# Patient Record
Sex: Female | Born: 1940
Health system: Southern US, Community
[De-identification: ages and names within clinical notes are randomized; demographics above are authoritative.]

## PROBLEM LIST (undated history)

## (undated) DIAGNOSIS — I5181 Takotsubo syndrome: Secondary | ICD-10-CM

## (undated) DIAGNOSIS — K5790 Diverticulosis of intestine, part unspecified, without perforation or abscess without bleeding: Secondary | ICD-10-CM

## (undated) DIAGNOSIS — F0390 Unspecified dementia without behavioral disturbance: Secondary | ICD-10-CM

## (undated) DIAGNOSIS — K279 Peptic ulcer, site unspecified, unspecified as acute or chronic, without hemorrhage or perforation: Secondary | ICD-10-CM

## (undated) DIAGNOSIS — I219 Acute myocardial infarction, unspecified: Secondary | ICD-10-CM

## (undated) DIAGNOSIS — B029 Zoster without complications: Secondary | ICD-10-CM

## (undated) DIAGNOSIS — M1990 Unspecified osteoarthritis, unspecified site: Secondary | ICD-10-CM

## (undated) DIAGNOSIS — M549 Dorsalgia, unspecified: Secondary | ICD-10-CM

## (undated) DIAGNOSIS — M722 Plantar fascial fibromatosis: Secondary | ICD-10-CM

## (undated) DIAGNOSIS — F32A Depression, unspecified: Secondary | ICD-10-CM

## (undated) DIAGNOSIS — K635 Polyp of colon: Secondary | ICD-10-CM

## (undated) DIAGNOSIS — K219 Gastro-esophageal reflux disease without esophagitis: Secondary | ICD-10-CM

## (undated) DIAGNOSIS — N3281 Overactive bladder: Secondary | ICD-10-CM

## (undated) DIAGNOSIS — R251 Tremor, unspecified: Secondary | ICD-10-CM

## (undated) DIAGNOSIS — F419 Anxiety disorder, unspecified: Secondary | ICD-10-CM

## (undated) DIAGNOSIS — E785 Hyperlipidemia, unspecified: Secondary | ICD-10-CM

## (undated) DIAGNOSIS — F329 Major depressive disorder, single episode, unspecified: Secondary | ICD-10-CM

## (undated) DIAGNOSIS — I639 Cerebral infarction, unspecified: Secondary | ICD-10-CM

## (undated) DIAGNOSIS — M719 Bursopathy, unspecified: Secondary | ICD-10-CM

## (undated) DIAGNOSIS — M8580 Other specified disorders of bone density and structure, unspecified site: Secondary | ICD-10-CM

## (undated) DIAGNOSIS — K649 Unspecified hemorrhoids: Secondary | ICD-10-CM

## (undated) DIAGNOSIS — I10 Essential (primary) hypertension: Secondary | ICD-10-CM

## (undated) DIAGNOSIS — H8110 Benign paroxysmal vertigo, unspecified ear: Secondary | ICD-10-CM

## (undated) DIAGNOSIS — E559 Vitamin D deficiency, unspecified: Secondary | ICD-10-CM

## (undated) HISTORY — DX: Vitamin D deficiency, unspecified: E55.9

## (undated) HISTORY — DX: Dorsalgia, unspecified: M54.9

## (undated) HISTORY — DX: Bursopathy, unspecified: M71.9

## (undated) HISTORY — PX: FOOT SURGERY: SHX648

## (undated) HISTORY — DX: Anxiety disorder, unspecified: F41.9

## (undated) HISTORY — DX: Polyp of colon: K63.5

## (undated) HISTORY — PX: JOINT REPLACEMENT: SHX530

## (undated) HISTORY — DX: Essential (primary) hypertension: I10

## (undated) HISTORY — DX: Overactive bladder: N32.81

## (undated) HISTORY — PX: APPENDECTOMY: SHX54

## (undated) HISTORY — PX: POLYPECTOMY: SHX149

## (undated) HISTORY — PX: ROTATOR CUFF REPAIR: SHX139

## (undated) HISTORY — PX: HEMORRHOID SURGERY: SHX153

## (undated) HISTORY — PX: TONSILLECTOMY: SUR1361

## (undated) HISTORY — PX: COLONOSCOPY: SHX174

## (undated) HISTORY — DX: Diverticulosis of intestine, part unspecified, without perforation or abscess without bleeding: K57.90

## (undated) HISTORY — DX: Unspecified hemorrhoids: K64.9

## (undated) HISTORY — PX: OTHER SURGICAL HISTORY: SHX169

## (undated) HISTORY — DX: Other specified disorders of bone density and structure, unspecified site: M85.80

## (undated) HISTORY — DX: Peptic ulcer, site unspecified, unspecified as acute or chronic, without hemorrhage or perforation: K27.9

## (undated) HISTORY — PX: ABDOMINAL HYSTERECTOMY: SHX81

## (undated) HISTORY — DX: Tremor, unspecified: R25.1

## (undated) HISTORY — DX: Zoster without complications: B02.9

## (undated) HISTORY — PX: VAGOTOMY: SUR1431

## (undated) HISTORY — PX: BACK SURGERY: SHX140

## (undated) HISTORY — PX: TUBAL LIGATION: SHX77

---
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
## 2000-05-11 ENCOUNTER — Encounter: Payer: Self-pay | Admitting: Emergency Medicine

## 2000-05-11 ENCOUNTER — Emergency Department (HOSPITAL_COMMUNITY): Admission: EM | Admit: 2000-05-11 | Discharge: 2000-05-11 | Payer: Self-pay | Admitting: Emergency Medicine

## 2002-11-08 ENCOUNTER — Encounter: Payer: Self-pay | Admitting: Internal Medicine

## 2002-11-08 ENCOUNTER — Encounter: Admission: RE | Admit: 2002-11-08 | Discharge: 2002-11-08 | Payer: Self-pay | Admitting: Internal Medicine

## 2003-06-29 ENCOUNTER — Encounter: Payer: Self-pay | Admitting: Physician Assistant

## 2003-11-10 ENCOUNTER — Encounter: Admission: RE | Admit: 2003-11-10 | Discharge: 2003-11-10 | Payer: Self-pay | Admitting: Internal Medicine

## 2005-12-13 ENCOUNTER — Encounter: Admission: RE | Admit: 2005-12-13 | Discharge: 2005-12-13 | Payer: Self-pay | Admitting: Internal Medicine

## 2007-06-29 ENCOUNTER — Encounter: Admission: RE | Admit: 2007-06-29 | Discharge: 2007-06-29 | Payer: Self-pay | Admitting: Internal Medicine

## 2009-03-03 ENCOUNTER — Encounter: Admission: RE | Admit: 2009-03-03 | Discharge: 2009-03-03 | Payer: Self-pay | Admitting: Internal Medicine

## 2009-07-18 ENCOUNTER — Ambulatory Visit: Payer: Self-pay | Admitting: Internal Medicine

## 2009-07-25 ENCOUNTER — Telehealth: Payer: Self-pay | Admitting: Internal Medicine

## 2009-07-27 ENCOUNTER — Telehealth: Payer: Self-pay | Admitting: Internal Medicine

## 2009-07-31 ENCOUNTER — Ambulatory Visit: Payer: Self-pay | Admitting: Internal Medicine

## 2009-07-31 ENCOUNTER — Ambulatory Visit (HOSPITAL_COMMUNITY): Admission: RE | Admit: 2009-07-31 | Discharge: 2009-07-31 | Payer: Self-pay | Admitting: Internal Medicine

## 2009-07-31 DIAGNOSIS — Z860100 Personal history of colon polyps, unspecified: Secondary | ICD-10-CM | POA: Insufficient documentation

## 2009-07-31 DIAGNOSIS — R112 Nausea with vomiting, unspecified: Secondary | ICD-10-CM | POA: Insufficient documentation

## 2009-07-31 DIAGNOSIS — R1013 Epigastric pain: Secondary | ICD-10-CM | POA: Insufficient documentation

## 2009-07-31 DIAGNOSIS — K644 Residual hemorrhoidal skin tags: Secondary | ICD-10-CM | POA: Insufficient documentation

## 2009-07-31 DIAGNOSIS — K648 Other hemorrhoids: Secondary | ICD-10-CM | POA: Insufficient documentation

## 2009-07-31 DIAGNOSIS — K625 Hemorrhage of anus and rectum: Secondary | ICD-10-CM | POA: Insufficient documentation

## 2009-07-31 DIAGNOSIS — K5730 Diverticulosis of large intestine without perforation or abscess without bleeding: Secondary | ICD-10-CM | POA: Insufficient documentation

## 2009-08-01 ENCOUNTER — Encounter: Payer: Self-pay | Admitting: Internal Medicine

## 2009-08-01 ENCOUNTER — Telehealth: Payer: Self-pay | Admitting: Internal Medicine

## 2009-08-01 DIAGNOSIS — R110 Nausea: Secondary | ICD-10-CM

## 2009-08-01 LAB — CONVERTED CEMR LAB
Basophils Absolute: 0.1 10*3/uL (ref 0.0–0.1)
Chloride: 99 meq/L (ref 96–112)
GFR calc non Af Amer: 66.33 mL/min (ref >60)
HCT: 42.4 % (ref 36.0–46.0)
Hemoglobin: 14.5 g/dL (ref 12.0–15.0)
Lymphocytes Relative: 34.6 % (ref 12.0–46.0)
Lymphs Abs: 2.5 10*3/uL (ref 0.7–4.0)
Platelets: 257 10*3/uL (ref 150.0–400.0)
Potassium: 4.1 meq/L (ref 3.5–5.1)
RBC: 4.55 M/uL (ref 3.87–5.11)
Sodium: 136 meq/L (ref 135–145)
WBC: 7.2 10*3/uL (ref 4.5–10.5)

## 2009-08-02 ENCOUNTER — Telehealth: Payer: Self-pay | Admitting: Physician Assistant

## 2009-08-08 ENCOUNTER — Telehealth (INDEPENDENT_AMBULATORY_CARE_PROVIDER_SITE_OTHER): Payer: Self-pay | Admitting: *Deleted

## 2009-08-15 ENCOUNTER — Ambulatory Visit (HOSPITAL_COMMUNITY): Admission: RE | Admit: 2009-08-15 | Discharge: 2009-08-15 | Payer: Self-pay | Admitting: Internal Medicine

## 2009-10-23 ENCOUNTER — Encounter: Payer: Self-pay | Admitting: Internal Medicine

## 2009-11-06 ENCOUNTER — Encounter (INDEPENDENT_AMBULATORY_CARE_PROVIDER_SITE_OTHER): Payer: Self-pay

## 2009-11-09 ENCOUNTER — Ambulatory Visit: Payer: Self-pay | Admitting: Internal Medicine

## 2009-11-23 ENCOUNTER — Ambulatory Visit: Payer: Self-pay | Admitting: Internal Medicine

## 2009-11-24 ENCOUNTER — Encounter: Payer: Self-pay | Admitting: Internal Medicine

## 2009-12-06 ENCOUNTER — Encounter: Payer: Self-pay | Admitting: Internal Medicine

## 2009-12-06 ENCOUNTER — Ambulatory Visit (HOSPITAL_COMMUNITY): Admission: RE | Admit: 2009-12-06 | Discharge: 2009-12-07 | Payer: Self-pay | Admitting: General Surgery

## 2009-12-06 ENCOUNTER — Encounter (INDEPENDENT_AMBULATORY_CARE_PROVIDER_SITE_OTHER): Payer: Self-pay | Admitting: General Surgery

## 2010-01-01 ENCOUNTER — Encounter: Payer: Self-pay | Admitting: Internal Medicine

## 2010-11-11 ENCOUNTER — Encounter: Payer: Self-pay | Admitting: Internal Medicine

## 2010-11-20 NOTE — Op Note (Signed)
Summary: [facility] Surgery   Imported By: Lester [place] 12/14/2009 09:12:51  _____________________________________________________________________  External Attachment:    Type:   Image     Comment:   External Document

## 2010-11-20 NOTE — Procedures (Signed)
Summary: Colonoscopy  Patient: [name] Note: All result statuses are Final unless otherwise noted.  Tests: (1) Colonoscopy (COL)   COL Colonoscopy           DONE      [facility]     [address]     [place], [place]  [address]           COLONOSCOPY PROCEDURE REPORT           PATIENT:  Colleen Glenn, Colleen Glenn  MR#:  [id number]     BIRTHDATE:  1941/02/13, 68 yrs. old  GENDER:  female           ENDOSCOPIST:  Wilhemina Bonito. Eda Keys, MD     Referred by:  Surveillance Program Recall           PROCEDURE DATE:  11/23/2009     PROCEDURE:  Colonoscopy with snare polypectomy x 2     ASA CLASS:  Class II     INDICATIONS:  history of polyps (index 06-2003 w/hp and ? type)           MEDICATIONS:   Fentanyl 100 mcg IV, Versed 10 mg IV           DESCRIPTION OF PROCEDURE:   After the risks benefits and     alternatives of the procedure were thoroughly explained, informed     consent was obtained.  Digital rectal exam was performed and     revealed prolasped hemorrhoids.   The LB CF-H180AL [id number]     endoscope was introduced through the anus and advanced to the     cecum, which was identified by both the appendix and ileocecal     valve, without limitations.Time to cecum = 5:41 min.  The quality     of the prep was excellent, using MoviPrep.  The instrument was     then slowly withdrawn (time = 14:37 min) as the colon was fully     examined.     <<PROCEDUREIMAGES>>           FINDINGS:  Two polyps were found in the ascending colon (5mm and     2mm). Polyps were snared without cautery. Retrieval was     successful.   Mild diverticulosis was found in the sigmoid colon.     Retroflexed views in the rectum revealed internal hemorrhoids.     The scope was then withdrawn from the patient and the procedure     completed.           COMPLICATIONS:  None           ENDOSCOPIC IMPRESSION:     1) Two polyps in the ascending colon -removed     2) Mild diverticulosis in the sigmoid colon     3)  Internal hemorrhoids           RECOMMENDATIONS:     1) Follow up colonoscopy in 5 years           ______________________________     Wilhemina Bonito. Eda Keys, MD           CC:  Rodrigo Ran, MD; The Patient           n.     eSIGNED:   Wilhemina Bonito. Eda Keys at 11/23/2009 10:55 AM           Leanna Battles, [id number]  Note: An exclamation mark (!) indicates a result that was not dispersed into the flowsheet. Document Creation Date: 11/23/2009 10:55  AM _______________________________________________________________________  (1) Order result status: Final Collection or observation date-time: 11/23/2009 10:47 Requested date-time:  Receipt date-time:  Reported date-time:  Referring Physician:   Ordering Physician: Fransico Setters [phone]) Specimen Source:  Source: Launa Grill Order Number: [id number] Lab site:   Appended Document: Colonoscopy recall     Procedures Next Due Date:    Colonoscopy: 11/2014

## 2010-11-20 NOTE — Letter (Signed)
Summary: [facility] Surgery   Imported By: Sherian Rein 01/10/2010 13:53:08  _____________________________________________________________________  External Attachment:    Type:   Image     Comment:   External Document

## 2010-11-20 NOTE — Letter (Signed)
Summary: Bleeding Hemorrhoids/CCS  Bleeding Hemorrhoids/CCS   Imported By: Sherian Rein 11/08/2009 08:08:19  _____________________________________________________________________  External Attachment:    Type:   Image     Comment:   External Document

## 2010-11-20 NOTE — Letter (Signed)
Summary: Patient Notice- Polyp Results  [facility]  [address] [place], [place] [address]   Phone: [phone]  Fax: [phone]        November 24, 2009 MRN: [id number]    [facility] [address] [place], [place]  [address]    Dear Ms. Boffa,  I am pleased to inform you that the colon polyp(s) removed during your recent colonoscopy was (were) found to be benign (no cancer detected) upon pathologic examination.  I recommend you have a repeat colonoscopy examination in 5 years to look for recurrent polyps, as having colon polyps increases your risk for having recurrent polyps or even colon cancer in the future.  Should you develop new or worsening symptoms of abdominal pain, bowel habit changes or bleeding from the rectum or bowels, please schedule an evaluation with either your primary care physician or with me.  Additional information/recommendations:  __ No further action with gastroenterology is needed at this time. Please      follow-up with your primary care physician for your other healthcare      needs.   Please call us if you are having persistent problems or have questions about your condition that have not been fully answered at this time.  Sincerely,  Hilarie Fredrickson MD  This letter has been electronically signed by your physician.  Appended Document: Patient Notice- Polyp Results Letter mailed 2.10.11

## 2010-11-20 NOTE — Letter (Signed)
Summary: [facility] Instructions  [facility]  [address] [place], [place] [address]   Phone: [phone]  Fax: [phone]       Colleen Glenn    12/27/1940    MRN: [id number]        Procedure Day /Date:  Thursday 11/23/2009     Arrival Time: 9:30 am      Procedure Time: 10:30 am     Location of Procedure:                    _x _  [facility] (4th Floor)  PREPARATION FOR COLONOSCOPY WITH MOVIPREP   Starting 5 days prior to your procedure Saturday 1/29 do not eat nuts, seeds, popcorn, corn, beans, peas,  salads, or any raw vegetables.  Do not take any fiber supplements (e.g. Metamucil, Citrucel, and Benefiber).  THE DAY BEFORE YOUR PROCEDURE         DATE: Wednesday 2/2  1.  Drink clear liquids the entire day-NO SOLID FOOD  2.  Do not drink anything colored red or purple.  Avoid juices with pulp.  No orange juice.  3.  Drink at least 64 oz. (8 glasses) of fluid/clear liquids during the day to prevent dehydration and help the prep work efficiently.  CLEAR LIQUIDS INCLUDE: Water Jello Ice Popsicles Tea (sugar ok, no milk/cream) Powdered fruit flavored drinks Coffee (sugar ok, no milk/cream) Gatorade Juice: apple, white grape, white cranberry  Lemonade Clear bullion, consomm, broth Carbonated beverages (any kind) Strained chicken noodle soup Hard Candy                             4.  In the morning, mix first dose of MoviPrep solution:    Empty 1 Pouch A and 1 Pouch B into the disposable container    Add lukewarm drinking water to the top line of the container. Mix to dissolve    Refrigerate (mixed solution should be used within 24 hrs)  5.  Begin drinking the prep at 5:00 p.m. The MoviPrep container is divided by 4 marks.   Every 15 minutes drink the solution down to the next mark (approximately 8 oz) until the full liter is complete.   6.  Follow completed prep with 16 oz of clear liquid of your choice (Nothing red or purple).   Continue to drink clear liquids until bedtime.  7.  Before going to bed, mix second dose of MoviPrep solution:    Empty 1 Pouch A and 1 Pouch B into the disposable container    Add lukewarm drinking water to the top line of the container. Mix to dissolve    Refrigerate  THE DAY OF YOUR PROCEDURE      DATE: Thursday 2/3  Beginning at 5:30 am (5 hours before procedure):         1. Every 15 minutes, drink the solution down to the next mark (approx 8 oz) until the full liter is complete.  2. Follow completed prep with 16 oz. of clear liquid of your choice.    3. You may drink clear liquids until 8:30 am (2 HOURS BEFORE PROCEDURE).   MEDICATION INSTRUCTIONS  Unless otherwise instructed, you should take regular prescription medications with a small sip of water   as early as possible the morning of your procedure.      Additional medication instructions: _Hold Maxzide the AM of procedure  OTHER INSTRUCTIONS  You will need a responsible adult at least 69 years of age to accompany you and drive you home.   This person must remain in the waiting room during your procedure.  Wear loose fitting clothing that is easily removed.  Leave jewelry and other valuables at home.  However, you may wish to bring a book to read or  an iPod/MP3 player to listen to music as you wait for your procedure to start.  Remove all body piercing jewelry and leave at home.  Total time from sign-in until discharge is approximately 2-3 hours.  You should go home directly after your procedure and rest.  You can resume normal activities the  day after your procedure.  The day of your procedure you should not:   Drive   Make legal decisions   Operate machinery   Drink alcohol   Return to work  You will receive specific instructions about eating, activities and medications before you leave.    The above instructions have been reviewed and explained to me by   Doristine Church RN II   November 09, 2009 2:40 PM     I fully understand and can verbalize these instructions _____________________________ Date _________

## 2010-11-20 NOTE — Miscellaneous (Signed)
Summary: [facility] previsit-prep  Clinical Lists Changes  Observations: Added new observation of ALLERGY REV: Done (11/09/2009 14:13) Added new observation of NKA: T (11/09/2009 14:13)

## 2011-01-09 LAB — COMPREHENSIVE METABOLIC PANEL
AST: 25 U/L (ref 0–37)
Albumin: 3.7 g/dL (ref 3.5–5.2)
BUN: 16 mg/dL (ref 6–23)
CO2: 27 mEq/L (ref 19–32)
Creatinine, Ser: 0.78 mg/dL (ref 0.4–1.2)
Glucose, Bld: 85 mg/dL (ref 70–99)
Sodium: 142 mEq/L (ref 135–145)
Total Bilirubin: 0.5 mg/dL (ref 0.3–1.2)

## 2011-01-09 LAB — DIFFERENTIAL
Basophils Relative: 0 % (ref 0–1)
Eosinophils Relative: 3 % (ref 0–5)
Lymphocytes Relative: 46 % (ref 12–46)
Neutro Abs: 3.1 10*3/uL (ref 1.7–7.7)

## 2011-01-09 LAB — CBC
Hemoglobin: 13.5 g/dL (ref 12.0–15.0)
MCHC: 34 g/dL (ref 30.0–36.0)
MCV: 94 fL (ref 78.0–100.0)
Platelets: 232 10*3/uL (ref 150–400)
RDW: 14.2 % (ref 11.5–15.5)
WBC: 7 10*3/uL (ref 4.0–10.5)

## 2011-01-09 LAB — URINALYSIS, ROUTINE W REFLEX MICROSCOPIC
Glucose, UA: NEGATIVE mg/dL
Specific Gravity, Urine: 1.017 (ref 1.005–1.030)
pH: 7.5 (ref 5.0–8.0)

## 2011-04-26 ENCOUNTER — Other Ambulatory Visit: Payer: Self-pay | Admitting: Internal Medicine

## 2011-04-26 DIAGNOSIS — M5410 Radiculopathy, site unspecified: Secondary | ICD-10-CM

## 2011-04-26 DIAGNOSIS — M545 Low back pain: Secondary | ICD-10-CM

## 2011-05-02 ENCOUNTER — Ambulatory Visit
Admission: RE | Admit: 2011-05-02 | Discharge: 2011-05-02 | Disposition: A | Discharge status: Home / Self Care | Payer: Medicare Other | Source: Ambulatory Visit | Attending: Internal Medicine | Admitting: Internal Medicine

## 2011-05-02 DIAGNOSIS — M545 Low back pain: Secondary | ICD-10-CM

## 2011-05-02 DIAGNOSIS — M5410 Radiculopathy, site unspecified: Secondary | ICD-10-CM

## 2011-05-02 MED ORDER — GADOBENATE DIMEGLUMINE 529 MG/ML IV SOLN
15.0000 mL | Freq: Once | INTRAVENOUS | Status: AC | PRN
  Administered 2011-05-02: 15 mL via INTRAVENOUS

## 2011-10-24 DIAGNOSIS — M19019 Primary osteoarthritis, unspecified shoulder: Secondary | ICD-10-CM | POA: Diagnosis not present

## 2011-10-29 DIAGNOSIS — M75120 Complete rotator cuff tear or rupture of unspecified shoulder, not specified as traumatic: Secondary | ICD-10-CM | POA: Diagnosis not present

## 2011-11-18 DIAGNOSIS — Y929 Unspecified place or not applicable: Secondary | ICD-10-CM | POA: Diagnosis not present

## 2011-11-18 DIAGNOSIS — M19019 Primary osteoarthritis, unspecified shoulder: Secondary | ICD-10-CM | POA: Diagnosis not present

## 2011-11-18 DIAGNOSIS — M948X9 Other specified disorders of cartilage, unspecified sites: Secondary | ICD-10-CM | POA: Diagnosis not present

## 2011-11-18 DIAGNOSIS — S46819A Strain of other muscles, fascia and tendons at shoulder and upper arm level, unspecified arm, initial encounter: Secondary | ICD-10-CM | POA: Diagnosis not present

## 2011-11-18 DIAGNOSIS — M75120 Complete rotator cuff tear or rupture of unspecified shoulder, not specified as traumatic: Secondary | ICD-10-CM | POA: Diagnosis not present

## 2011-11-18 DIAGNOSIS — S46919A Strain of unspecified muscle, fascia and tendon at shoulder and upper arm level, unspecified arm, initial encounter: Secondary | ICD-10-CM | POA: Diagnosis not present

## 2011-11-18 DIAGNOSIS — X58XXXA Exposure to other specified factors, initial encounter: Secondary | ICD-10-CM | POA: Diagnosis not present

## 2011-11-18 DIAGNOSIS — M75100 Unspecified rotator cuff tear or rupture of unspecified shoulder, not specified as traumatic: Secondary | ICD-10-CM | POA: Diagnosis not present

## 2011-11-26 DIAGNOSIS — Z4789 Encounter for other orthopedic aftercare: Secondary | ICD-10-CM | POA: Diagnosis not present

## 2011-11-26 DIAGNOSIS — M19019 Primary osteoarthritis, unspecified shoulder: Secondary | ICD-10-CM | POA: Diagnosis not present

## 2011-11-26 DIAGNOSIS — M75120 Complete rotator cuff tear or rupture of unspecified shoulder, not specified as traumatic: Secondary | ICD-10-CM | POA: Diagnosis not present

## 2011-12-09 DIAGNOSIS — M75120 Complete rotator cuff tear or rupture of unspecified shoulder, not specified as traumatic: Secondary | ICD-10-CM | POA: Diagnosis not present

## 2011-12-12 DIAGNOSIS — M75120 Complete rotator cuff tear or rupture of unspecified shoulder, not specified as traumatic: Secondary | ICD-10-CM | POA: Diagnosis not present

## 2011-12-20 DIAGNOSIS — M75120 Complete rotator cuff tear or rupture of unspecified shoulder, not specified as traumatic: Secondary | ICD-10-CM | POA: Diagnosis not present

## 2011-12-24 DIAGNOSIS — M75120 Complete rotator cuff tear or rupture of unspecified shoulder, not specified as traumatic: Secondary | ICD-10-CM | POA: Diagnosis not present

## 2011-12-26 DIAGNOSIS — M75120 Complete rotator cuff tear or rupture of unspecified shoulder, not specified as traumatic: Secondary | ICD-10-CM | POA: Diagnosis not present

## 2011-12-30 DIAGNOSIS — M75120 Complete rotator cuff tear or rupture of unspecified shoulder, not specified as traumatic: Secondary | ICD-10-CM | POA: Diagnosis not present

## 2012-01-02 DIAGNOSIS — M75120 Complete rotator cuff tear or rupture of unspecified shoulder, not specified as traumatic: Secondary | ICD-10-CM | POA: Diagnosis not present

## 2012-01-08 DIAGNOSIS — M75120 Complete rotator cuff tear or rupture of unspecified shoulder, not specified as traumatic: Secondary | ICD-10-CM | POA: Diagnosis not present

## 2012-01-10 DIAGNOSIS — M75120 Complete rotator cuff tear or rupture of unspecified shoulder, not specified as traumatic: Secondary | ICD-10-CM | POA: Diagnosis not present

## 2012-01-13 DIAGNOSIS — M75120 Complete rotator cuff tear or rupture of unspecified shoulder, not specified as traumatic: Secondary | ICD-10-CM | POA: Diagnosis not present

## 2012-01-16 DIAGNOSIS — M75120 Complete rotator cuff tear or rupture of unspecified shoulder, not specified as traumatic: Secondary | ICD-10-CM | POA: Diagnosis not present

## 2012-01-20 DIAGNOSIS — M75120 Complete rotator cuff tear or rupture of unspecified shoulder, not specified as traumatic: Secondary | ICD-10-CM | POA: Diagnosis not present

## 2012-01-22 DIAGNOSIS — M75120 Complete rotator cuff tear or rupture of unspecified shoulder, not specified as traumatic: Secondary | ICD-10-CM | POA: Diagnosis not present

## 2012-01-29 DIAGNOSIS — M75120 Complete rotator cuff tear or rupture of unspecified shoulder, not specified as traumatic: Secondary | ICD-10-CM | POA: Diagnosis not present

## 2012-02-03 DIAGNOSIS — M75120 Complete rotator cuff tear or rupture of unspecified shoulder, not specified as traumatic: Secondary | ICD-10-CM | POA: Diagnosis not present

## 2012-02-05 DIAGNOSIS — M75120 Complete rotator cuff tear or rupture of unspecified shoulder, not specified as traumatic: Secondary | ICD-10-CM | POA: Diagnosis not present

## 2012-02-10 DIAGNOSIS — M75120 Complete rotator cuff tear or rupture of unspecified shoulder, not specified as traumatic: Secondary | ICD-10-CM | POA: Diagnosis not present

## 2012-02-12 DIAGNOSIS — M75120 Complete rotator cuff tear or rupture of unspecified shoulder, not specified as traumatic: Secondary | ICD-10-CM | POA: Diagnosis not present

## 2012-02-19 DIAGNOSIS — M75120 Complete rotator cuff tear or rupture of unspecified shoulder, not specified as traumatic: Secondary | ICD-10-CM | POA: Diagnosis not present

## 2012-02-24 DIAGNOSIS — M75120 Complete rotator cuff tear or rupture of unspecified shoulder, not specified as traumatic: Secondary | ICD-10-CM | POA: Diagnosis not present

## 2012-02-26 DIAGNOSIS — M75120 Complete rotator cuff tear or rupture of unspecified shoulder, not specified as traumatic: Secondary | ICD-10-CM | POA: Diagnosis not present

## 2012-03-02 DIAGNOSIS — M75120 Complete rotator cuff tear or rupture of unspecified shoulder, not specified as traumatic: Secondary | ICD-10-CM | POA: Diagnosis not present

## 2012-03-04 DIAGNOSIS — M75120 Complete rotator cuff tear or rupture of unspecified shoulder, not specified as traumatic: Secondary | ICD-10-CM | POA: Diagnosis not present

## 2012-03-24 DIAGNOSIS — M19019 Primary osteoarthritis, unspecified shoulder: Secondary | ICD-10-CM | POA: Diagnosis not present

## 2012-03-24 DIAGNOSIS — M75120 Complete rotator cuff tear or rupture of unspecified shoulder, not specified as traumatic: Secondary | ICD-10-CM | POA: Diagnosis not present

## 2012-03-24 DIAGNOSIS — Z4789 Encounter for other orthopedic aftercare: Secondary | ICD-10-CM | POA: Diagnosis not present

## 2012-04-17 ENCOUNTER — Other Ambulatory Visit: Payer: Self-pay | Admitting: Neurological Surgery

## 2012-04-17 DIAGNOSIS — IMO0002 Reserved for concepts with insufficient information to code with codable children: Secondary | ICD-10-CM | POA: Diagnosis not present

## 2012-04-25 ENCOUNTER — Ambulatory Visit
Admission: RE | Admit: 2012-04-25 | Discharge: 2012-04-25 | Disposition: A | Discharge status: Home / Self Care | Payer: Medicare Other | Source: Ambulatory Visit | Attending: Neurological Surgery | Admitting: Neurological Surgery

## 2012-04-25 DIAGNOSIS — M5137 Other intervertebral disc degeneration, lumbosacral region: Secondary | ICD-10-CM | POA: Diagnosis not present

## 2012-04-25 DIAGNOSIS — M47817 Spondylosis without myelopathy or radiculopathy, lumbosacral region: Secondary | ICD-10-CM | POA: Diagnosis not present

## 2012-04-25 DIAGNOSIS — IMO0002 Reserved for concepts with insufficient information to code with codable children: Secondary | ICD-10-CM

## 2012-04-25 DIAGNOSIS — M5126 Other intervertebral disc displacement, lumbar region: Secondary | ICD-10-CM | POA: Diagnosis not present

## 2012-05-01 ENCOUNTER — Other Ambulatory Visit: Payer: Self-pay | Admitting: Neurological Surgery

## 2012-05-01 DIAGNOSIS — M48061 Spinal stenosis, lumbar region without neurogenic claudication: Secondary | ICD-10-CM | POA: Diagnosis not present

## 2012-05-04 ENCOUNTER — Encounter (HOSPITAL_COMMUNITY): Payer: Self-pay | Admitting: Pharmacy Technician

## 2012-05-04 NOTE — Pre-Procedure Instructions (Addendum)
20 Colleen Glenn   05/04/2012   Your procedure is scheduled on: July 18th, Thursday   Report to [facility] at  8:45 AM.   Call this number if you have problems the morning of surgery: [phone]   Remember:   Do not eat food or drink any liquids:After Midnight Wednesday.    Take these medicines the morning of surgery with a SIP OF WATER: Omeprazole,              Wellbutrin                                                                                                            Do not wear jewelry, make-up or nail polish.   Do not wear lotions, powders, or perfumes. Do not shave 48 hrs prior               To surgery.                                                                                                                                                                                                                                    DO NOT BRING ANY VALUABLES TO THE HOSPITAL  Contacts, dentures or bridgework may not be worn into surgery.   Leave suitcase in the car. After surgery it may be brought to your room.  For patients admitted to the hospital, checkout time is 11:00 AM the day of discharge.   Patients discharged the day of surgery will not be allowed to drive home.  Name and phone number of your driver:  Special Instructions: CHG Shower Use Special Wash: 1/2 bottle night before surgery and 1/2 bottle morning of surgery.   Please read over the following fact sheets that you were given: Pain Booklet, MRSA Information and Surgical Site Infection Prevention

## 2012-05-05 ENCOUNTER — Encounter (HOSPITAL_COMMUNITY)
Admission: RE | Admit: 2012-05-05 | Discharge: 2012-05-05 | Disposition: A | Discharge status: Home / Self Care | Payer: Medicare Other | Source: Ambulatory Visit | Attending: Neurological Surgery | Admitting: Neurological Surgery

## 2012-05-05 ENCOUNTER — Encounter (HOSPITAL_COMMUNITY): Payer: Self-pay

## 2012-05-05 DIAGNOSIS — E785 Hyperlipidemia, unspecified: Secondary | ICD-10-CM | POA: Diagnosis not present

## 2012-05-05 DIAGNOSIS — Z01811 Encounter for preprocedural respiratory examination: Secondary | ICD-10-CM | POA: Diagnosis not present

## 2012-05-05 DIAGNOSIS — M48061 Spinal stenosis, lumbar region without neurogenic claudication: Secondary | ICD-10-CM | POA: Diagnosis not present

## 2012-05-05 DIAGNOSIS — M5126 Other intervertebral disc displacement, lumbar region: Secondary | ICD-10-CM | POA: Diagnosis not present

## 2012-05-05 DIAGNOSIS — K219 Gastro-esophageal reflux disease without esophagitis: Secondary | ICD-10-CM | POA: Diagnosis not present

## 2012-05-05 DIAGNOSIS — I10 Essential (primary) hypertension: Secondary | ICD-10-CM | POA: Diagnosis not present

## 2012-05-05 HISTORY — DX: Plantar fascial fibromatosis: M72.2

## 2012-05-05 HISTORY — DX: Gastro-esophageal reflux disease without esophagitis: K21.9

## 2012-05-05 HISTORY — DX: Depression, unspecified: F32.A

## 2012-05-05 HISTORY — DX: Unspecified osteoarthritis, unspecified site: M19.90

## 2012-05-05 HISTORY — DX: Major depressive disorder, single episode, unspecified: F32.9

## 2012-05-05 HISTORY — DX: Hyperlipidemia, unspecified: E78.5

## 2012-05-05 LAB — BASIC METABOLIC PANEL
CO2: 28 mEq/L (ref 19–32)
Calcium: 9.1 mg/dL (ref 8.4–10.5)
Creatinine, Ser: 0.74 mg/dL (ref 0.50–1.10)
Glucose, Bld: 103 mg/dL — ABNORMAL HIGH (ref 70–99)

## 2012-05-05 LAB — DIFFERENTIAL
Basophils Absolute: 0 10*3/uL (ref 0.0–0.1)
Basophils Relative: 0 % (ref 0–1)
Eosinophils Absolute: 0.2 10*3/uL (ref 0.0–0.7)
Eosinophils Relative: 2 % (ref 0–5)

## 2012-05-05 LAB — SURGICAL PCR SCREEN: Staphylococcus aureus: NEGATIVE

## 2012-05-05 LAB — CBC
MCH: 31.1 pg (ref 26.0–34.0)
MCV: 93.3 fL (ref 78.0–100.0)
Platelets: 266 10*3/uL (ref 150–400)
RDW: 13.8 % (ref 11.5–15.5)
WBC: 8 10*3/uL (ref 4.0–10.5)

## 2012-05-05 LAB — PROTIME-INR: Prothrombin Time: 13.3 seconds (ref 11.6–15.2)

## 2012-05-06 MED ORDER — CEFAZOLIN SODIUM-DEXTROSE 2-3 GM-% IV SOLR
2.0000 g | INTRAVENOUS | Status: AC
  Administered 2012-05-07: 2 g via INTRAVENOUS
  Filled 2012-05-06: qty 50

## 2012-05-07 ENCOUNTER — Inpatient Hospital Stay (HOSPITAL_COMMUNITY): Payer: Medicare Other

## 2012-05-07 ENCOUNTER — Encounter (HOSPITAL_COMMUNITY): Payer: Self-pay | Admitting: Anesthesiology

## 2012-05-07 ENCOUNTER — Inpatient Hospital Stay (HOSPITAL_COMMUNITY)
Admission: RE | Admit: 2012-05-07 | Discharge: 2012-05-07 | DRG: 491 | Disposition: A | Discharge status: Home / Self Care | Payer: Medicare Other | Source: Ambulatory Visit | Attending: Neurological Surgery | Admitting: Neurological Surgery

## 2012-05-07 ENCOUNTER — Encounter (HOSPITAL_COMMUNITY)
Admission: RE | Disposition: A | Discharge status: Home / Self Care | Payer: Self-pay | Source: Ambulatory Visit | Attending: Neurological Surgery

## 2012-05-07 ENCOUNTER — Encounter (HOSPITAL_COMMUNITY): Payer: Self-pay | Admitting: *Deleted

## 2012-05-07 ENCOUNTER — Inpatient Hospital Stay (HOSPITAL_COMMUNITY): Payer: Medicare Other | Admitting: Anesthesiology

## 2012-05-07 DIAGNOSIS — E785 Hyperlipidemia, unspecified: Secondary | ICD-10-CM | POA: Diagnosis not present

## 2012-05-07 DIAGNOSIS — Z87891 Personal history of nicotine dependence: Secondary | ICD-10-CM

## 2012-05-07 DIAGNOSIS — F329 Major depressive disorder, single episode, unspecified: Secondary | ICD-10-CM | POA: Diagnosis present

## 2012-05-07 DIAGNOSIS — I10 Essential (primary) hypertension: Secondary | ICD-10-CM | POA: Diagnosis present

## 2012-05-07 DIAGNOSIS — M48061 Spinal stenosis, lumbar region without neurogenic claudication: Secondary | ICD-10-CM | POA: Diagnosis not present

## 2012-05-07 DIAGNOSIS — Z7982 Long term (current) use of aspirin: Secondary | ICD-10-CM

## 2012-05-07 DIAGNOSIS — Z79899 Other long term (current) drug therapy: Secondary | ICD-10-CM

## 2012-05-07 DIAGNOSIS — F3289 Other specified depressive episodes: Secondary | ICD-10-CM | POA: Diagnosis present

## 2012-05-07 DIAGNOSIS — Z981 Arthrodesis status: Secondary | ICD-10-CM | POA: Diagnosis not present

## 2012-05-07 DIAGNOSIS — M79609 Pain in unspecified limb: Secondary | ICD-10-CM | POA: Diagnosis not present

## 2012-05-07 DIAGNOSIS — K219 Gastro-esophageal reflux disease without esophagitis: Secondary | ICD-10-CM | POA: Diagnosis not present

## 2012-05-07 HISTORY — PX: LUMBAR LAMINECTOMY/DECOMPRESSION MICRODISCECTOMY: SHX5026

## 2012-05-07 SURGERY — LUMBAR LAMINECTOMY/DECOMPRESSION MICRODISCECTOMY 1 LEVEL
Anesthesia: General | Site: Back | Laterality: Right | Wound class: Clean

## 2012-05-07 MED ORDER — BUPROPION HCL ER (XL) 300 MG PO TB24: 300.0000 mg | ORAL_TABLET | Freq: Every day | ORAL | Status: DC

## 2012-05-07 MED ORDER — BUPIVACAINE HCL (PF) 0.25 % IJ SOLN
INTRAMUSCULAR | Status: DC | PRN
  Administered 2012-05-07: 16 mL

## 2012-05-07 MED ORDER — MENTHOL 3 MG MT LOZG: 1.0000 | LOZENGE | OROMUCOSAL | Status: DC | PRN

## 2012-05-07 MED ORDER — HEMOSTATIC AGENTS (NO CHARGE) OPTIME
TOPICAL | Status: DC | PRN
  Administered 2012-05-07: 1 via TOPICAL

## 2012-05-07 MED ORDER — ONDANSETRON HCL 4 MG/2ML IJ SOLN
INTRAMUSCULAR | Status: DC | PRN
  Administered 2012-05-07: 4 mg via INTRAVENOUS

## 2012-05-07 MED ORDER — ACETAMINOPHEN 325 MG PO TABS: 650.0000 mg | ORAL_TABLET | ORAL | Status: DC | PRN

## 2012-05-07 MED ORDER — DEXAMETHASONE SODIUM PHOSPHATE 10 MG/ML IJ SOLN: 10.0000 mg | INTRAMUSCULAR | Status: DC

## 2012-05-07 MED ORDER — PHENOL 1.4 % MT LIQD: 1.0000 | OROMUCOSAL | Status: DC | PRN

## 2012-05-07 MED ORDER — ACETAMINOPHEN 10 MG/ML IV SOLN
INTRAVENOUS | Status: AC
  Administered 2012-05-07: 1000 mg via INTRAVENOUS
  Filled 2012-05-07: qty 100

## 2012-05-07 MED ORDER — FENTANYL CITRATE 0.05 MG/ML IJ SOLN
INTRAMUSCULAR | Status: DC | PRN
  Administered 2012-05-07: 175 ug via INTRAVENOUS

## 2012-05-07 MED ORDER — MIDAZOLAM HCL 5 MG/5ML IJ SOLN
INTRAMUSCULAR | Status: DC | PRN
  Administered 2012-05-07: 2 mg via INTRAVENOUS

## 2012-05-07 MED ORDER — ONDANSETRON HCL 4 MG/2ML IJ SOLN: 4.0000 mg | Freq: Once | INTRAMUSCULAR | Status: DC | PRN

## 2012-05-07 MED ORDER — MORPHINE SULFATE 2 MG/ML IJ SOLN: 1.0000 mg | INTRAMUSCULAR | Status: DC | PRN

## 2012-05-07 MED ORDER — GLYCOPYRROLATE 0.2 MG/ML IJ SOLN
INTRAMUSCULAR | Status: DC | PRN
  Administered 2012-05-07: 0.4 mg via INTRAVENOUS

## 2012-05-07 MED ORDER — LIDOCAINE HCL (CARDIAC) 20 MG/ML IV SOLN
INTRAVENOUS | Status: DC | PRN
  Administered 2012-05-07: 100 mg via INTRAVENOUS

## 2012-05-07 MED ORDER — NEOSTIGMINE METHYLSULFATE 1 MG/ML IJ SOLN
INTRAMUSCULAR | Status: DC | PRN
  Administered 2012-05-07: 3 mg via INTRAVENOUS

## 2012-05-07 MED ORDER — HYDROCODONE-ACETAMINOPHEN 5-325 MG PO TABS
1.0000 | ORAL_TABLET | ORAL | Status: DC | PRN
  Administered 2012-05-07: 2 via ORAL
  Filled 2012-05-07: qty 2

## 2012-05-07 MED ORDER — BACITRACIN 50000 UNITS IM SOLR
INTRAMUSCULAR | Status: AC
  Filled 2012-05-07: qty 1

## 2012-05-07 MED ORDER — CEFAZOLIN SODIUM 1-5 GM-% IV SOLN
1.0000 g | Freq: Three times a day (TID) | INTRAVENOUS | Status: DC
  Filled 2012-05-07 (×2): qty 50

## 2012-05-07 MED ORDER — SODIUM CHLORIDE 0.9 % IV SOLN
INTRAVENOUS | Status: AC
  Filled 2012-05-07: qty 500

## 2012-05-07 MED ORDER — ASPIRIN EC 81 MG PO TBEC
81.0000 mg | DELAYED_RELEASE_TABLET | Freq: Every day | ORAL | Status: DC
  Filled 2012-05-07: qty 1

## 2012-05-07 MED ORDER — ACETAMINOPHEN 650 MG RE SUPP: 650.0000 mg | RECTAL | Status: DC | PRN

## 2012-05-07 MED ORDER — PROPOFOL 10 MG/ML IV EMUL
INTRAVENOUS | Status: DC | PRN
  Administered 2012-05-07: 130 mg via INTRAVENOUS

## 2012-05-07 MED ORDER — SODIUM CHLORIDE 0.9 % IJ SOLN: 3.0000 mL | INTRAMUSCULAR | Status: DC | PRN

## 2012-05-07 MED ORDER — SODIUM CHLORIDE 0.9 % IR SOLN
Status: DC | PRN
  Administered 2012-05-07: 10:00:00

## 2012-05-07 MED ORDER — ONDANSETRON HCL 4 MG/2ML IJ SOLN: 4.0000 mg | INTRAMUSCULAR | Status: DC | PRN

## 2012-05-07 MED ORDER — THROMBIN 5000 UNITS EX SOLR
CUTANEOUS | Status: DC | PRN
  Administered 2012-05-07: 5000 [IU] via TOPICAL

## 2012-05-07 MED ORDER — ZOLPIDEM TARTRATE 5 MG PO TABS: 5.0000 mg | ORAL_TABLET | Freq: Every evening | ORAL | Status: DC | PRN

## 2012-05-07 MED ORDER — LACTATED RINGERS IV SOLN
INTRAVENOUS | Status: DC | PRN
  Administered 2012-05-07 (×2): via INTRAVENOUS

## 2012-05-07 MED ORDER — PHENYLEPHRINE HCL 10 MG/ML IJ SOLN
INTRAMUSCULAR | Status: DC | PRN
  Administered 2012-05-07 (×2): 80 ug via INTRAVENOUS

## 2012-05-07 MED ORDER — RALOXIFENE HCL 60 MG PO TABS
60.0000 mg | ORAL_TABLET | Freq: Every day | ORAL | Status: DC
  Filled 2012-05-07: qty 1

## 2012-05-07 MED ORDER — CYCLOBENZAPRINE HCL 10 MG PO TABS: 10.0000 mg | ORAL_TABLET | Freq: Three times a day (TID) | ORAL | Status: DC | PRN

## 2012-05-07 MED ORDER — DEXAMETHASONE SODIUM PHOSPHATE 10 MG/ML IJ SOLN
INTRAMUSCULAR | Status: AC
  Administered 2012-05-07: 10 mg via INTRAVENOUS
  Filled 2012-05-07: qty 1

## 2012-05-07 MED ORDER — EPHEDRINE SULFATE 50 MG/ML IJ SOLN
INTRAMUSCULAR | Status: DC | PRN
  Administered 2012-05-07: 10 mg via INTRAVENOUS
  Administered 2012-05-07: 5 mg via INTRAVENOUS

## 2012-05-07 MED ORDER — HYDROMORPHONE HCL PF 1 MG/ML IJ SOLN
0.2500 mg | INTRAMUSCULAR | Status: DC | PRN
  Administered 2012-05-07: 0.5 mg via INTRAVENOUS

## 2012-05-07 MED ORDER — ROCURONIUM BROMIDE 100 MG/10ML IV SOLN
INTRAVENOUS | Status: DC | PRN
  Administered 2012-05-07: 50 mg via INTRAVENOUS

## 2012-05-07 MED ORDER — TRIAMTERENE-HCTZ 37.5-25 MG PO TABS
1.0000 | ORAL_TABLET | Freq: Every day | ORAL | Status: DC
  Filled 2012-05-07: qty 1

## 2012-05-07 MED ORDER — HYDROMORPHONE HCL PF 1 MG/ML IJ SOLN
INTRAMUSCULAR | Status: AC
  Filled 2012-05-07: qty 1

## 2012-05-07 MED ORDER — SODIUM CHLORIDE 0.9 % IJ SOLN: 3.0000 mL | Freq: Two times a day (BID) | INTRAMUSCULAR | Status: DC

## 2012-05-07 MED ORDER — DEXTROSE 5 % IV SOLN
INTRAVENOUS | Status: DC | PRN
  Administered 2012-05-07: 10:00:00 via INTRAVENOUS

## 2012-05-07 MED ORDER — POTASSIUM CHLORIDE IN NACL 20-0.9 MEQ/L-% IV SOLN
INTRAVENOUS | Status: DC
  Filled 2012-05-07 (×2): qty 1000

## 2012-05-07 MED ORDER — 0.9 % SODIUM CHLORIDE (POUR BTL) OPTIME
TOPICAL | Status: DC | PRN
  Administered 2012-05-07: 1000 mL

## 2012-05-07 MED ORDER — SENNA 8.6 MG PO TABS: 1.0000 | ORAL_TABLET | Freq: Two times a day (BID) | ORAL | Status: DC

## 2012-05-07 SURGICAL SUPPLY — 48 items
APL SKNCLS STERI-STRIP NONHPOA (GAUZE/BANDAGES/DRESSINGS) ×1
BAG DECANTER FOR FLEXI CONT (MISCELLANEOUS) ×2 IMPLANT
BENZOIN TINCTURE PRP APPL 2/3 (GAUZE/BANDAGES/DRESSINGS) ×2 IMPLANT
BUR MATCHSTICK NEURO 3.0 LAGG (BURR) ×2 IMPLANT
CANISTER SUCTION 2500CC (MISCELLANEOUS) ×2 IMPLANT
CLOTH BEACON ORANGE TIMEOUT ST (SAFETY) ×2 IMPLANT
CONT SPEC 4OZ CLIKSEAL STRL BL (MISCELLANEOUS) ×2 IMPLANT
DRAPE LAPAROTOMY 100X72X124 (DRAPES) ×2 IMPLANT
DRAPE MICROSCOPE ZEISS OPMI (DRAPES) ×2 IMPLANT
DRAPE POUCH INSTRU U-SHP 10X18 (DRAPES) ×2 IMPLANT
DRAPE PROXIMA HALF (DRAPES) ×1 IMPLANT
DRAPE SURG 17X23 STRL (DRAPES) ×2 IMPLANT
DRESSING TELFA 8X3 (GAUZE/BANDAGES/DRESSINGS) ×2 IMPLANT
DRSG OPSITE 4X5.5 SM (GAUZE/BANDAGES/DRESSINGS) ×2 IMPLANT
DURAPREP 26ML APPLICATOR (WOUND CARE) ×2 IMPLANT
ELECT REM PT RETURN 9FT ADLT (ELECTROSURGICAL) ×2
ELECTRODE REM PT RTRN 9FT ADLT (ELECTROSURGICAL) ×1 IMPLANT
GAUZE SPONGE 4X4 16PLY XRAY LF (GAUZE/BANDAGES/DRESSINGS) IMPLANT
GLOVE BIO SURGEON STRL SZ8 (GLOVE) ×2 IMPLANT
GLOVE BIOGEL PI IND STRL 7.0 (GLOVE) IMPLANT
GLOVE BIOGEL PI INDICATOR 7.0 (GLOVE) ×2
GLOVE ECLIPSE 7.5 STRL STRAW (GLOVE) ×1 IMPLANT
GLOVE SS BIOGEL STRL SZ 6.5 (GLOVE) IMPLANT
GLOVE SUPERSENSE BIOGEL SZ 6.5 (GLOVE) ×2
GOWN BRE IMP SLV AUR LG STRL (GOWN DISPOSABLE) ×2 IMPLANT
GOWN BRE IMP SLV AUR XL STRL (GOWN DISPOSABLE) ×3 IMPLANT
GOWN STRL REIN 2XL LVL4 (GOWN DISPOSABLE) IMPLANT
HEMOSTAT POWDER KIT SURGIFOAM (HEMOSTASIS) IMPLANT
KIT BASIN OR (CUSTOM PROCEDURE TRAY) ×2 IMPLANT
KIT ROOM TURNOVER OR (KITS) ×2 IMPLANT
NDL HYPO 25X1 1.5 SAFETY (NEEDLE) ×1 IMPLANT
NDL SPNL 20GX3.5 QUINCKE YW (NEEDLE) IMPLANT
NEEDLE HYPO 25X1 1.5 SAFETY (NEEDLE) ×2 IMPLANT
NEEDLE SPNL 20GX3.5 QUINCKE YW (NEEDLE) ×2 IMPLANT
NS IRRIG 1000ML POUR BTL (IV SOLUTION) ×2 IMPLANT
PACK LAMINECTOMY NEURO (CUSTOM PROCEDURE TRAY) ×2 IMPLANT
PAD ARMBOARD 7.5X6 YLW CONV (MISCELLANEOUS) ×6 IMPLANT
RUBBERBAND STERILE (MISCELLANEOUS) ×4 IMPLANT
SPONGE SURGIFOAM ABS GEL SZ50 (HEMOSTASIS) ×2 IMPLANT
STRIP CLOSURE SKIN 1/2X4 (GAUZE/BANDAGES/DRESSINGS) ×2 IMPLANT
SUT VIC AB 0 CT1 18XCR BRD8 (SUTURE) ×1 IMPLANT
SUT VIC AB 0 CT1 8-18 (SUTURE) ×2
SUT VIC AB 2-0 CP2 18 (SUTURE) ×2 IMPLANT
SUT VIC AB 3-0 SH 8-18 (SUTURE) ×3 IMPLANT
SYR 20ML ECCENTRIC (SYRINGE) ×2 IMPLANT
TOWEL OR 17X24 6PK STRL BLUE (TOWEL DISPOSABLE) ×2 IMPLANT
TOWEL OR 17X26 10 PK STRL BLUE (TOWEL DISPOSABLE) ×2 IMPLANT
WATER STERILE IRR 1000ML POUR (IV SOLUTION) ×2 IMPLANT

## 2012-05-07 NOTE — Op Note (Signed)
05/07/2012  11:20 AM  PATIENT:  Colleen Glenn  71 y.o. female  PRE-OPERATIVE DIAGNOSIS:  Lumbar stenosis L3-4 with right leg pain  POST-OPERATIVE DIAGNOSIS:  Same  PROCEDURE:  Right L3-4 hemilaminectomy, medial facetectomy, and foraminotomies for decompression of the right L4 nerve root  SURGEON:  Marikay Alar, MD  ASSISTANTS: Dr. Phoebe Perch  ANESTHESIA:   General  EBL: 20 ml  Total I/O In: 1150 [I.V.:1150] Out: 20 [Blood:20]  BLOOD ADMINISTERED:none  DRAINS: None   SPECIMEN:  No Specimen  INDICATION FOR PROCEDURE: This patient present with right leg pain in the quadricep. She had an MRI which showed spinal stenosis at L3-4. She tried medical management without relief. Recommended decompressive hemilaminectomy at L4 on the right. Patient understood the risks, benefits, and alternatives and potential outcomes and wished to proceed.  PROCEDURE DETAILS: The patient was taken to the operating room and after induction of adequate generalized endotracheal anesthesia, the patient was rolled into the prone position on the Wilson frame and all pressure points were padded. The lumbar region was cleaned and then prepped with DuraPrep and draped in the usual sterile fashion. 5 cc of local anesthesia was injected and then a dorsal midline incision was made and carried down to the lumbo sacral fascia. The fascia was opened and the paraspinous musculature was taken down in a subperiosteal fashion to expose L3-4 on the right. Intraoperative x-ray confirmed my level, and then I used a combination of the high-speed drill and the Kerrison punches to perform a hemilaminectomy, medial facetectomy, and foraminotomy at L3-4 on the right. The underlying yellow ligament was opened and removed in a piecemeal fashion to expose the underlying dura and exiting nerve root. I undercut the lateral recess and dissected down until I was medial to and distal to the pedicle. The nerve root was well decompressed. We then  gently retracted the nerve root medially and inspected the disc space to make sure there was no disc herniation. A palpated with a coronary dilator and I felt no more compression of the nerve root. I irrigated with saline solution containing bacitracin. Achieved hemostasis with bipolar cautery, lined the dura with Gelfoam, and then closed the fascia with 0 Vicryl. I closed the subcutaneous tissues with 2-0 Vicryl and the subcuticular tissues with 3-0 Vicryl. The skin was then closed with benzoin and Steri-Strips. The drapes were removed, a sterile dressing was applied. The patient was awakened from general anesthesia and transferred to the recovery room in stable condition. At the end of the procedure all sponge, needle and instrument counts were correct.   PLAN OF CARE: Admit to inpatient   PATIENT DISPOSITION:  PACU - hemodynamically stable.   Delay start of Pharmacological VTE agent (>24hrs) due to surgical blood loss or risk of bleeding:  yes              -

## 2012-05-07 NOTE — Progress Notes (Signed)
UR COMPLETED  

## 2012-05-07 NOTE — Preoperative (Signed)
Beta Blockers   Reason not to administer Beta Blockers:Not Applicable 

## 2012-05-07 NOTE — Anesthesia Procedure Notes (Signed)
Procedure Name: Intubation Date/Time: 05/07/2012 9:49 AM Performed by: Tyrone Nine Pre-anesthesia Checklist: Patient identified, Timeout performed, Emergency Drugs available, Suction available and Patient being monitored Patient Re-evaluated:Patient Re-evaluated prior to inductionOxygen Delivery Method: Circle system utilized Preoxygenation: Pre-oxygenation with 100% oxygen Intubation Type: IV induction Ventilation: Mask ventilation without difficulty Laryngoscope Size: Mac and 3 Grade View: Grade I Tube type: Oral Tube size: 8.0 mm Number of attempts: 1 Airway Equipment and Method: Patient positioned with wedge pillow and Stylet Placement Confirmation: ETT inserted through vocal cords under direct vision,  breath sounds checked- equal and bilateral,  positive ETCO2 and CO2 detector Secured at: 22 cm Tube secured with: Tape Dental Injury: Teeth and Oropharynx as per pre-operative assessment

## 2012-05-07 NOTE — Progress Notes (Signed)
Pt. Alert and oriented,follows simple instructions, denies pain. Incision area without swelling, redness or S/S of infection. Voiding adequate clear yellow urine. Moving all extremities well and vitals stable and documented. Lumbar surgery notes instructions given to patient and family member for home safety and precautions. Pt. and family stated understanding of instructions given.  

## 2012-05-07 NOTE — Plan of Care (Signed)
Problem: Consults Goal: Diagnosis - Spinal Surgery Outcome: Completed/Met Date Met:  05/07/12 Lumbar Laminectomy (Complex)

## 2012-05-07 NOTE — Discharge Summary (Signed)
Physician Discharge Summary  Patient ID: Colleen Glenn MRN: [id number] DOB/AGE: Oct 03, 1941 71 y.o.  Admit date: 05/07/2012 Discharge date: 05/07/2012  Admission Diagnoses: lumbar stenosis    Discharge Diagnoses: same   Discharged Condition: good  Hospital Course: The patient was admitted on 05/07/2012 and taken to the operating room where the patient underwent DLL L3-4. The patient tolerated the procedure well and was taken to the recovery room and then to the floor in stable condition. The hospital course was routine. There were no complications. The wound remained clean dry and intact. Pt had appropriate back soreness. No complaints of leg pain or new N/T/W. The patient remained afebrile with stable vital signs, and tolerated a regular diet. The patient continued to increase activities, and pain was well controlled with oral pain medications.   Consults: none  Significant Diagnostic Studies:  Results for orders placed during the hospital encounter of 05/05/12  SURGICAL PCR SCREEN      Component Value Range   MRSA, PCR NEGATIVE  NEGATIVE   Staphylococcus aureus NEGATIVE  NEGATIVE  BASIC METABOLIC PANEL      Component Value Range   Sodium 139  135 - 145 mEq/L   Potassium 3.8  3.5 - 5.1 mEq/L   Chloride 102  96 - 112 mEq/L   CO2 28  19 - 32 mEq/L   Glucose, Bld 103 (*) 70 - 99 mg/dL   BUN 17  6 - 23 mg/dL   Creatinine, Ser 4.09  0.50 - 1.10 mg/dL   Calcium 9.1  8.4 - 81.1 mg/dL   GFR calc non Af Amer 84 (*) >90 mL/min   GFR calc Af Amer >90  >90 mL/min  CBC      Component Value Range   WBC 8.0  4.0 - 10.5 K/uL   RBC 4.66  3.87 - 5.11 MIL/uL   Hemoglobin 14.5  12.0 - 15.0 g/dL   HCT 91.4  78.2 - 95.6 %   MCV 93.3  78.0 - 100.0 fL   MCH 31.1  26.0 - 34.0 pg   MCHC 33.3  30.0 - 36.0 g/dL   RDW 21.3  08.6 - 57.8 %   Platelets 266  150 - 400 K/uL  DIFFERENTIAL      Component Value Range   Neutrophils Relative 47  43 - 77 %   Neutro Abs 3.7  1.7 - 7.7 K/uL   Lymphocytes  Relative 42  12 - 46 %   Lymphs Abs 3.4  0.7 - 4.0 K/uL   Monocytes Relative 9  3 - 12 %   Monocytes Absolute 0.7  0.1 - 1.0 K/uL   Eosinophils Relative 2  0 - 5 %   Eosinophils Absolute 0.2  0.0 - 0.7 K/uL   Basophils Relative 0  0 - 1 %   Basophils Absolute 0.0  0.0 - 0.1 K/uL  PROTIME-INR      Component Value Range   Prothrombin Time 13.3  11.6 - 15.2 seconds   INR 0.99  0.00 - 1.49    Chest 2 View  05/05/2012  *RADIOLOGY REPORT*  Clinical Data: Preoperative respiratory exam.  Lumbar disc protrusion.  CHEST - 2 VIEW  Comparison: Chest x-ray dated 12/05/2009  Findings: Heart size and pulmonary vascularity are normal and the lungs are clear.  No significant osseous abnormality.  Surgical clips at the gastroesophageal junction.  IMPRESSION: No acute abnormality.  Original Report Authenticated By: Gwynn Burly, M.D.   Dg Lumbar Spine 2-3 Views  05/07/2012  *  RADIOLOGY REPORT*  Clinical Data:  L3-L4 microdiskectomy  LUMBAR SPINE - 2-3 VIEW  Comparison: MRI lumbar spine 04/25/2012  Findings: Prior MRI labeled with five independent lumbar-type vertebrae. Cross-table lateral intraoperative views of the lumbar spine are labeled accordingly. Examination interpreted at 1143 hours.  Image #1 at 1025 hours. Metallic probe via posterior approach is at the L2-L3 disc space level.  Image #2 at 1030 hours. Due to posterior overpenetration on image, unable to visualize a metal probe.  Image #3 at 1040 hours Metallic probe via posterior approach is at the L3-L4 disc space level.  IMPRESSION: Intraoperative lumbar spine localization films as above.  Original Report Authenticated By: Lollie Marrow, M.D.   Mr Lumbar Spine Wo Contrast  04/25/2012  *RADIOLOGY REPORT*  Clinical Data: Low back pain.  Right thigh pain.  Right leg weakness.  Distant history of surgery.  Symptoms worsening.  MRI LUMBAR SPINE WITHOUT CONTRAST  Technique:  Multiplanar and multiecho pulse sequences of the lumbar spine were obtained  without intravenous contrast.  Comparison: 05/02/2011  Findings: T10-11 and T11-12:  Insignificant disc bulges.  T12-L1:  Central to slightly right-sided disc herniation with upward migration of disc material behind T12.  This indents the ventral aspect of the thecal sac but does not appear to affect the neural structures.  L1-2:  Shallow disc protrusion with slight caudal down turning in the midline.  Slight indentation of the thecal sac but no apparent neural compression.  L2-3:  Disc degeneration with worsened discogenic edema within the L2 and L3 vertebral bodies.  Shallow protrusion of the disc more towards the left.  Mild facet arthropathy and hypertrophy. Narrowing of the left lateral recess that could possibly cause neural compression.  L3-4:  Disc degeneration with shallow broad-based herniation. Facet and ligamentous hypertrophy.  Stenosis of both lateral recesses.  Neural compression could occur on either side.  L4-5:  Minimal bulging of the disc.  No stenosis.  L5-S1:  Previous right hemilaminectomy.  The disc shows chronic loss of height.  There is retrolisthesis of 3 mm.  There are endplate osteophytes there is mild bulging of the disc.  The central canal is widely patent.  There is foraminal narrowing more on the right than the left.  Since the previous study, the only discernible changes worsening of the discogenic edema within the L2 and the L3 vertebral bodies and perhaps very slight enlargement of the T12-L1 disc herniation.  IMPRESSION: T12-L1:  Central right-sided disc herniation with upward migration of disc material.  No apparent neural compression.  This may be very minimally larger than seen previously.  L1-2:  Shallow herniation with slight caudal down turning.  No apparent neural compression.  L2-3:  Worsening of discogenic edema in the vertebral bodies.  This could be associated with worsening back pain.  Shallow protrusion of disc material more towards the left with narrowing of the left  lateral recess.  L3-4:  Broad-based herniation of disc material.  Facet and ligamentous hypertrophy.  Stenosis of both lateral recesses that could possibly cause neural compression.  L4-5:  Minimal non compressive degenerative disease.  L5-S1:  Previous right sided decompression.  Chronic disc degeneration.  No central canal stenosis.  Mild chronic right foraminal narrowing.  Original Report Authenticated By: Thomasenia Sales, M.D.    Antibiotics:  Anti-infectives     Start     Dose/Rate Route Frequency Ordered Stop   05/07/12 1730   ceFAZolin (ANCEF) IVPB 1 g/50 mL premix  1 g 100 mL/hr over 30 Minutes Intravenous Every 8 hours 05/07/12 1243 05/08/12 0929   05/07/12 1008   bacitracin 50,000 Units in sodium chloride irrigation 0.9 % 500 mL irrigation  Status:  Discontinued          As needed 05/07/12 1008 05/07/12 1129   05/07/12 0927   bacitracin [address] UNITS injection     Comments: DAY, DORY: cabinet override         05/07/12 0927 05/07/12 2129   05/07/12 0000   ceFAZolin (ANCEF) IVPB 2 g/50 mL premix        2 g 100 mL/hr over 30 Minutes Intravenous 60 min pre-op 05/06/12 1454 05/07/12 0930          Discharge Exam: Blood pressure 168/73, pulse 92, temperature 97.8 F (36.6 C), temperature source Oral, resp. rate 20, SpO2 94.00%. Normal exam Incision CDI  Discharge Medications:   Medication List  As of 05/07/2012  5:40 PM   ASK your doctor about these medications         aspirin EC 81 MG tablet   Take 81 mg by mouth daily.      buPROPion 300 MG 24 hr tablet   Commonly known as: WELLBUTRIN XL   Take 300 mg by mouth daily.      omeprazole 20 MG capsule   Commonly known as: PRILOSEC   Take 20 mg by mouth 2 (two) times daily.      polycarbophil 625 MG tablet   Commonly known as: FIBERCON   Take 625 mg by mouth daily.      raloxifene 60 MG tablet   Commonly known as: EVISTA   Take 60 mg by mouth daily.      simvastatin 40 MG tablet   Commonly known as: ZOCOR    Take 20 mg by mouth at bedtime.      triamterene-hydrochlorothiazide 37.5-25 MG per tablet   Commonly known as: MAXZIDE-25   Take 1 tablet by mouth daily.      Vitamin D (Ergocalciferol) 50000 UNITS Caps   Commonly known as: DRISDOL   Take 50,000 Units by mouth every 7 (seven) days. Mondays            Disposition: home   Final Dx: DLL L3-4       Signed: JONES,DAVID S 05/07/2012, 5:40 PM

## 2012-05-07 NOTE — Transfer of Care (Signed)
Immediate Anesthesia Transfer of Care Note  Patient: Colleen Glenn  Procedure(s) Performed: Procedure(s) (LRB): LUMBAR LAMINECTOMY/DECOMPRESSION MICRODISCECTOMY 1 LEVEL (Right)  Patient Location: PACU  Anesthesia Type: General  Level of Consciousness: awake, alert , oriented and patient cooperative  Airway & Oxygen Therapy: Patient Spontanous Breathing and Patient connected to nasal cannula oxygen  Post-op Assessment: Report given to PACU RN and Post -op Vital signs reviewed and stable  Post vital signs: Reviewed and stable  Complications: No apparent anesthesia complications

## 2012-05-07 NOTE — Anesthesia Postprocedure Evaluation (Signed)
Anesthesia Post Note  Patient: Colleen Glenn  Procedure(s) Performed: Procedure(s) (LRB): LUMBAR LAMINECTOMY/DECOMPRESSION MICRODISCECTOMY 1 LEVEL (Right)  Anesthesia type: general  Patient location: PACU  Post pain: Pain level controlled  Post assessment: Patient's Cardiovascular Status Stable  Last Vitals:  Filed Vitals:   05/07/12 1209  BP:   Pulse:   Temp: 36.2 C  Resp:     Post vital signs: Reviewed and stable  Level of consciousness: sedated  Complications: No apparent anesthesia complications

## 2012-05-07 NOTE — H&P (Signed)
Subjective: Patient is a 71 y.o. female admitted for decompressive laminectomy. Onset of symptoms was several months ago, gradually worsening since that time.  The pain is rated severe, and is located at the across the lower back and radiates to legs. The pain is described as aching and occurs intermittently. The symptoms have been progressive. Symptoms are exacerbated by exercise. MRI or CT showed stenosis L3-4   Past Medical History  Diagnosis Date  . Depression   . Hypertension   . Hyperlipidemia   . GERD (gastroesophageal reflux disease)   . Arthritis   . Plantar fasciitis     hx of    Past Surgical History  Procedure Date  . Rotator cuff repair     right side  . Abdominal hysterectomy   . Tonsillectomy   . Appendectomy   . Back surgery   . Foot surgery     bilateral   . Vagotomy     approx. 35 years ago, states has a clamp mid epigastric area  . Tubal ligation   . Hemorrhoid surgery     Prior to Admission medications   Medication Sig Start Date End Date Taking? Authorizing Provider  aspirin EC 81 MG tablet Take 81 mg by mouth daily.   Yes Historical Provider, MD  buPROPion (WELLBUTRIN XL) 300 MG 24 hr tablet Take 300 mg by mouth daily.   Yes Historical Provider, MD  omeprazole (PRILOSEC) 20 MG capsule Take 20 mg by mouth 2 (two) times daily.   Yes Historical Provider, MD  polycarbophil (FIBERCON) 625 MG tablet Take 625 mg by mouth daily.   Yes Historical Provider, MD  raloxifene (EVISTA) 60 MG tablet Take 60 mg by mouth daily.   Yes Historical Provider, MD  simvastatin (ZOCOR) 40 MG tablet Take 20 mg by mouth at bedtime.   Yes Historical Provider, MD  triamterene-hydrochlorothiazide (MAXZIDE-25) 37.5-25 MG per tablet Take 1 tablet by mouth daily.   Yes Historical Provider, MD  Vitamin D, Ergocalciferol, (DRISDOL) 50000 UNITS CAPS Take 50,000 Units by mouth every 7 (seven) days. Mondays   Yes Historical Provider, MD   No Known Allergies  History  Substance Use Topics    . Smoking status: Former Games developer  . Smokeless tobacco: Not on file   Comment: 45 years ago  . Alcohol Use: Yes     occassional    History reviewed. No pertinent family history.   Review of Systems  Positive ROS: Negative  All other systems have been reviewed and were otherwise negative with the exception of those mentioned in the HPI and as above.  Objective: Vital signs in last 24 hours: Temp:  [98.3 F (36.8 C)] 98.3 F (36.8 C) (07/18 0844) Pulse Rate:  [96] 96  (07/18 0844) Resp:  [18] 18  (07/18 0844) BP: (147)/(83) 147/83 mmHg (07/18 0844) SpO2:  [98 %] 98 % (07/18 0844)  General Appearance: Alert, cooperative, no distress, appears stated age Head: Normocephalic, without obvious abnormality, atraumatic Eyes: PERRL, conjunctiva/corneas clear, EOM's intact, fundi benign, both eyes      Ears: Normal TM's and external ear canals, both ears Throat: Lips, mucosa, and tongue normal; teeth and gums normal Neck: Supple, symmetrical, trachea midline, no adenopathy; thyroid: No enlargement/tenderness/nodules; no carotid bruit or JVD Back: Symmetric, no curvature, ROM normal, no CVA tenderness Lungs: Clear to auscultation bilaterally, respirations unlabored Heart: Regular rate and rhythm, S1 and S2 normal, no murmur, rub or gallop Abdomen: Soft, non-tender, bowel sounds active all four quadrants, no masses, no organomegaly  Extremities: Extremities normal, atraumatic, no cyanosis or edema Pulses: 2+ and symmetric all extremities Skin: Skin color, texture, turgor normal, no rashes or lesions  NEUROLOGIC:   Mental status: Alert and oriented x4,  no aphasia, good attention span, fund of knowledge, and memory Motor Exam - grossly normal Sensory Exam - grossly normal Reflexes: 1+ Coordination - grossly normal Gait - grossly normal Balance - grossly normal Cranial Nerves: I: smell Not tested  II: visual acuity  OS: nl    OD: nl  II: visual fields Full to confrontation  II:  pupils Equal, round, reactive to light  III,VII: ptosis None  III,IV,VI: extraocular muscles  Full ROM  V: mastication Normal  V: facial light touch sensation  Normal  V,VII: corneal reflex  Present  VII: facial muscle function - upper  Normal  VII: facial muscle function - lower Normal  VIII: hearing Not tested  IX: soft palate elevation  Normal  IX,X: gag reflex Present  XI: trapezius strength  5/5  XI: sternocleidomastoid strength 5/5  XI: neck flexion strength  5/5  XII: tongue strength  Normal    Data Review Lab Results  Component Value Date   WBC 8.0 05/05/2012   HGB 14.5 05/05/2012   HCT 43.5 05/05/2012   MCV 93.3 05/05/2012   PLT 266 05/05/2012   Lab Results  Component Value Date   NA 139 05/05/2012   K 3.8 05/05/2012   CL 102 05/05/2012   CO2 28 05/05/2012   BUN 17 05/05/2012   CREATININE 0.74 05/05/2012   GLUCOSE 103* 05/05/2012   Lab Results  Component Value Date   INR 0.99 05/05/2012    Assessment/Plan: Patient admitted for decompressive laminectomy L3-4. Patient has failed conservative therapy.  I explained the condition and procedure to the patient and answered any questions.  Patient wishes to proceed with procedure as planned. Understands risks/ benefits and typical outcomes of procedure.   Colleen Glenn S 05/07/2012 9:02 AM

## 2012-05-07 NOTE — Anesthesia Preprocedure Evaluation (Addendum)
Anesthesia Evaluation  Patient identified by MRN, date of birth, ID band Patient awake    Reviewed: Allergy & Precautions, H&P , NPO status , Patient's Chart, lab work & pertinent test results  Airway Mallampati: I TM Distance: >3 FB Neck ROM: Full    Dental  (+) Teeth Intact and Dental Advisory Given,    Pulmonary neg pulmonary ROS,    Pulmonary exam normal       Cardiovascular hypertension, Pt. on medications Rhythm:Regular     Neuro/Psych PSYCHIATRIC DISORDERS Anxiety Depression negative neurological ROS     GI/Hepatic Neg liver ROS, GERD-  Medicated and Controlled,  Endo/Other  negative endocrine ROS  Renal/GU negative Renal ROS     Musculoskeletal  (+) Arthritis -, Osteoarthritis,    Abdominal Normal abdominal exam  (+)   Peds  Hematology negative hematology ROS (+)   Anesthesia Other Findings   Reproductive/Obstetrics negative OB ROS                         Anesthesia Physical Anesthesia Plan  ASA: II  Anesthesia Plan: General   Post-op Pain Management:    Induction: Intravenous  Airway Management Planned: Oral ETT  Additional Equipment:   Intra-op Plan:   Post-operative Plan: Extubation in OR  Informed Consent: I have reviewed the patients History and Physical, chart, labs and discussed the procedure including the risks, benefits and alternatives for the proposed anesthesia with the patient or authorized representative who has indicated his/her understanding and acceptance.   Dental advisory given  Plan Discussed with: CRNA and Surgeon  Anesthesia Plan Comments:        Anesthesia Quick Evaluation

## 2012-05-08 ENCOUNTER — Encounter (HOSPITAL_COMMUNITY): Payer: Self-pay | Admitting: Neurological Surgery

## 2012-06-09 DIAGNOSIS — M25559 Pain in unspecified hip: Secondary | ICD-10-CM | POA: Diagnosis not present

## 2012-07-07 DIAGNOSIS — Z23 Encounter for immunization: Secondary | ICD-10-CM | POA: Diagnosis not present

## 2012-08-04 DIAGNOSIS — M169 Osteoarthritis of hip, unspecified: Secondary | ICD-10-CM | POA: Diagnosis not present

## 2012-08-19 DIAGNOSIS — I10 Essential (primary) hypertension: Secondary | ICD-10-CM | POA: Diagnosis not present

## 2012-08-19 DIAGNOSIS — E663 Overweight: Secondary | ICD-10-CM | POA: Diagnosis not present

## 2012-08-19 DIAGNOSIS — E785 Hyperlipidemia, unspecified: Secondary | ICD-10-CM | POA: Diagnosis not present

## 2012-09-08 ENCOUNTER — Other Ambulatory Visit: Payer: Self-pay | Admitting: Orthopedic Surgery

## 2012-09-08 ENCOUNTER — Encounter (HOSPITAL_COMMUNITY): Payer: Self-pay | Admitting: Pharmacy Technician

## 2012-09-08 MED ORDER — DEXAMETHASONE SODIUM PHOSPHATE 10 MG/ML IJ SOLN: 10.0000 mg | Freq: Once | INTRAMUSCULAR | Status: DC

## 2012-09-08 MED ORDER — BUPIVACAINE LIPOSOME 1.3 % IJ SUSP: 20.0000 mL | Freq: Once | INTRAMUSCULAR | Status: DC

## 2012-09-08 NOTE — Progress Notes (Signed)
Preoperative surgical orders have been place into the Epic hospital system for Colleen Glenn on 09/08/2012, 11:11 AM  by Patrica Duel for surgery on 09/21/2012.  Preop Total Hip orders including Experel Injecion, IV Tylenol, and IV Decadron as long as there are no contraindications to the above medications. Avel Peace, PA-C

## 2012-09-10 ENCOUNTER — Inpatient Hospital Stay (HOSPITAL_COMMUNITY): Admission: RE | Admit: 2012-09-10 | Payer: Medicare Other | Source: Ambulatory Visit

## 2012-09-29 DIAGNOSIS — M169 Osteoarthritis of hip, unspecified: Secondary | ICD-10-CM | POA: Diagnosis not present

## 2012-10-21 HISTORY — PX: JOINT REPLACEMENT: SHX530

## 2012-11-03 ENCOUNTER — Encounter (HOSPITAL_COMMUNITY): Payer: Self-pay | Admitting: Pharmacy Technician

## 2012-11-05 NOTE — Patient Instructions (Signed)
Colleen Glenn  11/05/2012   Your procedure is scheduled on:  11/16/12   Report to [facility] at 1245pm  Call this number if you have problems the morning of surgery: [phone]   Remember:   Do not eat food after midnite.               May have clear liquids until 0830am then npo.     Take these medicines the morning of surgery with A SIP OF WATER:    Do not wear jewelry, make-up or nail polish.  Do not wear lotions, powders, or perfumes.   Do not shave 48 hours prior to surgery.   Do not bring valuables to the hospital.  Contacts, dentures or bridgework may not be worn into surgery.  Leave suitcase in the car. After surgery it may be brought to your room.  For patients admitted to the hospital, checkout time is 11:00 AM the day of  discharge.   SEE CHG INSTRUCTION SHEET    Please read over the following fact sheets that you were given: MRSA Information, Blood Transfusion Fact sheet, Incentive Spirometry Fact sheet , coughing and deep breathing exercises, leg exercises.               Failure to comply with these instructions may result in cancellation of your surgery.                Patient Signature ______________________________              Nurse Signature _______________________________

## 2012-11-06 ENCOUNTER — Encounter (HOSPITAL_COMMUNITY): Payer: Self-pay

## 2012-11-06 ENCOUNTER — Encounter (HOSPITAL_COMMUNITY)
Admission: RE | Admit: 2012-11-06 | Discharge: 2012-11-06 | Disposition: A | Discharge status: Home / Self Care | Payer: Medicare Other | Source: Ambulatory Visit | Attending: Orthopedic Surgery | Admitting: Orthopedic Surgery

## 2012-11-06 ENCOUNTER — Ambulatory Visit (HOSPITAL_COMMUNITY)
Admission: RE | Admit: 2012-11-06 | Discharge: 2012-11-06 | Disposition: A | Discharge status: Home / Self Care | Payer: Medicare Other | Source: Ambulatory Visit | Attending: Orthopedic Surgery | Admitting: Orthopedic Surgery

## 2012-11-06 DIAGNOSIS — Z01812 Encounter for preprocedural laboratory examination: Secondary | ICD-10-CM | POA: Diagnosis not present

## 2012-11-06 DIAGNOSIS — M169 Osteoarthritis of hip, unspecified: Secondary | ICD-10-CM | POA: Diagnosis not present

## 2012-11-06 DIAGNOSIS — Z01818 Encounter for other preprocedural examination: Secondary | ICD-10-CM | POA: Diagnosis not present

## 2012-11-06 DIAGNOSIS — M1610 Unilateral primary osteoarthritis, unspecified hip: Secondary | ICD-10-CM | POA: Insufficient documentation

## 2012-11-06 LAB — URINE MICROSCOPIC-ADD ON

## 2012-11-06 LAB — COMPREHENSIVE METABOLIC PANEL
ALT: 12 U/L (ref 0–35)
Alkaline Phosphatase: 88 U/L (ref 39–117)
BUN: 14 mg/dL (ref 6–23)
CO2: 27 mEq/L (ref 19–32)
Chloride: 104 mEq/L (ref 96–112)
GFR calc Af Amer: >90 mL/min (ref >90)
GFR calc non Af Amer: 82 mL/min — ABNORMAL LOW (ref >90)
Glucose, Bld: 96 mg/dL (ref 70–99)
Potassium: 3.9 mEq/L (ref 3.5–5.1)
Sodium: 141 mEq/L (ref 135–145)
Total Bilirubin: 0.2 mg/dL — ABNORMAL LOW (ref 0.3–1.2)

## 2012-11-06 LAB — SURGICAL PCR SCREEN: MRSA, PCR: NEGATIVE

## 2012-11-06 LAB — URINALYSIS, ROUTINE W REFLEX MICROSCOPIC
Bilirubin Urine: NEGATIVE
Glucose, UA: NEGATIVE mg/dL
Hgb urine dipstick: NEGATIVE
Protein, ur: NEGATIVE mg/dL
Urobilinogen, UA: 0.2 mg/dL (ref 0.0–1.0)

## 2012-11-06 LAB — CBC
HCT: 43.2 % (ref 36.0–46.0)
Hemoglobin: 14.2 g/dL (ref 12.0–15.0)
MCHC: 32.9 g/dL (ref 30.0–36.0)
RBC: 4.56 MIL/uL (ref 3.87–5.11)
WBC: 6.7 10*3/uL (ref 4.0–10.5)

## 2012-11-06 LAB — APTT: aPTT: 28 seconds (ref 24–37)

## 2012-11-06 LAB — PROTIME-INR: Prothrombin Time: 12.6 seconds (ref 11.6–15.2)

## 2012-11-06 NOTE — Progress Notes (Deleted)
Telford Nab, RN stated patient should be on a clear liquid diet day before surgeryper order of Dr Duard Brady.  I told Telford Nab, RN that I would call patient at home and made sure she understood clear liquids.    Patient was called at home and instructed on clear liquid diet when she wakes up on 11/09/12 ( Monday ) Instructed patient to use the clear liquid diet sheet she had been given on preop appointment.  Patient voiced understanding. Patient also made aware that nasal swab was negative.  Patient voiced understanding.  Again , apologized for delay in lab draw this am at preop appointment.

## 2012-11-06 NOTE — Progress Notes (Signed)
Urinalysis with micro results faxed via EPIC to Dr Aluisio.   

## 2012-11-15 ENCOUNTER — Other Ambulatory Visit: Payer: Self-pay | Admitting: Orthopedic Surgery

## 2012-11-15 NOTE — H&P (Signed)
Colleen Glenn  DOB: 02/28/1941 Married / Language: English / Race: White Female  Date of Admission:  11/16/2012  Chief Complaint:  Right Hip Pain  History of Present Illness The patient is a 71 year old female who comes in for a preoperative History and Physical. The patient is scheduled for a right total hip arthroplasty to be performed by Dr. Frank V. Aluisio, MD at [facility] on 11/16/2012. The patient is a 71 year old female who presents for follow up of their hip. The patient is being followed for their right hip pain and osteoarthritis. They are 2 month(s) out from last visit. Symptoms reported today include: pain. The patient indicates that they have questions or concerns today regarding surgery (She is scheduled for surgery January 29th.). She still has considerable pain in that hip. It is bothering her at most times. She does not have any pain in the left hip. The right hip does hurt at night but not every single night. It is limiting what she can and can not do. She decided not to have the intra-articular injection and opted instead for hip replacement. She is ready to proceed with surgery. They have been treated conservatively in the past for the above stated problem and despite conservative measures, they continue to have progressive pain and severe functional limitations and dysfunction. They have failed non-operative management including home exercise, medications. It is felt that they would benefit from undergoing total joint replacement. Risks and benefits of the procedure have been discussed with the patient and they elect to proceed with surgery. There are no active contraindications to surgery such as ongoing infection or rapidly progressive neurological disease.   Problem List Osteoarthritis, Hip (715.35)   Allergies No Known Drug Allergies. 08/04/2012   Family History Hypertension. mother Osteoporosis. mother Heart Disease. mother Cancer.  father Congestive Heart Failure. mother   Social History Current work status. retired Drug/Alcohol Rehab (Currently). no Drug/Alcohol Rehab (Previously). no Children. 3 Alcohol use. current drinker; drinks wine; only occasionally per week Exercise. Exercises weekly; does running / walking Pain Contract. no Number of flights of stairs before winded. greater than 5 Illicit drug use. no Living situation. live with spouse Marital status. married Post-Surgical Plans. Plan is to go home.   Medication History BuPROPion HCl ER (XL) ( Oral) Specific dose unknown - Active. Simvastatin (40MG Tablet, Oral) Active. Aspirin EC (81MG Tablet DR, Oral) Active. Vitamin D ( Oral) Specific dose unknown - Active. Fiber Complete ( Oral) Specific dose unknown - Active. Stool Softener ( Oral) Specific dose unknown - Active. Omeprazole (10MG Capsule DR, Oral) Active.   Past Surgical History Resection of Stomach Spinal Surgery Tonsillectomy Other Orthopaedic Surgery Colon Polyp Removal - Colonoscopy Hemorrhoidectomy Hysterectomy. partial (non-cancerous) Tubal Ligation   Medical History High blood pressure Hypercholesterolemia Depression Shingles Gastroesophageal Reflux Disease Hemorrhoids Diverticulosis   Vitals Weight: 175 lb Height: 66 in Body Surface Area: 1.92 m Body Mass Index: 28.25 kg/m Pulse: 108 (Regular) Resp.: 20 (Unlabored) BP: 144/82 (Sitting, Left Arm, Standard)    Physical Exam The physical exam findings are as follows:   General Mental Status - Alert, cooperative and good historian. General Appearance- pleasant. Not in acute distress. Orientation- Oriented X3. Build & Nutrition- Well nourished and Well developed.   Head and Neck Head- normocephalic, atraumatic . Neck Global Assessment- supple. no bruit auscultated on the right and no bruit auscultated on the left.   Eye Vision- Wears corrective lenses. Pupil-  Bilateral- Regular and Round. Motion- Bilateral-   EOMI.   Chest and Lung Exam Auscultation: Breath sounds:- clear at anterior chest wall and - clear at posterior chest wall. Adventitious sounds:- No Adventitious sounds.   Cardiovascular Auscultation:Rhythm- Regular rate and rhythm. Heart Sounds- S1 WNL and S2 WNL. Murmurs & Other Heart Sounds:Auscultation of the heart reveals - No Murmurs.   Abdomen Palpation/Percussion:Tenderness- Abdomen is non-tender to palpation. Rigidity (guarding)- Abdomen is soft. Auscultation:Auscultation of the abdomen reveals - Bowel sounds normal.   Female Genitourinary Not done, not pertinent to present illness  Musculoskeletal On exam well developed female alert and oriented in no apparent distress. Her left hip has normal range of motion with no discomfort. The right hip has flexion to 95. Minimal internal rotation about 10, 20 external rotation, 20 abduction with pain on range of motion.  RADIOGRAPHS: X-rays -She has bone on bone arthritis in the right hip.  Assessment & Plan Osteoarthritis, Hip (715.35) Impression: Right Hip  Note: Plan is for a Right Total Hip Replacement by Dr. Aluisio.  Plan is to go home following the hospital stay.  PCP - Dr. Mark Perini  Signed electronically by DREW L PERKINS, PA-C  

## 2012-11-16 ENCOUNTER — Encounter (HOSPITAL_COMMUNITY): Payer: Self-pay | Admitting: *Deleted

## 2012-11-16 ENCOUNTER — Inpatient Hospital Stay (HOSPITAL_COMMUNITY): Payer: Medicare Other

## 2012-11-16 ENCOUNTER — Encounter (HOSPITAL_COMMUNITY)
Admission: RE | Disposition: A | Discharge status: Home Health | Payer: Self-pay | Source: Ambulatory Visit | Attending: Orthopedic Surgery

## 2012-11-16 ENCOUNTER — Inpatient Hospital Stay (HOSPITAL_COMMUNITY)
Admission: RE | Admit: 2012-11-16 | Discharge: 2012-11-19 | DRG: 470 | Disposition: A | Discharge status: Home Health | Payer: Medicare Other | Source: Ambulatory Visit | Attending: Orthopedic Surgery | Admitting: Orthopedic Surgery

## 2012-11-16 ENCOUNTER — Encounter (HOSPITAL_COMMUNITY): Payer: Self-pay | Admitting: Certified Registered Nurse Anesthetist

## 2012-11-16 ENCOUNTER — Inpatient Hospital Stay (HOSPITAL_COMMUNITY): Payer: Medicare Other | Admitting: Certified Registered Nurse Anesthetist

## 2012-11-16 DIAGNOSIS — K219 Gastro-esophageal reflux disease without esophagitis: Secondary | ICD-10-CM | POA: Diagnosis present

## 2012-11-16 DIAGNOSIS — F3289 Other specified depressive episodes: Secondary | ICD-10-CM | POA: Diagnosis present

## 2012-11-16 DIAGNOSIS — I10 Essential (primary) hypertension: Secondary | ICD-10-CM | POA: Diagnosis present

## 2012-11-16 DIAGNOSIS — M169 Osteoarthritis of hip, unspecified: Principal | ICD-10-CM | POA: Diagnosis present

## 2012-11-16 DIAGNOSIS — Z96649 Presence of unspecified artificial hip joint: Secondary | ICD-10-CM

## 2012-11-16 DIAGNOSIS — F411 Generalized anxiety disorder: Secondary | ICD-10-CM | POA: Diagnosis present

## 2012-11-16 DIAGNOSIS — D62 Acute posthemorrhagic anemia: Secondary | ICD-10-CM | POA: Diagnosis not present

## 2012-11-16 DIAGNOSIS — M1610 Unilateral primary osteoarthritis, unspecified hip: Principal | ICD-10-CM | POA: Diagnosis present

## 2012-11-16 DIAGNOSIS — E871 Hypo-osmolality and hyponatremia: Secondary | ICD-10-CM | POA: Diagnosis not present

## 2012-11-16 DIAGNOSIS — M25559 Pain in unspecified hip: Secondary | ICD-10-CM | POA: Diagnosis not present

## 2012-11-16 DIAGNOSIS — F329 Major depressive disorder, single episode, unspecified: Secondary | ICD-10-CM | POA: Diagnosis present

## 2012-11-16 DIAGNOSIS — Z471 Aftercare following joint replacement surgery: Secondary | ICD-10-CM | POA: Diagnosis not present

## 2012-11-16 HISTORY — PX: TOTAL HIP ARTHROPLASTY: SHX124

## 2012-11-16 LAB — TYPE AND SCREEN: ABO/RH(D): O NEG

## 2012-11-16 LAB — ABO/RH: ABO/RH(D): O NEG

## 2012-11-16 SURGERY — ARTHROPLASTY, HIP, TOTAL,POSTERIOR APPROACH
Anesthesia: Spinal | Site: Hip | Laterality: Right | Wound class: Clean

## 2012-11-16 MED ORDER — FLEET ENEMA 7-19 GM/118ML RE ENEM: 1.0000 | ENEMA | Freq: Once | RECTAL | Status: AC | PRN

## 2012-11-16 MED ORDER — CEFAZOLIN SODIUM-DEXTROSE 2-3 GM-% IV SOLR
2.0000 g | Freq: Four times a day (QID) | INTRAVENOUS | Status: AC
  Administered 2012-11-16 – 2012-11-17 (×2): 2 g via INTRAVENOUS
  Filled 2012-11-16 (×2): qty 50

## 2012-11-16 MED ORDER — MENTHOL 3 MG MT LOZG: 1.0000 | LOZENGE | OROMUCOSAL | Status: DC | PRN

## 2012-11-16 MED ORDER — BUPROPION HCL ER (XL) 300 MG PO TB24
300.0000 mg | ORAL_TABLET | Freq: Every day | ORAL | Status: DC
  Administered 2012-11-17 – 2012-11-19 (×3): 300 mg via ORAL
  Filled 2012-11-16 (×4): qty 1

## 2012-11-16 MED ORDER — DIPHENHYDRAMINE HCL 12.5 MG/5ML PO ELIX: 12.5000 mg | ORAL_SOLUTION | ORAL | Status: DC | PRN

## 2012-11-16 MED ORDER — ACETAMINOPHEN 10 MG/ML IV SOLN
1000.0000 mg | Freq: Four times a day (QID) | INTRAVENOUS | Status: AC
  Administered 2012-11-16 – 2012-11-17 (×3): 1000 mg via INTRAVENOUS
  Filled 2012-11-16 (×7): qty 100

## 2012-11-16 MED ORDER — ALUM & MAG HYDROXIDE-SIMETH 200-200-20 MG/5ML PO SUSP
30.0000 mL | ORAL | Status: DC | PRN
  Administered 2012-11-16: 30 mL via ORAL
  Filled 2012-11-16: qty 30

## 2012-11-16 MED ORDER — SIMVASTATIN 20 MG PO TABS
20.0000 mg | ORAL_TABLET | Freq: Every day | ORAL | Status: DC
Start: 2012-11-16 — End: 2012-11-19
  Administered 2012-11-16 – 2012-11-18 (×3): 20 mg via ORAL
  Filled 2012-11-16 (×4): qty 1

## 2012-11-16 MED ORDER — DEXAMETHASONE SODIUM PHOSPHATE 10 MG/ML IJ SOLN: 10.0000 mg | Freq: Once | INTRAMUSCULAR | Status: AC

## 2012-11-16 MED ORDER — PROMETHAZINE HCL 25 MG/ML IJ SOLN: 6.2500 mg | INTRAMUSCULAR | Status: DC | PRN

## 2012-11-16 MED ORDER — TRIAMTERENE-HCTZ 37.5-25 MG PO TABS
1.0000 | ORAL_TABLET | Freq: Every day | ORAL | Status: DC
Start: 2012-11-17 — End: 2012-11-19
  Administered 2012-11-17 – 2012-11-19 (×3): 1 via ORAL
  Filled 2012-11-16 (×4): qty 1

## 2012-11-16 MED ORDER — METHOCARBAMOL 500 MG PO TABS
500.0000 mg | ORAL_TABLET | Freq: Four times a day (QID) | ORAL | Status: DC | PRN
  Administered 2012-11-16: 500 mg via ORAL
  Filled 2012-11-16: qty 1

## 2012-11-16 MED ORDER — MEPERIDINE HCL 50 MG/ML IJ SOLN: 6.2500 mg | INTRAMUSCULAR | Status: DC | PRN

## 2012-11-16 MED ORDER — TRAMADOL HCL 50 MG PO TABS
50.0000 mg | ORAL_TABLET | Freq: Four times a day (QID) | ORAL | Status: DC | PRN
  Filled 2012-11-16: qty 1

## 2012-11-16 MED ORDER — ONDANSETRON HCL 4 MG PO TABS
4.0000 mg | ORAL_TABLET | Freq: Four times a day (QID) | ORAL | Status: DC | PRN
  Administered 2012-11-18: 4 mg via ORAL
  Filled 2012-11-16: qty 1

## 2012-11-16 MED ORDER — PHENYLEPHRINE HCL 10 MG/ML IJ SOLN
INTRAMUSCULAR | Status: DC | PRN
  Administered 2012-11-16 (×2): 80 ug via INTRAVENOUS

## 2012-11-16 MED ORDER — PHENOL 1.4 % MT LIQD: 1.0000 | OROMUCOSAL | Status: DC | PRN

## 2012-11-16 MED ORDER — BISACODYL 10 MG RE SUPP: 10.0000 mg | Freq: Every day | RECTAL | Status: DC | PRN

## 2012-11-16 MED ORDER — HYDROMORPHONE HCL PF 1 MG/ML IJ SOLN: 0.2500 mg | INTRAMUSCULAR | Status: DC | PRN

## 2012-11-16 MED ORDER — LACTATED RINGERS IV SOLN
INTRAVENOUS | Status: DC | PRN
  Administered 2012-11-16 (×2): via INTRAVENOUS

## 2012-11-16 MED ORDER — DEXAMETHASONE 6 MG PO TABS
10.0000 mg | ORAL_TABLET | Freq: Once | ORAL | Status: AC
  Administered 2012-11-17: 10 mg via ORAL
  Filled 2012-11-16: qty 1

## 2012-11-16 MED ORDER — PANTOPRAZOLE SODIUM 40 MG PO TBEC
40.0000 mg | DELAYED_RELEASE_TABLET | Freq: Two times a day (BID) | ORAL | Status: DC
  Administered 2012-11-16: 40 mg via ORAL
  Filled 2012-11-16 (×3): qty 1

## 2012-11-16 MED ORDER — CHLORHEXIDINE GLUCONATE 4 % EX LIQD
60.0000 mL | Freq: Once | CUTANEOUS | Status: DC
  Filled 2012-11-16: qty 60

## 2012-11-16 MED ORDER — MORPHINE SULFATE 2 MG/ML IJ SOLN
1.0000 mg | INTRAMUSCULAR | Status: DC | PRN
  Administered 2012-11-16 – 2012-11-17 (×5): 2 mg via INTRAVENOUS
  Filled 2012-11-16 (×5): qty 1

## 2012-11-16 MED ORDER — ONDANSETRON HCL 4 MG/2ML IJ SOLN
4.0000 mg | Freq: Four times a day (QID) | INTRAMUSCULAR | Status: DC | PRN
  Administered 2012-11-17 (×2): 4 mg via INTRAVENOUS
  Filled 2012-11-16 (×2): qty 2

## 2012-11-16 MED ORDER — PANTOPRAZOLE SODIUM 40 MG PO TBEC
40.0000 mg | DELAYED_RELEASE_TABLET | Freq: Every day | ORAL | Status: DC
  Filled 2012-11-16: qty 1

## 2012-11-16 MED ORDER — OXYCODONE HCL 5 MG PO TABS
5.0000 mg | ORAL_TABLET | ORAL | Status: DC | PRN
  Administered 2012-11-16 – 2012-11-17 (×2): 10 mg via ORAL
  Filled 2012-11-16 (×2): qty 2

## 2012-11-16 MED ORDER — CALCIUM POLYCARBOPHIL 625 MG PO TABS
2500.0000 mg | ORAL_TABLET | Freq: Every day | ORAL | Status: DC
  Administered 2012-11-16 – 2012-11-18 (×3): 2500 mg via ORAL
  Filled 2012-11-16 (×4): qty 4

## 2012-11-16 MED ORDER — METHOCARBAMOL 100 MG/ML IJ SOLN
500.0000 mg | Freq: Four times a day (QID) | INTRAMUSCULAR | Status: DC | PRN
  Administered 2012-11-16 – 2012-11-17 (×2): 500 mg via INTRAVENOUS
  Filled 2012-11-16 (×3): qty 5

## 2012-11-16 MED ORDER — RIVAROXABAN 10 MG PO TABS
10.0000 mg | ORAL_TABLET | Freq: Every day | ORAL | Status: DC
Start: 2012-11-17 — End: 2012-11-19
  Administered 2012-11-17 – 2012-11-19 (×3): 10 mg via ORAL
  Filled 2012-11-16 (×4): qty 1

## 2012-11-16 MED ORDER — PHENYLEPHRINE HCL 10 MG/ML IJ SOLN
10.0000 mg | INTRAVENOUS | Status: DC | PRN
  Administered 2012-11-16: 50 ug/min via INTRAVENOUS

## 2012-11-16 MED ORDER — METOCLOPRAMIDE HCL 5 MG/ML IJ SOLN
5.0000 mg | Freq: Three times a day (TID) | INTRAMUSCULAR | Status: DC | PRN
  Administered 2012-11-17: 10 mg via INTRAVENOUS
  Filled 2012-11-16: qty 2

## 2012-11-16 MED ORDER — ACETAMINOPHEN 325 MG PO TABS: 650.0000 mg | ORAL_TABLET | Freq: Four times a day (QID) | ORAL | Status: DC | PRN

## 2012-11-16 MED ORDER — ACETAMINOPHEN 650 MG RE SUPP: 650.0000 mg | Freq: Four times a day (QID) | RECTAL | Status: DC | PRN

## 2012-11-16 MED ORDER — DOCUSATE SODIUM 100 MG PO CAPS
100.0000 mg | ORAL_CAPSULE | Freq: Two times a day (BID) | ORAL | Status: DC
  Administered 2012-11-16 – 2012-11-19 (×6): 100 mg via ORAL

## 2012-11-16 MED ORDER — ACETAMINOPHEN 10 MG/ML IV SOLN
1000.0000 mg | Freq: Once | INTRAVENOUS | Status: AC
  Administered 2012-11-16: 1000 mg via INTRAVENOUS

## 2012-11-16 MED ORDER — MIDAZOLAM HCL 5 MG/5ML IJ SOLN
INTRAMUSCULAR | Status: DC | PRN
  Administered 2012-11-16 (×2): 2 mg via INTRAVENOUS

## 2012-11-16 MED ORDER — SODIUM CHLORIDE 0.9 % IV SOLN: INTRAVENOUS | Status: DC

## 2012-11-16 MED ORDER — FENTANYL CITRATE 0.05 MG/ML IJ SOLN
INTRAMUSCULAR | Status: DC | PRN
  Administered 2012-11-16: 100 ug via INTRAVENOUS

## 2012-11-16 MED ORDER — OXYCODONE HCL 5 MG/5ML PO SOLN
5.0000 mg | Freq: Once | ORAL | Status: DC | PRN
  Filled 2012-11-16: qty 5

## 2012-11-16 MED ORDER — CEFAZOLIN SODIUM-DEXTROSE 2-3 GM-% IV SOLR
2.0000 g | INTRAVENOUS | Status: AC
  Administered 2012-11-16: 2 g via INTRAVENOUS

## 2012-11-16 MED ORDER — SODIUM CHLORIDE 0.9 % IJ SOLN
INTRAMUSCULAR | Status: DC | PRN
  Administered 2012-11-16: 13:00:00

## 2012-11-16 MED ORDER — PROPOFOL INFUSION 10 MG/ML OPTIME
INTRAVENOUS | Status: DC | PRN
  Administered 2012-11-16: 140 ug/kg/min via INTRAVENOUS

## 2012-11-16 MED ORDER — BUPIVACAINE LIPOSOME 1.3 % IJ SUSP
20.0000 mL | Freq: Once | INTRAMUSCULAR | Status: DC
  Filled 2012-11-16: qty 20

## 2012-11-16 MED ORDER — KCL IN DEXTROSE-NACL 20-5-0.45 MEQ/L-%-% IV SOLN
INTRAVENOUS | Status: DC
  Administered 2012-11-16 – 2012-11-17 (×3): via INTRAVENOUS
  Filled 2012-11-16 (×3): qty 1000

## 2012-11-16 MED ORDER — BUPIVACAINE HCL (PF) 0.5 % IJ SOLN
INTRAMUSCULAR | Status: DC | PRN
  Administered 2012-11-16: 3 mL

## 2012-11-16 MED ORDER — POLYETHYLENE GLYCOL 3350 17 G PO PACK
17.0000 g | PACK | Freq: Every day | ORAL | Status: DC | PRN
  Administered 2012-11-18: 17 g via ORAL

## 2012-11-16 MED ORDER — OXYCODONE HCL 5 MG PO TABS: 5.0000 mg | ORAL_TABLET | Freq: Once | ORAL | Status: DC | PRN

## 2012-11-16 MED ORDER — ACETAMINOPHEN 10 MG/ML IV SOLN: 1000.0000 mg | Freq: Once | INTRAVENOUS | Status: DC | PRN

## 2012-11-16 MED ORDER — METOCLOPRAMIDE HCL 10 MG PO TABS: 5.0000 mg | ORAL_TABLET | Freq: Three times a day (TID) | ORAL | Status: DC | PRN

## 2012-11-16 SURGICAL SUPPLY — 51 items
BAG SPEC THK2 15X12 ZIP CLS (MISCELLANEOUS) ×1
BAG ZIPLOCK 12X15 (MISCELLANEOUS) ×2 IMPLANT
BIT DRILL 2.8X128 (BIT) ×2 IMPLANT
BLADE EXTENDED COATED 6.5IN (ELECTRODE) ×2 IMPLANT
BLADE SAW SAG 73X25 THK (BLADE) ×1
BLADE SAW SGTL 73X25 THK (BLADE) ×1 IMPLANT
CLOTH BEACON ORANGE TIMEOUT ST (SAFETY) ×2 IMPLANT
DECANTER SPIKE VIAL GLASS SM (MISCELLANEOUS) ×2 IMPLANT
DRAPE INCISE IOBAN 66X45 STRL (DRAPES) ×2 IMPLANT
DRAPE ORTHO SPLIT 77X108 STRL (DRAPES) ×4
DRAPE POUCH INSTRU U-SHP 10X18 (DRAPES) ×2 IMPLANT
DRAPE SURG ORHT 6 SPLT 77X108 (DRAPES) ×2 IMPLANT
DRAPE U-SHAPE 47X51 STRL (DRAPES) ×2 IMPLANT
DRSG ADAPTIC 3X8 NADH LF (GAUZE/BANDAGES/DRESSINGS) ×2 IMPLANT
DRSG MEPILEX BORDER 4X4 (GAUZE/BANDAGES/DRESSINGS) ×2 IMPLANT
DRSG MEPILEX BORDER 4X8 (GAUZE/BANDAGES/DRESSINGS) ×2 IMPLANT
DURAPREP 26ML APPLICATOR (WOUND CARE) ×2 IMPLANT
ELECT REM PT RETURN 9FT ADLT (ELECTROSURGICAL) ×2
ELECTRODE REM PT RTRN 9FT ADLT (ELECTROSURGICAL) ×1 IMPLANT
EVACUATOR 1/8 PVC DRAIN (DRAIN) ×2 IMPLANT
FACESHIELD LNG OPTICON STERILE (SAFETY) ×8 IMPLANT
GLOVE BIO SURGEON STRL SZ8 (GLOVE) ×2 IMPLANT
GLOVE BIOGEL PI IND STRL 8 (GLOVE) ×2 IMPLANT
GLOVE BIOGEL PI INDICATOR 8 (GLOVE) ×2
GLOVE ECLIPSE 8.0 STRL XLNG CF (GLOVE) ×2 IMPLANT
GLOVE SURG SS PI 6.5 STRL IVOR (GLOVE) ×4 IMPLANT
GOWN STRL NON-REIN LRG LVL3 (GOWN DISPOSABLE) ×4 IMPLANT
GOWN STRL REIN XL XLG (GOWN DISPOSABLE) ×2 IMPLANT
IMMOBILIZER KNEE 20 (SOFTGOODS)
IMMOBILIZER KNEE 20 THIGH 36 (SOFTGOODS) IMPLANT
KIT BASIN OR (CUSTOM PROCEDURE TRAY) ×2 IMPLANT
LINER MARATHON NEUT +4X52X32 (Hips) IMPLANT
MANIFOLD NEPTUNE II (INSTRUMENTS) ×2 IMPLANT
NDL SAFETY ECLIPSE 18X1.5 (NEEDLE) ×1 IMPLANT
NEEDLE HYPO 18GX1.5 SHARP (NEEDLE) ×2
NS IRRIG 1000ML POUR BTL (IV SOLUTION) ×2 IMPLANT
PACK TOTAL JOINT (CUSTOM PROCEDURE TRAY) ×2 IMPLANT
PASSER SUT SWANSON 36MM LOOP (INSTRUMENTS) ×2 IMPLANT
POSITIONER SURGICAL ARM (MISCELLANEOUS) ×2 IMPLANT
SPONGE GAUZE 4X4 12PLY (GAUZE/BANDAGES/DRESSINGS) ×2 IMPLANT
STRIP CLOSURE SKIN 1/2X4 (GAUZE/BANDAGES/DRESSINGS) ×4 IMPLANT
SUT ETHIBOND NAB CT1 #1 30IN (SUTURE) ×4 IMPLANT
SUT MNCRL AB 4-0 PS2 18 (SUTURE) ×2 IMPLANT
SUT VIC AB 2-0 CT1 27 (SUTURE) ×6
SUT VIC AB 2-0 CT1 TAPERPNT 27 (SUTURE) ×3 IMPLANT
SUT VLOC 180 0 24IN GS25 (SUTURE) ×4 IMPLANT
SYR 50ML LL SCALE MARK (SYRINGE) ×2 IMPLANT
TOWEL OR 17X26 10 PK STRL BLUE (TOWEL DISPOSABLE) ×4 IMPLANT
TOWEL OR NON WOVEN STRL DISP B (DISPOSABLE) ×2 IMPLANT
TRAY FOLEY CATH 14FRSI W/METER (CATHETERS) ×2 IMPLANT
WATER STERILE IRR 1500ML POUR (IV SOLUTION) ×2 IMPLANT

## 2012-11-16 NOTE — Anesthesia Postprocedure Evaluation (Signed)
Anesthesia Post Note  Patient: Colleen Glenn  Procedure(s) Performed: Procedure(s) (LRB): TOTAL HIP ARTHROPLASTY (Right)  Anesthesia type: Spinal  Patient location: PACU  Post pain: Pain level controlled  Post assessment: Post-op Vital signs reviewed  Last Vitals: BP 126/80  Pulse 66  Temp 36.5 C  Resp 16  SpO2 100%  Post vital signs: Reviewed  Level of consciousness: sedated  Complications: No apparent anesthesia complications

## 2012-11-16 NOTE — Progress Notes (Signed)
UR COMPLETED  

## 2012-11-16 NOTE — Anesthesia Preprocedure Evaluation (Addendum)
Anesthesia Evaluation  Patient identified by MRN, date of birth, ID band Patient awake    Reviewed: Allergy & Precautions, H&P , NPO status , Patient's Chart, lab work & pertinent test results  Airway Mallampati: I TM Distance: >3 FB Neck ROM: Full    Dental  (+) Teeth Intact and Dental Advisory Given,    Pulmonary former smoker,  breath sounds clear to auscultation  Pulmonary exam normal       Cardiovascular hypertension, Pt. on medications - Peripheral Vascular Disease Rhythm:Regular Rate:Normal     Neuro/Psych PSYCHIATRIC DISORDERS Anxiety Depression negative neurological ROS     GI/Hepatic Neg liver ROS, GERD-  Medicated and Controlled,  Endo/Other  negative endocrine ROS  Renal/GU negative Renal ROS     Musculoskeletal  (+) Arthritis -, Osteoarthritis,    Abdominal Normal abdominal exam  (+)   Peds  Hematology negative hematology ROS (+)   Anesthesia Other Findings   Reproductive/Obstetrics                           Anesthesia Physical  Anesthesia Plan  ASA: II  Anesthesia Plan: Spinal   Post-op Pain Management:    Induction:   Airway Management Planned:   Additional Equipment:   Intra-op Plan:   Post-operative Plan:   Informed Consent: I have reviewed the patients History and Physical, chart, labs and discussed the procedure including the risks, benefits and alternatives for the proposed anesthesia with the patient or authorized representative who has indicated his/her understanding and acceptance.   Dental advisory given  Plan Discussed with: CRNA  Anesthesia Plan Comments:        Anesthesia Quick Evaluation

## 2012-11-16 NOTE — Transfer of Care (Signed)
Immediate Anesthesia Transfer of Care Note  Patient: Colleen Glenn  Procedure(s) Performed: Procedure(s) (LRB) with comments: TOTAL HIP ARTHROPLASTY (Right) - Right Total Hip Arthroplasty  Patient Location: PACU  Anesthesia Type:MAC and Spinal  Level of Consciousness: awake and alert   Airway & Oxygen Therapy: Patient Spontanous Breathing and Patient connected to face mask oxygen  Post-op Assessment: Report given to PACU RN and Post -op Vital signs reviewed and stable  Post vital signs: Reviewed and stable  Complications: No apparent anesthesia complications

## 2012-11-16 NOTE — Progress Notes (Signed)
Patient vomited without warning after dangles. Patient relay she has GERDS & takes Prilosec at home twice a day. Can tolerate protonix T.O.V. Via Alphonsa Overall PA for protonix 40mg  po twice a day.

## 2012-11-16 NOTE — Op Note (Signed)
Pre-operative diagnosis- Osteoarthritis Right hip  Post-operative diagnosis- Osteoarthritis  Right hip  Procedure-  RightTotal Hip Arthroplasty  Surgeon- Gus Rankin. Takuya Lariccia, MD  Assistant- Avel Peace, PA-C   Anesthesia  Spinal  EBL- 150   Drain Hemovac   Complication- None  Condition-PACU - hemodynamically stable.   Brief Clinical Note-  Colleen Glenn is a 72 y.o. female with end stage arthritis of her right hip with progressively worsening pain and dysfunction. Pain occurs with activity and rest including pain at night. She has tried analgesics, protected weight bearing and rest without benefit. Pain is too severe to attempt physical therapy. Radiographs demonstrate bone on bone arthritis with subchondral cyst formation. She presents now for right THA.  Procedure in detail-   The patient is brought into the operating room and placed on the operating table. After successful administration of Spinal  anesthesia, the patient is placed in the  Left lateral decubitus position with the  Right side up and held in place with the hip positioner. The lower extremity is isolated from the perineum with plastic drapes and time-out is performed by the surgical team. The lower extremity is then prepped and draped in the usual sterile fashion. A short posterolateral incision is made with a ten blade through the subcutaneous tissue to the level of the fascia lata which is incised in line with the skin incision. The sciatic nerve is palpated and protected and the short external rotators and capsule are isolated from the femur. The hip is then dislocated and the center of the femoral head is marked. A trial prosthesis is placed such that the trial head corresponds to the center of the patients' native femoral head. The resection level is marked on the femoral neck and the resection is made with an oscillating saw. The femoral head is removed and femoral retractors placed to gain access to the femoral  canal.      The canal finder is passed into the femoral canal and the canal is thoroughly irrigated with sterile saline to remove the fatty contents. Axial reaming is performed to 13.5  mm, proximal reaming to 18D  and the sleeve machined to a small. A 18 D small trial sleeve is placed into the proximal femur.      The femur is then retracted anteriorly to gain acetabular exposure. Acetabular retractors are placed and the labrum and osteophytes are removed, Acetabular reaming is performed to 51  mm and a 52  mm Pinnacle acetabular shell is placed in anatomic position with excellent purchase. Additional dome screws were not needed. The permanent 32 mm neutral + 4 Marathon liner is placed into the acetabular shell.      The trial femur is then placed into the femoral canal. The size is 18 x 13  stem with a 36 +12  neck and a 32 + 3 head with the neck version 10 degrees beyond  the patients' native anteversion. The hip is reduced with excellent stability with full extension and full external rotation, 70 degrees flexion with 40 degrees adduction and 90 degrees internal rotation and 90 degrees of flexion with 70 degrees of internal rotation. The operative leg is placed on top of the non-operative leg and the leg lengths are found to be equal. The trials are then removed and the permanent implant of the same size is impacted into the femoral canal. The ceramic femoral head of the same size as the trial is placed and the hip is reduced with the same  stability parameters. The operative leg is again placed on top of the non-operative leg and the leg lengths are found to be equal.      The wound is then copiously irrigated with saline solution and the capsule and short external rotators are re-attached to the femur through drill holes with Ethibond suture. The fascia lata is closed over a hemovac drain with #1 vicryl suture and the fascia lata, gluteal muscles and subcutaneous tissues are injected with Exparel 20ml  diluted with saline 50ml. The subcutaneous tissues are closed with #1 and2-0 vicryl and the subcuticular layer closed with running 4-0 Monocryl. The drain is hooked to suction, incision cleaned and dried, and steri-srips and a bulky sterile dressing applied. The limb is placed into a knee immobilizer and the patient is awakened and transported to recovery in stable condition.      Please note that a surgical assistant was a medical necessity for this procedure in order to perform it in a safe and expeditious manner. The assistant was necessary to provide retraction to the vital neurovascular structures and to retract and position the limb to allow for anatomic placement of the prosthetic components.  Gus Rankin Jamonta Goerner, MD    11/16/2012, 1:25 PM

## 2012-11-16 NOTE — Interval H&P Note (Signed)
History and Physical Interval Note:  11/16/2012 11:26 AM  Colleen Glenn  has presented today for surgery, with the diagnosis of Osteoarthritis of the Right Hip  The various methods of treatment have been discussed with the patient and family. After consideration of risks, benefits and other options for treatment, the patient has consented to  Procedure(s) (LRB) with comments: TOTAL HIP ARTHROPLASTY (Right) - Right Total Hip Arthroplasty as a surgical intervention .  The patient's history has been reviewed, patient examined, no change in status, stable for surgery.  I have reviewed the patient's chart and labs.  Questions were answered to the patient's satisfaction.     Loanne Drilling

## 2012-11-16 NOTE — Anesthesia Procedure Notes (Signed)
Spinal  Patient location during procedure: OR Start time: 11/16/2012 12:22 PM End time: 11/16/2012 12:27 PM Staffing Anesthesiologist: Lewie Loron R Performed by: anesthesiologist  Preanesthetic Checklist Completed: patient identified, site marked, surgical consent, pre-op evaluation, timeout performed, IV checked, risks and benefits discussed and monitors and equipment checked Spinal Block Patient position: sitting Prep: ChloraPrep Patient monitoring: continuous pulse ox, blood pressure and heart rate Approach: midline Location: L2-3 Injection technique: single-shot Needle Needle type: Quincke  Needle gauge: 22 G Needle length: 9 cm Assessment Sensory level: T8

## 2012-11-16 NOTE — H&P (View-Only) (Signed)
Colleen Glenn  DOB: [birth year] Married / Language: English / Race: White Female  Date of Admission:  11/16/2012  Chief Complaint:  Right Hip Pain  History of Present Illness The patient is a 72 year old female who comes in for a preoperative History and Physical. The patient is scheduled for a right total hip arthroplasty to be performed by Dr. Gus Rankin. Aluisio, MD at [facility] on 11/16/2012. The patient is a 72 year old female who presents for follow up of their hip. The patient is being followed for their right hip pain and osteoarthritis. They are 2 month(s) out from last visit. Symptoms reported today include: pain. The patient indicates that they have questions or concerns today regarding surgery (She is scheduled for surgery January 29th.). She still has considerable pain in that hip. It is bothering her at most times. She does not have any pain in the left hip. The right hip does hurt at night but not every single night. It is limiting what she can and can not do. She decided not to have the intra-articular injection and opted instead for hip replacement. She is ready to proceed with surgery. They have been treated conservatively in the past for the above stated problem and despite conservative measures, they continue to have progressive pain and severe functional limitations and dysfunction. They have failed non-operative management including home exercise, medications. It is felt that they would benefit from undergoing total joint replacement. Risks and benefits of the procedure have been discussed with the patient and they elect to proceed with surgery. There are no active contraindications to surgery such as ongoing infection or rapidly progressive neurological disease.   Problem List Osteoarthritis, Hip (715.35)   Allergies No Known Drug Allergies. 08/04/2012   Family History Hypertension. mother Osteoporosis. mother Heart Disease. mother Cancer.  father Congestive Heart Failure. mother   Social History Current work status. retired Financial planner (Currently). no Drug/Alcohol Rehab (Previously). no Children. 3 Alcohol use. current drinker; drinks wine; only occasionally per week Exercise. Exercises weekly; does running / walking Pain Contract. no Number of flights of stairs before winded. greater than 5 Illicit drug use. no Living situation. live with spouse Marital status. married Post-Surgical Plans. Plan is to go home.   Medication History BuPROPion HCl ER (XL) ( Oral) Specific dose unknown - Active. Simvastatin (40MG  Tablet, Oral) Active. Aspirin EC (81MG  Tablet DR, Oral) Active. Vitamin D ( Oral) Specific dose unknown - Active. Fiber Complete ( Oral) Specific dose unknown - Active. Stool Softener ( Oral) Specific dose unknown - Active. Omeprazole (10MG  Capsule DR, Oral) Active.   Past Surgical History Resection of Stomach Spinal Surgery Tonsillectomy Other Orthopaedic Surgery Colon Polyp Removal - Colonoscopy Hemorrhoidectomy Hysterectomy. partial (non-cancerous) Tubal Ligation   Medical History High blood pressure Hypercholesterolemia Depression Shingles Gastroesophageal Reflux Disease Hemorrhoids Diverticulosis   Vitals Weight: 175 lb Height: 66 in Body Surface Area: 1.92 m Body Mass Index: 28.25 kg/m Pulse: 108 (Regular) Resp.: 20 (Unlabored) BP: 144/82 (Sitting, Left Arm, Standard)    Physical Exam The physical exam findings are as follows:   General Mental Status - Alert, cooperative and good historian. General Appearance- pleasant. Not in acute distress. Orientation- Oriented X3. Build & Nutrition- Well nourished and Well developed.   Head and Neck Head- normocephalic, atraumatic . Neck Global Assessment- supple. no bruit auscultated on the right and no bruit auscultated on the left.   Eye Vision- Wears corrective lenses. Pupil-  Bilateral- Regular and Round. Motion- Bilateral-  EOMI.   Chest and Lung Exam Auscultation: Breath sounds:- clear at anterior chest wall and - clear at posterior chest wall. Adventitious sounds:- No Adventitious sounds.   Cardiovascular Auscultation:Rhythm- Regular rate and rhythm. Heart Sounds- S1 WNL and S2 WNL. Murmurs & Other Heart Sounds:Auscultation of the heart reveals - No Murmurs.   Abdomen Palpation/Percussion:Tenderness- Abdomen is non-tender to palpation. Rigidity (guarding)- Abdomen is soft. Auscultation:Auscultation of the abdomen reveals - Bowel sounds normal.   Female Genitourinary Not done, not pertinent to present illness  Musculoskeletal On exam well developed female alert and oriented in no apparent distress. Her left hip has normal range of motion with no discomfort. The right hip has flexion to 95. Minimal internal rotation about 10, 20 external rotation, 20 abduction with pain on range of motion.  RADIOGRAPHS: X-rays -She has bone on bone arthritis in the right hip.  Assessment & Plan Osteoarthritis, Hip (715.35) Impression: Right Hip  Note: Plan is for a Right Total Hip Replacement by Dr. Lequita Halt.  Plan is to go home following the hospital stay.  PCP - Dr. Rodrigo Ran  Signed electronically by Roberts Gaudy, PA-C

## 2012-11-17 ENCOUNTER — Encounter (HOSPITAL_COMMUNITY): Payer: Self-pay | Admitting: Orthopedic Surgery

## 2012-11-17 LAB — BASIC METABOLIC PANEL
BUN: 10 mg/dL (ref 6–23)
Chloride: 100 mEq/L (ref 96–112)
GFR calc Af Amer: >90 mL/min (ref >90)
GFR calc non Af Amer: >90 mL/min (ref >90)
Potassium: 3.9 mEq/L (ref 3.5–5.1)

## 2012-11-17 LAB — CBC
MCHC: 32.8 g/dL (ref 30.0–36.0)
Platelets: 197 10*3/uL (ref 150–400)
RDW: 13.7 % (ref 11.5–15.5)
WBC: 7.9 10*3/uL (ref 4.0–10.5)

## 2012-11-17 MED ORDER — PROMETHAZINE HCL 25 MG/ML IJ SOLN
12.5000 mg | Freq: Four times a day (QID) | INTRAMUSCULAR | Status: DC | PRN
  Administered 2012-11-17: 12.5 mg via INTRAVENOUS
  Filled 2012-11-17: qty 1

## 2012-11-17 MED ORDER — HYDROMORPHONE HCL PF 1 MG/ML IJ SOLN
0.5000 mg | INTRAMUSCULAR | Status: DC | PRN
  Administered 2012-11-17 (×2): 1 mg via INTRAVENOUS
  Filled 2012-11-17 (×2): qty 1

## 2012-11-17 MED ORDER — NON FORMULARY: 20.0000 mg | Freq: Two times a day (BID) | Status: DC

## 2012-11-17 MED ORDER — CYCLOBENZAPRINE HCL 10 MG PO TABS
10.0000 mg | ORAL_TABLET | Freq: Three times a day (TID) | ORAL | Status: DC | PRN
  Administered 2012-11-19: 10 mg via ORAL
  Filled 2012-11-17: qty 1

## 2012-11-17 MED ORDER — OMEPRAZOLE 20 MG PO CPDR
20.0000 mg | DELAYED_RELEASE_CAPSULE | Freq: Two times a day (BID) | ORAL | Status: DC
  Administered 2012-11-17 – 2012-11-19 (×5): 20 mg via ORAL
  Filled 2012-11-17 (×6): qty 1

## 2012-11-17 MED ORDER — HYDROMORPHONE HCL 2 MG PO TABS
2.0000 mg | ORAL_TABLET | ORAL | Status: DC | PRN
  Administered 2012-11-17: 2 mg via ORAL
  Administered 2012-11-18 (×2): 4 mg via ORAL
  Filled 2012-11-17: qty 2
  Filled 2012-11-17: qty 1
  Filled 2012-11-17: qty 2

## 2012-11-17 NOTE — Progress Notes (Signed)
OT Cancellation Note  Patient Details Name: Colleen Glenn MRN: [id number] DOB: 1940/11/12   Cancelled Treatment:    Reason Eval/Treat Not Completed: Other (comment) (pt with nausea.  Will return tomorrow)  Colleen Glenn 11/17/2012, 1:35 PM Marica Otter, OTR/L [phone] 11/17/2012

## 2012-11-17 NOTE — Progress Notes (Signed)
   Subjective: 1 Day Post-Op Procedure(s) (LRB): TOTAL HIP ARTHROPLASTY (Right) Patient reports pain as moderate.   Patient seen in rounds with Dr. Lequita Halt.  Daughter in room at bedside. Patient is having problems with pain in the hip and thigh, requiring pain medications We will start therapy today.  Plan is to go Home after hospital stay.  Objective: Vital signs in last 24 hours: Temp:  [97.5 F (36.4 C)-98.6 F (37 C)] 98.2 F (36.8 C) (01/28 0600) Pulse Rate:  [63-101] 88  (01/28 0600) Resp:  [9-22] 16  (01/28 0600) BP: (87-157)/(51-88) 137/76 mmHg (01/28 0600) SpO2:  [94 %-100 %] 99 % (01/28 0600) FiO2 (%):  [100 %] 100 % (01/27 1515) Weight:  [81.647 kg (180 lb)] 81.647 kg (180 lb) (01/27 1515)  Intake/Output from previous day:  Intake/Output Summary (Last 24 hours) at 11/17/12 0714 Last data filed at 11/17/12 9562  Gross per 24 hour  Intake 3451.25 ml  Output   1431 ml  Net 2020.25 ml    Intake/Output this shift: UOP 425 since MN +1920  Labs:  [facility] 11/17/12 0415  HGB 10.9*    Basename 11/17/12 0415  WBC 7.9  RBC 3.55*  HCT 33.2*  PLT 197    Basename 11/17/12 0415  NA 132*  K 3.9  CL 100  CO2 25  BUN 10  CREATININE 0.59  GLUCOSE 139*  CALCIUM 8.1*   No results found for this basename: LABPT:2,INR:2 in the last 72 hours  EXAM General - Patient is Alert, Appropriate and Oriented Extremity - Neurovascular intact Sensation intact distally Dorsiflexion/Plantar flexion intact Dressing - dressing C/D/I Motor Function - intact, moving foot and toes well on exam.  Hemovac pulled without difficulty.  Past Medical History  Diagnosis Date  . Depression   . Hyperlipidemia   . GERD (gastroesophageal reflux disease)   . Arthritis   . Plantar fasciitis     hx of    Assessment/Plan: 1 Day Post-Op Procedure(s) (LRB): TOTAL HIP ARTHROPLASTY (Right) Principal Problem:  *OA (osteoarthritis) of hip  Estimated Body mass index is 29.05 kg/(m^2)  as calculated from the following:   Height as of this encounter: 5\' 6" (1.676 m).   Weight as of this encounter: 180 lb(81.647 kg). Advance diet Up with therapy Discharge home with home health when improved  DVT Prophylaxis - Xarelto, ASA 81 mg on hold Weight Bearing As Tolerated right Leg D/C Knee Immobilizer Hemovac Pulled Begin Therapy Hip Preacutions No vaccines.  PERKINS, ALEXZANDREW 11/17/2012, 7:14 AM

## 2012-11-17 NOTE — Progress Notes (Signed)
Physical Therapy Treatment Patient Details Name: RAUL WINTERHALTER MRN: [id number] DOB: June 15, 1941 Today's Date: 11/17/2012 Time: 4098-1191 PT Time Calculation (min): 39 min  PT Assessment / Plan / Recommendation Comments on Treatment Session  Pt. is bothered by nausea and limiting activity. continue PT as tolerated.    Follow Up Recommendations  Home health PT     Does the patient have the potential to tolerate intense rehabilitation     Barriers to Discharge        Equipment Recommendations  Rolling walker with 5" wheels    Recommendations for Other Services    Frequency 7X/week   Plan Discharge plan remains appropriate;Frequency remains appropriate    Precautions / Restrictions Precautions Precautions: Posterior Hip   Pertinent Vitals/Pain Pain 4/10 got meds for pain and nausea.    Mobility  Transfers Sit to Stand: 4: Min assist;With upper extremity assist;With armrests;From chair/3-in-1 Stand to Sit: To chair/3-in-1;With armrests;4: Min assist Details for Transfer Assistance: cues to push from recliner and for Rleg placement. Ambulation/Gait Ambulation/Gait Assistance: 3: Mod assist Ambulation Distance (Feet): 5 Feet Assistive device: Rolling walker Ambulation/Gait Assistance Details: Pt. beacame nauseated and needed to sit down . + emesis x 3 Gait Pattern: Step-to pattern;Antalgic;Decreased step length - right;Decreased stance time - right    Exercises     PT Diagnosis:    PT Problem List:   PT Treatment Interventions:     PT Goals Acute Rehab PT Goals Pt will go Sit to Supine/Side: with supervision PT Goal: Sit to Supine/Side - Progress: Progressing toward goal Pt will go Sit to Stand: with supervision PT Goal: Sit to Stand - Progress: Progressing toward goal Pt will go Stand to Sit: with supervision PT Goal: Stand to Sit - Progress: Progressing toward goal Pt will Ambulate: 51 - 150 feet PT Goal: Ambulate - Progress: Progressing toward goal Additional  Goals Additional Goal #1: state and demo 3/3 posterior hip precautions. PT Goal: Additional Goal #1 - Progress: Goal set today  Visit Information  Last PT Received On: 11/17/12 Assistance Needed: +2    Subjective Data  Subjective: I am still  nauseated   Cognition  Overall Cognitive Status: Appears within functional limits for tasks assessed/performed Arousal/Alertness: Awake/alert    Balance     End of Session PT - End of Session Activity Tolerance: Patient limited by fatigue (emesis) Patient left: in chair;with call bell/phone within reach Nurse Communication: Mobility status   GP     Rada Hay 11/17/2012, 3:00 PM

## 2012-11-17 NOTE — Evaluation (Signed)
Physical Therapy Evaluation Patient Details Name: Colleen Glenn MRN: [id number] DOB: Jun 12, 1941 Today's Date: 11/17/2012 Time: 0821-0855 PT Time Calculation (min): 34 min  PT Assessment / Plan / Recommendation Clinical Impression  Pt s/p R THR presents with decreased R LE strength/ROM, post THP and post op pain limiting functional mobility    PT Assessment  Patient needs continued PT services    Follow Up Recommendations  Home health PT    Does the patient have the potential to tolerate intense rehabilitation      Barriers to Discharge None      Equipment Recommendations  Rolling walker with 5" wheels    Recommendations for Other Services OT consult   Frequency 7X/week    Precautions / Restrictions Precautions Precautions: Posterior Hip;Fall Precaution Booklet Issued: Yes (comment) Restrictions Weight Bearing Restrictions: No Other Position/Activity Restrictions: WBAT   Pertinent Vitals/Pain 5/10; premed; ice pack provided      Mobility  Bed Mobility Bed Mobility: Supine to Sit Supine to Sit: 3: Mod assist Details for Bed Mobility Assistance: increased time with cues for sequence and THP Transfers Transfers: Sit to Stand;Stand to Sit Sit to Stand: 1: +2 Total assist;From bed;With upper extremity assist;From elevated surface Sit to Stand: Patient Percentage: 60% Stand to Sit: 1: +2 Total assist Stand to Sit: Patient Percentage: 60% Details for Transfer Assistance: cues and assist for use of UEs and LE management Ambulation/Gait Ambulation/Gait Assistance: 1: +2 Total assist Ambulation/Gait: Patient Percentage: 70% Ambulation Distance (Feet): 5 Feet Assistive device: Rolling walker Ambulation/Gait Assistance Details: cues for posture, position from RW and sequence Gait Pattern: Step-to pattern    Shoulder Instructions     Exercises Total Joint Exercises Ankle Circles/Pumps: AROM;10 reps;Supine;Both Quad Sets: AROM;Both;10 reps;Supine Heel Slides:  AAROM;10 reps;Supine;Right Hip ABduction/ADduction: AAROM;10 reps;Right;Supine   PT Diagnosis: Difficulty walking  PT Problem List: Decreased strength;Decreased range of motion;Decreased activity tolerance;Decreased mobility;Decreased knowledge of use of DME;Pain;Decreased knowledge of precautions PT Treatment Interventions: DME instruction;Gait training;Stair training;Functional mobility training;Therapeutic activities;Therapeutic exercise;Patient/family education   PT Goals Acute Rehab PT Goals PT Goal Formulation: With patient Time For Goal Achievement: 11/23/12 Potential to Achieve Goals: Good Pt will go Supine/Side to Sit: with supervision PT Goal: Supine/Side to Sit - Progress: Goal set today Pt will go Sit to Supine/Side: with supervision PT Goal: Sit to Supine/Side - Progress: Goal set today Pt will go Sit to Stand: with supervision PT Goal: Sit to Stand - Progress: Goal set today Pt will go Stand to Sit: with supervision PT Goal: Stand to Sit - Progress: Goal set today Pt will Ambulate: 51 - 150 feet;with supervision;with rolling walker PT Goal: Ambulate - Progress: Goal set today Pt will Go Up / Down Stairs: 3-5 stairs;with min assist;with least restrictive assistive device PT Goal: Up/Down Stairs - Progress: Goal set today  Visit Information  Last PT Received On: 11/17/12 Assistance Needed: +2    Subjective Data  Subjective: If I could just sleep................ Patient Stated Goal: Resume previous lifestyle with decreased pain   Prior Functioning  Home Living Lives With: Spouse Available Help at Discharge: Family Type of Home: House Home Access: Stairs to enter Secretary/administrator of Steps: 3 Entrance Stairs-Rails: Right;Left Home Layout: One level Home Adaptive Equipment: None Prior Function Level of Independence: Independent Able to Take Stairs?: Yes Driving: Yes Communication Communication: No difficulties    Cognition  Overall Cognitive Status:  Appears within functional limits for tasks assessed/performed Arousal/Alertness: Awake/alert Orientation Level: Appears intact for tasks assessed Behavior During Session: [facility]  for tasks performed    Extremity/Trunk Assessment Right Upper Extremity Assessment RUE ROM/Strength/Tone: [facility] for tasks assessed Left Upper Extremity Assessment LUE ROM/Strength/Tone: WFL for tasks assessed Right Lower Extremity Assessment RLE ROM/Strength/Tone: Deficits RLE ROM/Strength/Tone Deficits: hip strnegth 2-/5 with AAROM to 75 flex and 10 abd Left Lower Extremity Assessment LLE ROM/Strength/Tone: WFL for tasks assessed   Balance    End of Session PT - End of Session Equipment Utilized During Treatment: Gait belt Activity Tolerance: Patient limited by fatigue Patient left: in chair;with call bell/phone within reach;with family/visitor present Nurse Communication: Mobility status  GP     Colleen Glenn 11/17/2012, 8:56 AM

## 2012-11-17 NOTE — Care Management Note (Addendum)
    Page 1 of 2   11/19/2012     11:47:45 AM   CARE MANAGEMENT NOTE 11/19/2012  Patient:  Colleen Glenn, Colleen Glenn   Account Number:  [id number]  Date Initiated:  11/17/2012  Documentation initiated by:  Colleen Can  Subjective/Objective Assessment:   DX Osteoarthritis rt hip; total hip replacemnt on day of admission     Action/Plan:   CM spoke with patient and spouse. Plans are for patient to return to her home where spouse will be caregiver.  She will need rw and hhpt.   Anticipated DC Date:  11/18/2012   Anticipated DC Plan:  HOME W HOME HEALTH SERVICES  In-house referral  NA      DC Planning Services  CM consult      PAC Choice  DURABLE MEDICAL EQUIPMENT  HOME HEALTH   Choice offered to / List presented to:  C-1 Patient   DME arranged  3-N-1  Levan Hurst      DME agency  [facility]     HH arranged  HH-2 PT  HH-3 OT      [facility] agency  [facility]   Status of service:  Completed, signed off Medicare Important Message given?  NA - LOS <3 / Initial given by admissions (If response is "NO", the following Medicare IM given date fields will be blank) Date Medicare IM given:   Date Additional Medicare IM given:    Discharge Disposition:  HOME W HOME HEALTH SERVICES  Per UR Regulation:    If discussed at Long Length of Stay Meetings, dates discussed:    Comments:  11/19/2012 Colleen Can BSN RN CCM [phone] Pt for discharge today/ HH ot added to requested services. [facility] rep notified.  11/17/2012 Colleen Can BSN RN CCM [phone] [facility] called to request services for HHpt and RW. [facility] can provide services with start of hh services on day after pt is discharged.

## 2012-11-18 DIAGNOSIS — D62 Acute posthemorrhagic anemia: Secondary | ICD-10-CM | POA: Diagnosis not present

## 2012-11-18 DIAGNOSIS — E871 Hypo-osmolality and hyponatremia: Secondary | ICD-10-CM | POA: Diagnosis not present

## 2012-11-18 LAB — BASIC METABOLIC PANEL
CO2: 28 mEq/L (ref 19–32)
Calcium: 8.9 mg/dL (ref 8.4–10.5)
Chloride: 94 mEq/L — ABNORMAL LOW (ref 96–112)
Creatinine, Ser: 0.63 mg/dL (ref 0.50–1.10)
Glucose, Bld: 123 mg/dL — ABNORMAL HIGH (ref 70–99)
Sodium: 131 mEq/L — ABNORMAL LOW (ref 135–145)

## 2012-11-18 LAB — CBC
Hemoglobin: 11 g/dL — ABNORMAL LOW (ref 12.0–15.0)
MCH: 30.2 pg (ref 26.0–34.0)
MCV: 92.3 fL (ref 78.0–100.0)
Platelets: 214 10*3/uL (ref 150–400)
RBC: 3.64 MIL/uL — ABNORMAL LOW (ref 3.87–5.11)
WBC: 12.4 10*3/uL — ABNORMAL HIGH (ref 4.0–10.5)

## 2012-11-18 MED ORDER — HYDROCODONE-ACETAMINOPHEN 10-325 MG PO TABS
2.0000 | ORAL_TABLET | ORAL | Status: DC | PRN
  Administered 2012-11-18 – 2012-11-19 (×6): 2 via ORAL
  Filled 2012-11-18 (×6): qty 2

## 2012-11-18 NOTE — Progress Notes (Signed)
11/18/12 1400  PT Visit Information  Last PT Received On 11/18/12  Assistance Needed +1  PT Time Calculation  PT Start Time 1419  PT Stop Time 1439  PT Time Calculation (min) 20 min  Precautions  Precautions Posterior Hip  Precaution Comments Able to recall 2/3 hip precautions; reviewed THP again with regard to transfers and bed mobility  Restrictions  Other Position/Activity Restrictions WBAT  Cognition  Overall Cognitive Status Appears within functional limits for tasks assessed/performed  Arousal/Alertness Awake/alert  Orientation Level Appears intact for tasks assessed  Behavior During Session [facility] for tasks performed  Bed Mobility  Bed Mobility Supine to Sit;Sit to Supine  Supine to Sit 4: Min assist;HOB flat  Sit to Supine 4: Min assist;HOB flat  Details for Bed Mobility Assistance increased time with cues for sequence and THP  Transfers  Transfers Sit to Stand;Stand to Sit  Sit to Stand 4: Min assist;With upper extremity assist;From elevated surface;From bed  Stand to Sit 4: Min assist;To bed;With upper extremity assist  Details for Transfer Assistance cues for hand placement and THP  Ambulation/Gait  Ambulation/Gait Assistance 4: Min guard;4: Min assist  Ambulation Distance (Feet) 80 Feet  Assistive device Rolling walker  Ambulation/Gait Assistance Details cues for sequence and RLE position in walker  Gait Pattern Step-to pattern;Antalgic;Decreased step length - right;Decreased stance time - right  Total Joint Exercises  Ankle Circles/Pumps AROM;10 reps;Both  Quad Sets AROM;Both;10 reps  Heel Slides AAROM;10 reps;Supine;Right  Hip ABduction/ADduction AAROM;10 reps;Right;Supine  PT - End of Session  Activity Tolerance Patient tolerated treatment well  Patient left in bed;with call bell/phone within reach  Nurse Communication Mobility status  PT - Assessment/Plan  Comments on Treatment Session improving; will be ready to do one step in am   PT Plan Discharge plan  remains appropriate;Frequency remains appropriate  PT Frequency 7X/week  Follow Up Recommendations Home health PT  PT equipment Rolling walker with 5" wheels  Acute Rehab PT Goals  Time For Goal Achievement 11/23/12  Potential to Achieve Goals Good  Pt will go Supine/Side to Sit with supervision  PT Goal: Supine/Side to Sit - Progress Progressing toward goal  Pt will go Sit to Supine/Side with supervision  PT Goal: Sit to Supine/Side - Progress Progressing toward goal  Pt will go Sit to Stand with supervision  PT Goal: Sit to Stand - Progress Progressing toward goal  Pt will go Stand to Sit with supervision  PT Goal: Stand to Sit - Progress Progressing toward goal  Pt will Ambulate 51 - 150 feet;with supervision;with rolling walker  PT Goal: Ambulate - Progress Progressing toward goal  Additional Goals  Additional Goal #1 state and demo 3/3 posterior hip precautions.  PT Goal: Additional Goal #1 - Progress Progressing toward goal  PT General Charges  $$ ACUTE PT VISIT 1 Procedure  PT Treatments  $Gait Training 8-22 mins

## 2012-11-18 NOTE — Progress Notes (Signed)
   Subjective: 2 Days Post-Op Procedure(s) (LRB): TOTAL HIP ARTHROPLASTY (Right) Patient reports pain as moderate.   Patient seen in rounds by Dr. Lequita Halt.  Daughter at bedside. Patient is having problems with pain in the hip and thigh, requiring pain medications Plan is to go Home after hospital stay.  She needs another day.  Objective: Vital signs in last 24 hours: Temp:  [98.2 F (36.8 C)-99.2 F (37.3 C)] 98.8 F (37.1 C) (01/29 0527) Pulse Rate:  [69-105] 105  (01/29 0527) Resp:  [14-20] 16  (01/29 0527) BP: (146-156)/(65-85) 156/65 mmHg (01/29 0527) SpO2:  [93 %-100 %] 96 % (01/29 0527)  Intake/Output from previous day:  Intake/Output Summary (Last 24 hours) at 11/18/12 0724 Last data filed at 11/18/12 5188  Gross per 24 hour  Intake 1826.59 ml  Output   1850 ml  Net -23.41 ml    Intake/Output this shift:    Labs:  Basename 11/18/12 0439 11/17/12 0415  HGB 11.0* 10.9*    Basename 11/18/12 0439 11/17/12 0415  WBC 12.4* 7.9  RBC 3.64* 3.55*  HCT 33.6* 33.2*  PLT 214 197    Basename 11/18/12 0439 11/17/12 0415  NA 131* 132*  K 4.1 3.9  CL 94* 100  CO2 28 25  BUN 10 10  CREATININE 0.63 0.59  GLUCOSE 123* 139*  CALCIUM 8.9 8.1*   No results found for this basename: LABPT:2,INR:2 in the last 72 hours  EXAM General - Patient is Alert, Appropriate and Oriented Extremity - Neurovascular intact Sensation intact distally Dorsiflexion/Plantar flexion intact No cellulitis present Dressing/Incision - clean, dry, no drainage, healing Motor Function - intact, moving foot and toes well on exam.   Past Medical History  Diagnosis Date  . Depression   . Hyperlipidemia   . GERD (gastroesophageal reflux disease)   . Arthritis   . Plantar fasciitis     hx of    Assessment/Plan: 2 Days Post-Op Procedure(s) (LRB): TOTAL HIP ARTHROPLASTY (Right) Principal Problem:  *OA (osteoarthritis) of hip Active Problems:  Postop Hyponatremia  Postop Acute blood loss  anemia  Estimated Body mass index is 29.05 kg/(m^2) as calculated from the following:   Height as of this encounter: 5\' 6" (1.676 m).   Weight as of this encounter: 180 lb(81.647 kg). Up with therapy Plan for discharge tomorrow Discharge home with home health  DVT Prophylaxis - Xarelto, ASA 81 mg on hold Weight Bearing As Tolerated right Leg Change pain medication to Norco 10 mg.  PERKINS, ALEXZANDREW 11/18/2012, 7:24 AM

## 2012-11-18 NOTE — Progress Notes (Signed)
Physical Therapy Treatment Patient Details Name: Colleen Glenn MRN: [id number] DOB: 28-May-1941 Today's Date: 11/18/2012 Time: 4098-1191 PT Time Calculation (min): 21 min  PT Assessment / Plan / Recommendation Comments on Treatment Session  pt feeling better today; slowly improving with mobility, not ready to D/C today;    Follow Up Recommendations  Home health PT     Does the patient have the potential to tolerate intense rehabilitation     Barriers to Discharge        Equipment Recommendations  Rolling walker with 5" wheels    Recommendations for Other Services    Frequency 7X/week   Plan Discharge plan remains appropriate;Frequency remains appropriate    Precautions / Restrictions Precautions Precautions: Posterior Hip Precaution Booklet Issued: Yes (comment) Precaution Comments: Able to recall 2/3 hip precautions Restrictions Weight Bearing Restrictions: No Other Position/Activity Restrictions: WBAT   Pertinent Vitals/Pain     Mobility  Transfers Transfers: Sit to Stand;Stand to Sit Sit to Stand: 4: Min assist;With upper extremity assist;With armrests;From chair/3-in-1 Stand to Sit: To chair/3-in-1;With armrests;4: Min assist Details for Transfer Assistance: cues to push from recliner and for Rleg placement. Ambulation/Gait Ambulation/Gait Assistance: 4: Min assist Ambulation Distance (Feet): 50 Feet Assistive device: Rolling walker Ambulation/Gait Assistance Details: cues for RLE position, THP, sequence and to increase WBing RLE during stance Gait Pattern: Step-to pattern;Antalgic;Decreased step length - right;Decreased stance time - right    Exercises Total Joint Exercises Ankle Circles/Pumps: AROM;10 reps;Both Quad Sets: AROM;Both;10 reps   PT Diagnosis:    PT Problem List:   PT Treatment Interventions:     PT Goals Acute Rehab PT Goals Time For Goal Achievement: 11/23/12 Potential to Achieve Goals: Good Pt will go Sit to Stand: with  supervision PT Goal: Sit to Stand - Progress: Progressing toward goal Pt will go Stand to Sit: with supervision PT Goal: Stand to Sit - Progress: Progressing toward goal Pt will Ambulate: 51 - 150 feet;with supervision;with rolling walker PT Goal: Ambulate - Progress: Progressing toward goal Pt will Go Up / Down Stairs: 3-5 stairs;with min assist;with least restrictive assistive device PT Goal: Up/Down Stairs - Progress: Progressing toward goal Additional Goals Additional Goal #1: state and demo 3/3 posterior hip precautions. PT Goal: Additional Goal #1 - Progress: Progressing toward goal  Visit Information  Last PT Received On: 11/18/12 Assistance Needed: +2 (safety, chair) PT/OT Co-Evaluation/Treatment: Yes (partial)    Subjective Data  Subjective: better   Cognition  Overall Cognitive Status: Appears within functional limits for tasks assessed/performed Arousal/Alertness: Awake/alert Orientation Level: Appears intact for tasks assessed Behavior During Session: [facility] for tasks performed    Balance     End of Session PT - End of Session Activity Tolerance: Patient tolerated treatment well Patient left: in chair;with call bell/phone within reach;with family/visitor present Nurse Communication: Mobility status   GP     [facility] 11/18/2012, 10:54 AM

## 2012-11-19 LAB — CBC
HCT: 32.1 % — ABNORMAL LOW (ref 36.0–46.0)
MCH: 31 pg (ref 26.0–34.0)
MCV: 94.7 fL (ref 78.0–100.0)
Platelets: 197 10*3/uL (ref 150–400)
RBC: 3.39 MIL/uL — ABNORMAL LOW (ref 3.87–5.11)
WBC: 9.8 10*3/uL (ref 4.0–10.5)

## 2012-11-19 MED ORDER — HYDROCODONE-ACETAMINOPHEN 10-325 MG PO TABS: 2.0000 | ORAL_TABLET | ORAL | Status: DC | PRN

## 2012-11-19 MED ORDER — CYCLOBENZAPRINE HCL 10 MG PO TABS: 10.0000 mg | ORAL_TABLET | Freq: Three times a day (TID) | ORAL | Status: DC | PRN

## 2012-11-19 MED ORDER — RIVAROXABAN 10 MG PO TABS: 10.0000 mg | ORAL_TABLET | Freq: Every day | ORAL | Status: DC

## 2012-11-19 NOTE — Progress Notes (Signed)
Physical Therapy Treatment Patient Details Name: Colleen Glenn MRN: [id number] DOB: 05-10-1941 Today's Date: 11/19/2012 Time: 4098-1191 PT Time Calculation (min): 66 min  PT Assessment / Plan / Recommendation Comments on Treatment Session  Pt. ambulated much safer and with less difficulty maintaining R leg in neutral, Knee was not adducted and IR. pt to uuse shoe on L foot at home.    Follow Up Recommendations  Home health PT     Does the patient have the potential to tolerate intense rehabilitation     Barriers to Discharge        Equipment Recommendations  Rolling walker with 5" wheels    Recommendations for Other Services    Frequency     Plan Discharge plan remains appropriate;Frequency remains appropriate    Precautions / Restrictions Precautions Precautions: Posterior Hip Restrictions Weight Bearing Restrictions: No   Pertinent Vitals/Pain     Mobility  Bed Mobility Supine to Sit: 4: Min guard Sit to Supine: 4: Min guard Details for Bed Mobility Assistance: Pt. practiced x 2 with leglifter after instructng pt and family on its use. Pt continues to have tendency for R thigh to IR Transfers Sit to Stand: 4: Min guard;From bed Stand to Sit: To bed;4: Min guard Details for Transfer Assistance: cueas for R leg placed and position of thigh as it IR. cues for hand placement as pt almost buckled in standing up when did not have hand place correctly. Ambulation/Gait Ambulation/Gait Assistance: 5: Supervision Ambulation Distance (Feet): 80 Feet Assistive device: Rolling walker Ambulation/Gait Assistance Details: cues to attempt to ER Rle during swing.Pt. ambulated w shoe on non op foot and gait was much smoother and less nearly no add. and not IR. instructed pt to do the same at home. daughter present also Gait Pattern: Step-to pattern Stairs: Yes Stairs Assistance: 4: Min assist Stairs Assistance Details (indicate cue type and reason): instruction to spouse and  daughter , practiced x 3 Stair Management Technique: Backwards;With walker;No rails Number of Stairs: 3     Exercises Total Joint Exercises Quad Sets: AROM;Right;10 reps Short Arc Quad: AROM;Right;10 reps Heel Slides: AAROM;Right;10 reps (with sheet) Hip ABduction/ADduction: AROM;Right;10 reps (with sheet.) Other Exercises Other Exercises: active ER of Rthigh and hold x 10 secs.   PT Diagnosis:    PT Problem List:   PT Treatment Interventions:     PT Goals Acute Rehab PT Goals Pt will go Supine/Side to Sit: with supervision PT Goal: Supine/Side to Sit - Progress: Met Pt will go Sit to Supine/Side: with supervision PT Goal: Sit to Supine/Side - Progress: Met Pt will go Sit to Stand: with supervision PT Goal: Sit to Stand - Progress: Met  Visit Information  Last PT Received On: 11/19/12 Assistance Needed: +1    Subjective Data  Subjective: I don't know why my leg rolls in.   Cognition  Overall Cognitive Status: Appears within functional limits for tasks assessed/performed Arousal/Alertness: Awake/alert Orientation Level: Appears intact for tasks assessed Behavior During Session: [facility] for tasks performed    Balance     End of Session PT - End of Session Activity Tolerance: Patient tolerated treatment well Patient left: in bed;with call bell/phone within reach;with family/visitor present Nurse Communication: Mobility status   GP     Rada Hay 11/19/2012, 1:40 PM

## 2012-11-19 NOTE — Progress Notes (Signed)
Occupational Therapy Treatment Patient Details Name: Colleen Glenn MRN: [id number] DOB: 09-21-1941 Today's Date: 11/19/2012 Time: 5621-3086 OT Time Calculation (min): 35 min  OT Assessment / Plan / Recommendation Comments on Treatment Session Pt making slow progress towards goals. Recommend HHOT at d/c.    Follow Up Recommendations  Home health OT    Barriers to Discharge       Equipment Recommendations  3 in 1 bedside comode    Recommendations for Other Services    Frequency Min 2X/week   Plan Discharge plan remains appropriate    Precautions / Restrictions Precautions Precautions: Posterior Hip;Fall Restrictions Weight Bearing Restrictions: No   Pertinent Vitals/Pain Pt reported 5/10 pain in R hip. Repositioned for comfort.    ADL  Lower Body Dressing: Minimal assistance Where Assessed - Lower Body Dressing: Supported sit to stand Toilet Transfer: Hydrographic surveyor Method: Sit to Barista: Materials engineer and Hygiene: Min guard Where Assessed - Engineer, mining and Hygiene: Sit to stand from 3-in-1 or toilet Tub/Shower Transfer: Minimal assistance Tub/Shower Transfer Method: Science writer: Walk in shower Equipment Used: Rolling walker;Leg lifter;Reacher;Sock aid ADL Comments: Educated pt how to safely step into and out of shower with good return demo. Also educated pt how to complete LB ADLs with AE. More practice needed for proficiency.    OT Diagnosis:    OT Problem List:   OT Treatment Interventions:     OT Goals ADL Goals ADL Goal: Lower Body Dressing - Progress: Progressing toward goals ADL Goal: Toilet Transfer - Progress: Progressing toward goals ADL Goal: Toileting - Clothing Manipulation - Progress: Progressing toward goals ADL Goal: Toileting - Hygiene - Progress: Progressing toward goals ADL Goal: Tub/Shower Transfer - Progress: Progressing  toward goals  Visit Information  Last OT Received On: 11/19/12 Assistance Needed: +1    Subjective Data  Subjective: There's so much to remember.   Prior Functioning       Cognition  Overall Cognitive Status: Appears within functional limits for tasks assessed/performed Arousal/Alertness: Awake/alert Orientation Level: Appears intact for tasks assessed Behavior During Session: [facility] for tasks performed    Mobility  Shoulder Instructions Transfers Sit to Stand: 4: Min guard;4: Min assist;With upper extremity assist;From bed;From chair/3-in-1 Stand to Sit: 4: Min assist;4: Min guard;With upper extremity assist;To chair/3-in-1;To bed Details for Transfer Assistance: cues for hand placement and THP       Exercises      Balance     End of Session OT - End of Session Activity Tolerance: Patient tolerated treatment well Patient left: in chair;with call bell/phone within reach;with family/visitor present  GO     Colleen Glenn A 11/19/2012, 12:20 PM

## 2012-11-19 NOTE — Progress Notes (Signed)
   Subjective: 3 Days Post-Op Procedure(s) (LRB): TOTAL HIP ARTHROPLASTY (Right) Patient reports pain as mild.   Patient seen in rounds with Dr. Lequita Halt. Patient is well, and has had no acute complaints or problems Patient is ready to go home today.  Objective: Vital signs in last 24 hours: Temp:  [98.5 F (36.9 C)-99.4 F (37.4 C)] 98.5 F (36.9 C) (01/29 2133) Pulse Rate:  [101-111] 101  (01/29 2133) Resp:  [16] 16  (01/29 2133) BP: (132-150)/(66-72) 132/72 mmHg (01/29 2133) SpO2:  [90 %-98 %] 90 % (01/29 2133)  Intake/Output from previous day:  Intake/Output Summary (Last 24 hours) at 11/19/12 0749 Last data filed at 11/19/12 0200  Gross per 24 hour  Intake    340 ml  Output   1050 ml  Net   -710 ml     Labs:  Basename 11/19/12 0435 11/18/12 0439 11/17/12 0415  HGB 10.5* 11.0* 10.9*    Basename 11/19/12 0435 11/18/12 0439  WBC 9.8 12.4*  RBC 3.39* 3.64*  HCT 32.1* 33.6*  PLT 197 214    Basename 11/18/12 0439 11/17/12 0415  NA 131* 132*  K 4.1 3.9  CL 94* 100  CO2 28 25  BUN 10 10  CREATININE 0.63 0.59  GLUCOSE 123* 139*  CALCIUM 8.9 8.1*   No results found for this basename: LABPT:2,INR:2 in the last 72 hours  EXAM: General - Patient is Alert, Appropriate and Oriented Extremity - Neurovascular intact Sensation intact distally Dorsiflexion/Plantar flexion intact No cellulitis present Incision - clean, dry, no drainage, healing Motor Function - intact, moving foot and toes well on exam.   Assessment/Plan: 3 Days Post-Op Procedure(s) (LRB): TOTAL HIP ARTHROPLASTY (Right) Procedure(s) (LRB): TOTAL HIP ARTHROPLASTY (Right) Past Medical History  Diagnosis Date  . Depression   . Hyperlipidemia   . GERD (gastroesophageal reflux disease)   . Arthritis   . Plantar fasciitis     hx of   Principal Problem:  *OA (osteoarthritis) of hip Active Problems:  Postop Hyponatremia  Postop Acute blood loss anemia  Estimated Body mass index is 29.05  kg/(m^2) as calculated from the following:   Height as of this encounter: 5\' 6" (1.676 m).   Weight as of this encounter: 180 lb(81.647 kg). Up with therapy Discharge home with home health Diet - Cardiac diet Follow up - in 2 weeks Activity - WBAT Disposition - Home Condition Upon Discharge - Good D/C Meds - See DC Summary DVT Prophylaxis - Xarelto, ASA 81 mg on hold   PERKINS, ALEXZANDREW 11/19/2012, 7:49 AM

## 2012-11-19 NOTE — Discharge Summary (Signed)
Physician Discharge Summary   Patient ID: Colleen Glenn MRN: [id number] DOB/AGE: 02-17-1941 72 y.o.  Admit date: 11/16/2012 Discharge date: 11/19/2012  Primary Diagnosis: Osteoarthritis Right hip  Admission Diagnoses:  Past Medical History  Diagnosis Date  . Depression   . Hyperlipidemia   . GERD (gastroesophageal reflux disease)   . Arthritis   . Plantar fasciitis     hx of   Discharge Diagnoses:   Principal Problem:  *OA (osteoarthritis) of hip Active Problems:  Postop Hyponatremia  Postop Acute blood loss anemia  Estimated Body mass index is 29.05 kg/(m^2) as calculated from the following:   Height as of this encounter: 5\' 6" (1.676 m).   Weight as of this encounter: 180 lb(81.647 kg).  Classification of overweight in adults according to BMI (WHO, 1998)   Procedure: Procedure(s) (LRB): TOTAL HIP ARTHROPLASTY (Right)   Consults: None  HPI: Colleen Glenn is a 72 y.o. female with end stage arthritis of her right hip with progressively worsening pain and dysfunction. Pain occurs with activity and rest including pain at night. She has tried analgesics, protected weight bearing and rest without benefit. Pain is too severe to attempt physical therapy. Radiographs demonstrate bone on bone arthritis with subchondral cyst formation. She presents now for right THA.  Laboratory Data: Admission on 11/16/2012  Component Date Value Range Status  . ABO/RH(D) 11/16/2012 O NEG   Final  . Antibody Screen 11/16/2012 NEG   Final  . Sample Expiration 11/16/2012 11/19/2012   Final  . ABO/RH(D) 11/16/2012 O NEG   Final  . WBC 11/17/2012 7.9  4.0 - 10.5 K/uL Final  . RBC 11/17/2012 3.55* 3.87 - 5.11 MIL/uL Final  . Hemoglobin 11/17/2012 10.9* 12.0 - 15.0 g/dL Final  . HCT 78/29/5621 33.2* 36.0 - 46.0 % Final  . MCV 11/17/2012 93.5  78.0 - 100.0 fL Final  . MCH 11/17/2012 30.7  26.0 - 34.0 pg Final  . MCHC 11/17/2012 32.8  30.0 - 36.0 g/dL Final  . RDW 30/86/5784 13.7  11.5 - 15.5  % Final  . Platelets 11/17/2012 197  150 - 400 K/uL Final  . Sodium 11/17/2012 132* 135 - 145 mEq/L Final  . Potassium 11/17/2012 3.9  3.5 - 5.1 mEq/L Final  . Chloride 11/17/2012 100  96 - 112 mEq/L Final  . CO2 11/17/2012 25  19 - 32 mEq/L Final  . Glucose, Bld 11/17/2012 139* 70 - 99 mg/dL Final  . BUN 69/62/9528 10  6 - 23 mg/dL Final  . Creatinine, Ser 11/17/2012 0.59  0.50 - 1.10 mg/dL Final  . Calcium 41/32/4401 8.1* 8.4 - 10.5 mg/dL Final  . GFR calc non Af Amer 11/17/2012 >90  >90 mL/min Final  . GFR calc Af Amer 11/17/2012 >90  >90 mL/min Final   Comment:                                 The eGFR has been calculated                          using the CKD EPI equation.                          This calculation has not been  validated in all clinical                          situations.                          eGFR's persistently                          <90 mL/min signify                          possible Chronic Kidney Disease.  . WBC 11/18/2012 12.4* 4.0 - 10.5 K/uL Final  . RBC 11/18/2012 3.64* 3.87 - 5.11 MIL/uL Final  . Hemoglobin 11/18/2012 11.0* 12.0 - 15.0 g/dL Final  . HCT 16/07/9603 33.6* 36.0 - 46.0 % Final  . MCV 11/18/2012 92.3  78.0 - 100.0 fL Final  . MCH 11/18/2012 30.2  26.0 - 34.0 pg Final  . MCHC 11/18/2012 32.7  30.0 - 36.0 g/dL Final  . RDW 54/06/8118 13.7  11.5 - 15.5 % Final  . Platelets 11/18/2012 214  150 - 400 K/uL Final  . Sodium 11/18/2012 131* 135 - 145 mEq/L Final  . Potassium 11/18/2012 4.1  3.5 - 5.1 mEq/L Final  . Chloride 11/18/2012 94* 96 - 112 mEq/L Final  . CO2 11/18/2012 28  19 - 32 mEq/L Final  . Glucose, Bld 11/18/2012 123* 70 - 99 mg/dL Final  . BUN 14/78/2956 10  6 - 23 mg/dL Final  . Creatinine, Ser 11/18/2012 0.63  0.50 - 1.10 mg/dL Final  . Calcium 21/30/8657 8.9  8.4 - 10.5 mg/dL Final  . GFR calc non Af Amer 11/18/2012 89* >90 mL/min Final  . GFR calc Af Amer 11/18/2012 >90  >90 mL/min Final    Comment:                                 The eGFR has been calculated                          using the CKD EPI equation.                          This calculation has not been                          validated in all clinical                          situations.                          eGFR's persistently                          <90 mL/min signify                          possible Chronic Kidney Disease.  . WBC 11/19/2012 9.8  4.0 - 10.5 K/uL Final  . RBC 11/19/2012 3.39* 3.87 - 5.11 MIL/uL Final  . Hemoglobin 11/19/2012 10.5* 12.0 - 15.0 g/dL Final  . HCT 84/69/6295 32.1* 36.0 - 46.0 %  Final  . MCV 11/19/2012 94.7  78.0 - 100.0 fL Final  . MCH 11/19/2012 31.0  26.0 - 34.0 pg Final  . MCHC 11/19/2012 32.7  30.0 - 36.0 g/dL Final  . RDW 21/30/8657 14.2  11.5 - 15.5 % Final  . Platelets 11/19/2012 197  150 - 400 K/uL Final  Hospital Outpatient Visit on 11/06/2012  Component Date Value Range Status  . aPTT 11/06/2012 28  24 - 37 seconds Final  . WBC 11/06/2012 6.7  4.0 - 10.5 K/uL Final  . RBC 11/06/2012 4.56  3.87 - 5.11 MIL/uL Final  . Hemoglobin 11/06/2012 14.2  12.0 - 15.0 g/dL Final  . HCT 84/69/6295 43.2  36.0 - 46.0 % Final  . MCV 11/06/2012 94.7  78.0 - 100.0 fL Final  . MCH 11/06/2012 31.1  26.0 - 34.0 pg Final  . MCHC 11/06/2012 32.9  30.0 - 36.0 g/dL Final  . RDW 28/41/3244 13.9  11.5 - 15.5 % Final  . Platelets 11/06/2012 259  150 - 400 K/uL Final  . Sodium 11/06/2012 141  135 - 145 mEq/L Final  . Potassium 11/06/2012 3.9  3.5 - 5.1 mEq/L Final  . Chloride 11/06/2012 104  96 - 112 mEq/L Final  . CO2 11/06/2012 27  19 - 32 mEq/L Final  . Glucose, Bld 11/06/2012 96  70 - 99 mg/dL Final  . BUN 10/23/7251 14  6 - 23 mg/dL Final  . Creatinine, Ser 11/06/2012 0.79  0.50 - 1.10 mg/dL Final  . Calcium 66/44/0347 9.3  8.4 - 10.5 mg/dL Final  . Total Protein 11/06/2012 6.6  6.0 - 8.3 g/dL Final  . Albumin 42/59/5638 3.7  3.5 - 5.2 g/dL Final  . AST 75/64/3329 20  0 - 37  U/L Final  . ALT 11/06/2012 12  0 - 35 U/L Final  . Alkaline Phosphatase 11/06/2012 88  39 - 117 U/L Final  . Total Bilirubin 11/06/2012 0.2* 0.3 - 1.2 mg/dL Final  . GFR calc non Af Amer 11/06/2012 82* >90 mL/min Final  . GFR calc Af Amer 11/06/2012 >90  >90 mL/min Final   Comment:                                 The eGFR has been calculated                          using the CKD EPI equation.                          This calculation has not been                          validated in all clinical                          situations.                          eGFR's persistently                          <90 mL/min signify                          possible Chronic Kidney Disease.  Marland Kitchen  Prothrombin Time 11/06/2012 12.6  11.6 - 15.2 seconds Final  . INR 11/06/2012 0.95  0.00 - 1.49 Final  . Color, Urine 11/06/2012 YELLOW  YELLOW Final  . APPearance 11/06/2012 CLEAR  CLEAR Final  . Specific Gravity, Urine 11/06/2012 1.011  1.005 - 1.030 Final  . pH 11/06/2012 7.5  5.0 - 8.0 Final  . Glucose, UA 11/06/2012 NEGATIVE  NEGATIVE mg/dL Final  . Hgb urine dipstick 11/06/2012 NEGATIVE  NEGATIVE Final  . Bilirubin Urine 11/06/2012 NEGATIVE  NEGATIVE Final  . Ketones, ur 11/06/2012 NEGATIVE  NEGATIVE mg/dL Final  . Protein, ur 40/98/1191 NEGATIVE  NEGATIVE mg/dL Final  . Urobilinogen, UA 11/06/2012 0.2  0.0 - 1.0 mg/dL Final  . Nitrite 47/82/9562 NEGATIVE  NEGATIVE Final  . Leukocytes, UA 11/06/2012 TRACE* NEGATIVE Final  . MRSA, PCR 11/06/2012 NEGATIVE  NEGATIVE Final  . Staphylococcus aureus 11/06/2012 NEGATIVE  NEGATIVE Final   Comment:                                 The Xpert SA Assay (FDA                          approved for NASAL specimens                          in patients over 67 years of age),                          is one component of                          a comprehensive surveillance                          program.  Test performance has                          been  validated by [employer] for patients greater                          than or equal to 69 year old.                          It is not intended                          to diagnose infection nor to                          guide or monitor treatment.  . Squamous Epithelial / LPF 11/06/2012 RARE  RARE Final  . WBC, UA 11/06/2012 0-2  <3 WBC/hpf Final  . Bacteria, UA 11/06/2012 RARE  RARE Final     X-Rays:Dg Hip Complete Right  11/06/2012  *RADIOLOGY REPORT*  Clinical Data: Preoperative assessment for right total hip replacement  RIGHT HIP - COMPLETE 2+ VIEW  Comparison: None  Findings: Osseous demineralization. Osteoarthritic changes bilateral hip joints with joint space narrowing greater on right.  Minimal subchondral sclerosis and lucency at right femoral head. No acute fracture, dislocation, or bone destruction. Multiple pelvic phleboliths.  IMPRESSION: Osteoarthritic changes of bilateral hip joints greater on the right.   Original Report Authenticated By: Ulyses Southward, M.D.    Dg Pelvis Portable  11/16/2012  *RADIOLOGY REPORT*  Clinical Data: Post right.  PORTABLE PELVIS,PORTABLE RIGHT HIP - 1 VIEW  Comparison: 11/06/2012.  Findings: Total right hip replacement appears in satisfactory position on this single projection.  Surgical drain is in place.  Mild sclerosis left femoral head with mild joint space narrowing.  IMPRESSION: Total right knee replacement appears in satisfactory position.   Original Report Authenticated By: Lacy Duverney, M.D.    Dg Hip Portable 1 View Right  11/16/2012  *RADIOLOGY REPORT*  Clinical Data: Post right.  PORTABLE PELVIS,PORTABLE RIGHT HIP - 1 VIEW  Comparison: 11/06/2012.  Findings: Total right hip replacement appears in satisfactory position on this single projection.  Surgical drain is in place.  Mild sclerosis left femoral head with mild joint space narrowing.  IMPRESSION: Total right knee replacement appears in satisfactory position.    Original Report Authenticated By: Lacy Duverney, M.D.     EKG: Orders placed during the hospital encounter of 05/07/12  . EKG     Hospital Course: Patient was admitted to [facility] and taken to the OR and underwent the above state procedure without complications.  Patient tolerated the procedure well and was later transferred to the recovery room and then to the orthopaedic floor for postoperative care.  They were given PO and IV analgesics for pain control following their surgery.  They were given 24 hours of postoperative antibiotics of  Anti-infectives     Start     Dose/Rate Route Frequency Ordered Stop   11/16/12 1830   ceFAZolin (ANCEF) IVPB 2 g/50 mL premix        2 g 100 mL/hr over 30 Minutes Intravenous Every 6 hours 11/16/12 1527 11/17/12 0113   11/16/12 1023   ceFAZolin (ANCEF) IVPB 2 g/50 mL premix        2 g 100 mL/hr over 30 Minutes Intravenous 60 min pre-op 11/16/12 1023 11/16/12 1224         and started on DVT prophylaxis in the form of Xarelto.   PT and OT were ordered for total hip protocol.  The patient was allowed to be WBAT with therapy. Discharge planning was consulted to help with postop disposition and equipment needs.  Patient had a tough night on the evening of surgery due to pain but started to get up OOB with therapy on day one but was only able to walk about 5 feet.  Hemovac drain was pulled without difficulty.  The knee immobilizer was removed and discontinued.  Continued to work with therapy into day two walking about 50 feet and then again 80 feet.  Dressing was changed on day two and the incision was healing well.  By day three, the patient had progressed with therapy and meeting their goals.  Incision was healing well.  Patient was seen in rounds and was ready to go home.   Discharge Medications: Prior to Admission medications   Medication Sig Start Date End Date Taking? Authorizing Provider  buPROPion (WELLBUTRIN XL) 300 MG 24 hr tablet Take  300 mg by mouth daily before breakfast.    Yes Historical Provider, MD  omeprazole (PRILOSEC) 20 MG capsule Take 20 mg by mouth 2 (two) times daily.   Yes Historical Provider, MD  polycarbophil (FIBERCON) 625 MG tablet Take 2,500 mg by mouth at bedtime.    Yes Historical Provider, MD  simvastatin (ZOCOR) 40 MG tablet Take 20 mg by mouth at bedtime.   Yes Historical Provider, MD  triamterene-hydrochlorothiazide (MAXZIDE-25) 37.5-25 MG per tablet Take 1 tablet by mouth daily before breakfast.    Yes Historical Provider, MD  cyclobenzaprine (FLEXERIL) 10 MG tablet Take 1 tablet (10 mg total) by mouth 3 (three) times daily as needed for muscle spasms. 11/19/12   Alexzandrew Perkins, PA  docusate sodium (STOOL SOFTENER) 100 MG capsule Take 200 mg by mouth every other day.    Historical Provider, MD  HYDROcodone-acetaminophen (NORCO) 10-325 MG per tablet Take 2 tablets by mouth every 4 (four) hours as needed. 11/19/12   Alexzandrew Julien Girt, PA  rivaroxaban (XARELTO) 10 MG TABS tablet Take 1 tablet (10 mg total) by mouth daily with breakfast. Take Xarelto for two and a half more weeks, then discontinue Xarelto. Once the patient has completed the Xarelto, they may resume the 81 mg Aspirin. 11/19/12   Alexzandrew Julien Girt, PA    Diet: Cardiac diet Activity:WBAT No bending hip over 90 degrees- A "L" Angle Do not cross legs Do not let foot roll inward When turning these patients a pillow should be placed between the patient's legs to prevent crossing. Patients should have the affected knee fully extended when trying to sit or stand from all surfaces to prevent excessive hip flexion. When ambulating and turning toward the affected side the affected leg should have the toes turned out prior to moving the walker and the rest of patient's body as to prevent internal rotation/ turning in of the leg. Abduction pillows are the most effective way to prevent a patient from not crossing legs or turning toes in at rest.  If an abduction pillow is not ordered placing a regular pillow length wise between the patient's legs is also an effective reminder. It is imperative that these precautions be maintained so that the surgical hip does not dislocate. Follow-up:in 2 weeks Disposition - Home Discharged Condition: good   Discharge Orders    Future Orders Please Complete By Expires   Diet - low sodium heart healthy      Call MD / Call 911      Comments:   If you experience chest pain or shortness of breath, CALL 911 and be transported to the hospital emergency room.  If you develope a fever above 101 F, pus (white drainage) or increased drainage or redness at the wound, or calf pain, call your surgeon's office.   Discharge instructions      Comments:   Pick up stool softner and laxative for home. Do not submerge incision under water. May shower. Continue to use ice for pain and swelling from surgery. Hip precautions.  Total Hip Protocol.  Take Xarelto for two and a half more weeks, then discontinue Xarelto. Once the patient has completed the Xarelto, they may resume the 81 mg Aspirin.   Constipation Prevention      Comments:   Drink plenty of fluids.  Prune juice may be helpful.  You may use a stool softener, such as Colace (over the counter) 100 mg twice a day.  Use MiraLax (over the counter) for constipation as needed.   Increase activity slowly as tolerated      Patient may shower      Comments:   You may shower without a dressing once there is no drainage.  Do not wash  over the wound.  If drainage remains, do not shower until drainage stops.   Weight bearing as tolerated      Driving restrictions      Comments:   No driving until released by the physician.   Lifting restrictions      Comments:   No lifting until released by the physician.   Follow the hip precautions as taught in Physical Therapy      Change dressing      Comments:   You may change your dressing dressing daily with sterile 4 x  4 inch gauze dressing and paper tape.  Do not submerge the incision under water.   TED hose      Comments:   Use stockings (TED hose) for 3 weeks on both leg(s).  You may remove them at night for sleeping.   Do not sit on low chairs, stoools or toilet seats, as it may be difficult to get up from low surfaces          Medication List     As of 11/19/2012  7:54 AM    STOP taking these medications         aspirin EC 81 MG tablet      Biotin 5000 MCG Tabs      raloxifene 60 MG tablet   Commonly known as: EVISTA      Vitamin D (Ergocalciferol) 50000 UNITS Caps   Commonly known as: DRISDOL      TAKE these medications         buPROPion 300 MG 24 hr tablet   Commonly known as: WELLBUTRIN XL   Take 300 mg by mouth daily before breakfast.      cyclobenzaprine 10 MG tablet   Commonly known as: FLEXERIL   Take 1 tablet (10 mg total) by mouth 3 (three) times daily as needed for muscle spasms.      HYDROcodone-acetaminophen 10-325 MG per tablet   Commonly known as: NORCO   Take 2 tablets by mouth every 4 (four) hours as needed.      omeprazole 20 MG capsule   Commonly known as: PRILOSEC   Take 20 mg by mouth 2 (two) times daily.      polycarbophil 625 MG tablet   Commonly known as: FIBERCON   Take 2,500 mg by mouth at bedtime.      rivaroxaban 10 MG Tabs tablet   Commonly known as: XARELTO   Take 1 tablet (10 mg total) by mouth daily with breakfast. Take Xarelto for two and a half more weeks, then discontinue Xarelto.  Once the patient has completed the Xarelto, they may resume the 81 mg Aspirin.      simvastatin 40 MG tablet   Commonly known as: ZOCOR   Take 20 mg by mouth at bedtime.      STOOL SOFTENER 100 MG capsule   Generic drug: docusate sodium   Take 200 mg by mouth every other day.      triamterene-hydrochlorothiazide 37.5-25 MG per tablet   Commonly known as: MAXZIDE-25   Take 1 tablet by mouth daily before breakfast.           Follow-up Information     Follow up with Loanne Drilling, MD. Schedule an appointment as soon as possible for a visit in 2 weeks.   Contact information:   [address], [address] [place] [address] [phone]          Signed: Patrica Duel  11/19/2012, 7:54 AM

## 2012-11-20 DIAGNOSIS — F329 Major depressive disorder, single episode, unspecified: Secondary | ICD-10-CM | POA: Diagnosis not present

## 2012-11-20 DIAGNOSIS — Z96649 Presence of unspecified artificial hip joint: Secondary | ICD-10-CM | POA: Diagnosis not present

## 2012-11-20 DIAGNOSIS — I10 Essential (primary) hypertension: Secondary | ICD-10-CM | POA: Diagnosis not present

## 2012-11-20 DIAGNOSIS — IMO0001 Reserved for inherently not codable concepts without codable children: Secondary | ICD-10-CM | POA: Diagnosis not present

## 2012-11-20 DIAGNOSIS — Z471 Aftercare following joint replacement surgery: Secondary | ICD-10-CM | POA: Diagnosis not present

## 2012-11-21 DIAGNOSIS — F410 Panic disorder [episodic paroxysmal anxiety] without agoraphobia: Secondary | ICD-10-CM

## 2012-11-21 HISTORY — DX: Panic disorder (episodic paroxysmal anxiety): F41.0

## 2012-11-23 DIAGNOSIS — F329 Major depressive disorder, single episode, unspecified: Secondary | ICD-10-CM | POA: Diagnosis not present

## 2012-11-23 DIAGNOSIS — I10 Essential (primary) hypertension: Secondary | ICD-10-CM | POA: Diagnosis not present

## 2012-11-23 DIAGNOSIS — Z471 Aftercare following joint replacement surgery: Secondary | ICD-10-CM | POA: Diagnosis not present

## 2012-11-23 DIAGNOSIS — IMO0001 Reserved for inherently not codable concepts without codable children: Secondary | ICD-10-CM | POA: Diagnosis not present

## 2012-11-23 DIAGNOSIS — Z96649 Presence of unspecified artificial hip joint: Secondary | ICD-10-CM | POA: Diagnosis not present

## 2012-11-24 DIAGNOSIS — IMO0001 Reserved for inherently not codable concepts without codable children: Secondary | ICD-10-CM | POA: Diagnosis not present

## 2012-11-24 DIAGNOSIS — Z96649 Presence of unspecified artificial hip joint: Secondary | ICD-10-CM | POA: Diagnosis not present

## 2012-11-24 DIAGNOSIS — I10 Essential (primary) hypertension: Secondary | ICD-10-CM | POA: Diagnosis not present

## 2012-11-24 DIAGNOSIS — Z471 Aftercare following joint replacement surgery: Secondary | ICD-10-CM | POA: Diagnosis not present

## 2012-11-24 DIAGNOSIS — F329 Major depressive disorder, single episode, unspecified: Secondary | ICD-10-CM | POA: Diagnosis not present

## 2012-11-25 DIAGNOSIS — IMO0001 Reserved for inherently not codable concepts without codable children: Secondary | ICD-10-CM | POA: Diagnosis not present

## 2012-11-25 DIAGNOSIS — I10 Essential (primary) hypertension: Secondary | ICD-10-CM | POA: Diagnosis not present

## 2012-11-25 DIAGNOSIS — Z471 Aftercare following joint replacement surgery: Secondary | ICD-10-CM | POA: Diagnosis not present

## 2012-11-25 DIAGNOSIS — F329 Major depressive disorder, single episode, unspecified: Secondary | ICD-10-CM | POA: Diagnosis not present

## 2012-11-25 DIAGNOSIS — Z96649 Presence of unspecified artificial hip joint: Secondary | ICD-10-CM | POA: Diagnosis not present

## 2012-11-26 DIAGNOSIS — IMO0001 Reserved for inherently not codable concepts without codable children: Secondary | ICD-10-CM | POA: Diagnosis not present

## 2012-11-26 DIAGNOSIS — I10 Essential (primary) hypertension: Secondary | ICD-10-CM | POA: Diagnosis not present

## 2012-11-26 DIAGNOSIS — Z471 Aftercare following joint replacement surgery: Secondary | ICD-10-CM | POA: Diagnosis not present

## 2012-11-26 DIAGNOSIS — F329 Major depressive disorder, single episode, unspecified: Secondary | ICD-10-CM | POA: Diagnosis not present

## 2012-11-26 DIAGNOSIS — Z96649 Presence of unspecified artificial hip joint: Secondary | ICD-10-CM | POA: Diagnosis not present

## 2012-11-30 DIAGNOSIS — IMO0001 Reserved for inherently not codable concepts without codable children: Secondary | ICD-10-CM | POA: Diagnosis not present

## 2012-11-30 DIAGNOSIS — Z96649 Presence of unspecified artificial hip joint: Secondary | ICD-10-CM | POA: Diagnosis not present

## 2012-11-30 DIAGNOSIS — F329 Major depressive disorder, single episode, unspecified: Secondary | ICD-10-CM | POA: Diagnosis not present

## 2012-11-30 DIAGNOSIS — Z471 Aftercare following joint replacement surgery: Secondary | ICD-10-CM | POA: Diagnosis not present

## 2012-11-30 DIAGNOSIS — I10 Essential (primary) hypertension: Secondary | ICD-10-CM | POA: Diagnosis not present

## 2012-12-02 DIAGNOSIS — F329 Major depressive disorder, single episode, unspecified: Secondary | ICD-10-CM | POA: Diagnosis not present

## 2012-12-02 DIAGNOSIS — Z471 Aftercare following joint replacement surgery: Secondary | ICD-10-CM | POA: Diagnosis not present

## 2012-12-02 DIAGNOSIS — IMO0001 Reserved for inherently not codable concepts without codable children: Secondary | ICD-10-CM | POA: Diagnosis not present

## 2012-12-02 DIAGNOSIS — Z96649 Presence of unspecified artificial hip joint: Secondary | ICD-10-CM | POA: Diagnosis not present

## 2012-12-02 DIAGNOSIS — I10 Essential (primary) hypertension: Secondary | ICD-10-CM | POA: Diagnosis not present

## 2012-12-11 DIAGNOSIS — M169 Osteoarthritis of hip, unspecified: Secondary | ICD-10-CM | POA: Diagnosis not present

## 2012-12-14 DIAGNOSIS — F411 Generalized anxiety disorder: Secondary | ICD-10-CM | POA: Diagnosis not present

## 2012-12-14 DIAGNOSIS — I10 Essential (primary) hypertension: Secondary | ICD-10-CM | POA: Diagnosis not present

## 2012-12-14 DIAGNOSIS — M1990 Unspecified osteoarthritis, unspecified site: Secondary | ICD-10-CM | POA: Diagnosis not present

## 2012-12-14 DIAGNOSIS — E538 Deficiency of other specified B group vitamins: Secondary | ICD-10-CM | POA: Diagnosis not present

## 2012-12-16 DIAGNOSIS — E538 Deficiency of other specified B group vitamins: Secondary | ICD-10-CM | POA: Diagnosis not present

## 2012-12-16 DIAGNOSIS — M169 Osteoarthritis of hip, unspecified: Secondary | ICD-10-CM | POA: Diagnosis not present

## 2012-12-18 DIAGNOSIS — M169 Osteoarthritis of hip, unspecified: Secondary | ICD-10-CM | POA: Diagnosis not present

## 2012-12-23 DIAGNOSIS — M169 Osteoarthritis of hip, unspecified: Secondary | ICD-10-CM | POA: Diagnosis not present

## 2012-12-28 DIAGNOSIS — Z96649 Presence of unspecified artificial hip joint: Secondary | ICD-10-CM | POA: Diagnosis not present

## 2012-12-30 DIAGNOSIS — M169 Osteoarthritis of hip, unspecified: Secondary | ICD-10-CM | POA: Diagnosis not present

## 2013-01-01 DIAGNOSIS — M169 Osteoarthritis of hip, unspecified: Secondary | ICD-10-CM | POA: Diagnosis not present

## 2013-01-05 DIAGNOSIS — M169 Osteoarthritis of hip, unspecified: Secondary | ICD-10-CM | POA: Diagnosis not present

## 2013-01-08 DIAGNOSIS — M169 Osteoarthritis of hip, unspecified: Secondary | ICD-10-CM | POA: Diagnosis not present

## 2013-01-13 DIAGNOSIS — E785 Hyperlipidemia, unspecified: Secondary | ICD-10-CM | POA: Diagnosis not present

## 2013-01-13 DIAGNOSIS — R7301 Impaired fasting glucose: Secondary | ICD-10-CM | POA: Diagnosis not present

## 2013-01-13 DIAGNOSIS — E538 Deficiency of other specified B group vitamins: Secondary | ICD-10-CM | POA: Diagnosis not present

## 2013-01-13 DIAGNOSIS — M949 Disorder of cartilage, unspecified: Secondary | ICD-10-CM | POA: Diagnosis not present

## 2013-01-13 DIAGNOSIS — M899 Disorder of bone, unspecified: Secondary | ICD-10-CM | POA: Diagnosis not present

## 2013-01-13 DIAGNOSIS — I10 Essential (primary) hypertension: Secondary | ICD-10-CM | POA: Diagnosis not present

## 2013-01-18 DIAGNOSIS — D126 Benign neoplasm of colon, unspecified: Secondary | ICD-10-CM | POA: Diagnosis not present

## 2013-01-18 DIAGNOSIS — R7301 Impaired fasting glucose: Secondary | ICD-10-CM | POA: Diagnosis not present

## 2013-01-18 DIAGNOSIS — Z0000 Encounter for general adult medical examination without abnormal findings: Secondary | ICD-10-CM | POA: Diagnosis not present

## 2013-01-18 DIAGNOSIS — N281 Cyst of kidney, acquired: Secondary | ICD-10-CM | POA: Diagnosis not present

## 2013-01-18 DIAGNOSIS — M5137 Other intervertebral disc degeneration, lumbosacral region: Secondary | ICD-10-CM | POA: Diagnosis not present

## 2013-01-18 DIAGNOSIS — E538 Deficiency of other specified B group vitamins: Secondary | ICD-10-CM | POA: Diagnosis not present

## 2013-01-18 DIAGNOSIS — I10 Essential (primary) hypertension: Secondary | ICD-10-CM | POA: Diagnosis not present

## 2013-01-18 DIAGNOSIS — F411 Generalized anxiety disorder: Secondary | ICD-10-CM | POA: Diagnosis not present

## 2013-01-18 DIAGNOSIS — Z124 Encounter for screening for malignant neoplasm of cervix: Secondary | ICD-10-CM | POA: Diagnosis not present

## 2013-01-20 DIAGNOSIS — Z1212 Encounter for screening for malignant neoplasm of rectum: Secondary | ICD-10-CM | POA: Diagnosis not present

## 2013-02-03 DIAGNOSIS — M949 Disorder of cartilage, unspecified: Secondary | ICD-10-CM | POA: Diagnosis not present

## 2013-02-03 DIAGNOSIS — M899 Disorder of bone, unspecified: Secondary | ICD-10-CM | POA: Diagnosis not present

## 2013-08-02 DIAGNOSIS — Z1331 Encounter for screening for depression: Secondary | ICD-10-CM | POA: Diagnosis not present

## 2013-08-02 DIAGNOSIS — E785 Hyperlipidemia, unspecified: Secondary | ICD-10-CM | POA: Diagnosis not present

## 2013-08-02 DIAGNOSIS — M899 Disorder of bone, unspecified: Secondary | ICD-10-CM | POA: Diagnosis not present

## 2013-08-02 DIAGNOSIS — I10 Essential (primary) hypertension: Secondary | ICD-10-CM | POA: Diagnosis not present

## 2013-08-02 DIAGNOSIS — E538 Deficiency of other specified B group vitamins: Secondary | ICD-10-CM | POA: Diagnosis not present

## 2013-08-02 DIAGNOSIS — R7301 Impaired fasting glucose: Secondary | ICD-10-CM | POA: Diagnosis not present

## 2013-08-02 DIAGNOSIS — Z6829 Body mass index (BMI) 29.0-29.9, adult: Secondary | ICD-10-CM | POA: Diagnosis not present

## 2013-08-20 DIAGNOSIS — Z23 Encounter for immunization: Secondary | ICD-10-CM | POA: Diagnosis not present

## 2013-08-21 DIAGNOSIS — Z23 Encounter for immunization: Secondary | ICD-10-CM | POA: Diagnosis not present

## 2013-12-14 ENCOUNTER — Other Ambulatory Visit: Payer: Self-pay | Admitting: Internal Medicine

## 2013-12-14 DIAGNOSIS — Z1231 Encounter for screening mammogram for malignant neoplasm of breast: Secondary | ICD-10-CM

## 2013-12-24 ENCOUNTER — Ambulatory Visit: Payer: Medicare Other

## 2014-01-14 ENCOUNTER — Ambulatory Visit
Admission: RE | Admit: 2014-01-14 | Discharge: 2014-01-14 | Disposition: A | Discharge status: Home / Self Care | Payer: Medicare Other | Source: Ambulatory Visit | Attending: Internal Medicine | Admitting: Internal Medicine

## 2014-01-14 DIAGNOSIS — Z1231 Encounter for screening mammogram for malignant neoplasm of breast: Secondary | ICD-10-CM | POA: Diagnosis not present

## 2014-01-20 DIAGNOSIS — E785 Hyperlipidemia, unspecified: Secondary | ICD-10-CM | POA: Diagnosis not present

## 2014-01-20 DIAGNOSIS — R82998 Other abnormal findings in urine: Secondary | ICD-10-CM | POA: Diagnosis not present

## 2014-01-20 DIAGNOSIS — R7301 Impaired fasting glucose: Secondary | ICD-10-CM | POA: Diagnosis not present

## 2014-01-20 DIAGNOSIS — E538 Deficiency of other specified B group vitamins: Secondary | ICD-10-CM | POA: Diagnosis not present

## 2014-01-20 DIAGNOSIS — M899 Disorder of bone, unspecified: Secondary | ICD-10-CM | POA: Diagnosis not present

## 2014-01-20 DIAGNOSIS — M949 Disorder of cartilage, unspecified: Secondary | ICD-10-CM | POA: Diagnosis not present

## 2014-01-20 DIAGNOSIS — I10 Essential (primary) hypertension: Secondary | ICD-10-CM | POA: Diagnosis not present

## 2014-01-31 DIAGNOSIS — I10 Essential (primary) hypertension: Secondary | ICD-10-CM | POA: Diagnosis not present

## 2014-01-31 DIAGNOSIS — Z0000 Encounter for general adult medical examination without abnormal findings: Secondary | ICD-10-CM | POA: Diagnosis not present

## 2014-01-31 DIAGNOSIS — E538 Deficiency of other specified B group vitamins: Secondary | ICD-10-CM | POA: Diagnosis not present

## 2014-01-31 DIAGNOSIS — F329 Major depressive disorder, single episode, unspecified: Secondary | ICD-10-CM | POA: Diagnosis not present

## 2014-01-31 DIAGNOSIS — Z1331 Encounter for screening for depression: Secondary | ICD-10-CM | POA: Diagnosis not present

## 2014-01-31 DIAGNOSIS — K5730 Diverticulosis of large intestine without perforation or abscess without bleeding: Secondary | ICD-10-CM | POA: Diagnosis not present

## 2014-01-31 DIAGNOSIS — M5137 Other intervertebral disc degeneration, lumbosacral region: Secondary | ICD-10-CM | POA: Diagnosis not present

## 2014-01-31 DIAGNOSIS — R7301 Impaired fasting glucose: Secondary | ICD-10-CM | POA: Diagnosis not present

## 2014-01-31 DIAGNOSIS — F3289 Other specified depressive episodes: Secondary | ICD-10-CM | POA: Diagnosis not present

## 2014-02-02 DIAGNOSIS — Z1212 Encounter for screening for malignant neoplasm of rectum: Secondary | ICD-10-CM | POA: Diagnosis not present

## 2014-08-03 DIAGNOSIS — Z23 Encounter for immunization: Secondary | ICD-10-CM | POA: Diagnosis not present

## 2014-08-17 DIAGNOSIS — W540XXA Bitten by dog, initial encounter: Secondary | ICD-10-CM | POA: Diagnosis not present

## 2014-08-17 DIAGNOSIS — S61431A Puncture wound without foreign body of right hand, initial encounter: Secondary | ICD-10-CM | POA: Diagnosis not present

## 2014-08-17 DIAGNOSIS — S61210A Laceration without foreign body of right index finger without damage to nail, initial encounter: Secondary | ICD-10-CM | POA: Diagnosis not present

## 2014-08-23 DIAGNOSIS — S61451D Open bite of right hand, subsequent encounter: Secondary | ICD-10-CM | POA: Diagnosis not present

## 2014-10-21 HISTORY — PX: POLYPECTOMY: SHX149

## 2014-10-21 HISTORY — PX: COLONOSCOPY: SHX174

## 2014-12-12 ENCOUNTER — Encounter: Payer: Self-pay | Admitting: Internal Medicine

## 2015-02-08 DIAGNOSIS — R8299 Other abnormal findings in urine: Secondary | ICD-10-CM | POA: Diagnosis not present

## 2015-02-08 DIAGNOSIS — M859 Disorder of bone density and structure, unspecified: Secondary | ICD-10-CM | POA: Diagnosis not present

## 2015-02-08 DIAGNOSIS — I10 Essential (primary) hypertension: Secondary | ICD-10-CM | POA: Diagnosis not present

## 2015-02-08 DIAGNOSIS — R7301 Impaired fasting glucose: Secondary | ICD-10-CM | POA: Diagnosis not present

## 2015-02-08 DIAGNOSIS — E785 Hyperlipidemia, unspecified: Secondary | ICD-10-CM | POA: Diagnosis not present

## 2015-02-08 DIAGNOSIS — N390 Urinary tract infection, site not specified: Secondary | ICD-10-CM | POA: Diagnosis not present

## 2015-02-08 DIAGNOSIS — E538 Deficiency of other specified B group vitamins: Secondary | ICD-10-CM | POA: Diagnosis not present

## 2015-02-15 DIAGNOSIS — F418 Other specified anxiety disorders: Secondary | ICD-10-CM | POA: Diagnosis not present

## 2015-02-15 DIAGNOSIS — Z1389 Encounter for screening for other disorder: Secondary | ICD-10-CM | POA: Diagnosis not present

## 2015-02-15 DIAGNOSIS — Z0000 Encounter for general adult medical examination without abnormal findings: Secondary | ICD-10-CM | POA: Diagnosis not present

## 2015-02-15 DIAGNOSIS — R7301 Impaired fasting glucose: Secondary | ICD-10-CM | POA: Diagnosis not present

## 2015-02-15 DIAGNOSIS — I10 Essential (primary) hypertension: Secondary | ICD-10-CM | POA: Diagnosis not present

## 2015-02-15 DIAGNOSIS — N281 Cyst of kidney, acquired: Secondary | ICD-10-CM | POA: Diagnosis not present

## 2015-02-15 DIAGNOSIS — Z1231 Encounter for screening mammogram for malignant neoplasm of breast: Secondary | ICD-10-CM | POA: Diagnosis not present

## 2015-02-15 DIAGNOSIS — Z6830 Body mass index (BMI) 30.0-30.9, adult: Secondary | ICD-10-CM | POA: Diagnosis not present

## 2015-02-15 DIAGNOSIS — M5430 Sciatica, unspecified side: Secondary | ICD-10-CM | POA: Diagnosis not present

## 2015-02-15 DIAGNOSIS — Z1212 Encounter for screening for malignant neoplasm of rectum: Secondary | ICD-10-CM | POA: Diagnosis not present

## 2015-02-15 DIAGNOSIS — M5136 Other intervertebral disc degeneration, lumbar region: Secondary | ICD-10-CM | POA: Diagnosis not present

## 2015-02-15 DIAGNOSIS — E538 Deficiency of other specified B group vitamins: Secondary | ICD-10-CM | POA: Diagnosis not present

## 2015-02-15 DIAGNOSIS — F329 Major depressive disorder, single episode, unspecified: Secondary | ICD-10-CM | POA: Diagnosis not present

## 2015-02-16 ENCOUNTER — Other Ambulatory Visit: Payer: Self-pay

## 2015-02-16 ENCOUNTER — Encounter: Payer: Self-pay | Admitting: Internal Medicine

## 2015-02-16 DIAGNOSIS — Z1231 Encounter for screening mammogram for malignant neoplasm of breast: Secondary | ICD-10-CM

## 2015-03-02 ENCOUNTER — Telehealth: Payer: Self-pay | Admitting: *Deleted

## 2015-03-02 ENCOUNTER — Ambulatory Visit (AMBULATORY_SURGERY_CENTER): Payer: Self-pay | Admitting: *Deleted

## 2015-03-02 VITALS — Ht 65.0 in | Wt 180.8 lb

## 2015-03-02 DIAGNOSIS — Z86010 Personal history of colonic polyps: Secondary | ICD-10-CM

## 2015-03-02 NOTE — Telephone Encounter (Signed)
Pt is concerned about her reflux. She has episodes where she wakes at night with severe burning in her throat. This has been happening 3 times a week or more for a long time.  She was on omeprazole 20 mg twice a day but recently has been out of her medicine, plans to restart but this regimen did not help her symptoms.  Dr Joylene Draft recommended her asking about having an egd with her colon . She has a colon 5-25 Wednesday 330 pm for hx colon polyps.  Do you want her to have an OV or add this to her colon and reschedule her date?  Please advise   [name]

## 2015-03-02 NOTE — Telephone Encounter (Signed)
Neither. She can discuss her reflux with me when she comes for her colonoscopy. Remind her to do that. I can decide at that time whether she needs upper endoscopy, which would be scheduled at another time.

## 2015-03-02 NOTE — Telephone Encounter (Signed)
Pt notified of Dr Blanch Media instructions. Verbalized understanding  [name]

## 2015-03-02 NOTE — Progress Notes (Signed)
No egg or soy allergy No home 02 use No diet pills No issues with past sedation Pt having severe reflux issues wants egd with her colon. TE to [facility]ry, OV vs double?

## 2015-03-15 ENCOUNTER — Ambulatory Visit (AMBULATORY_SURGERY_CENTER): Payer: Medicare Other | Admitting: Internal Medicine

## 2015-03-15 ENCOUNTER — Encounter: Payer: Self-pay | Admitting: Internal Medicine

## 2015-03-15 VITALS — BP 147/71 | HR 94 | Temp 98.1°F | Resp 27 | Ht 65.0 in | Wt 180.0 lb

## 2015-03-15 DIAGNOSIS — Z86010 Personal history of colonic polyps: Secondary | ICD-10-CM

## 2015-03-15 DIAGNOSIS — F329 Major depressive disorder, single episode, unspecified: Secondary | ICD-10-CM | POA: Diagnosis not present

## 2015-03-15 DIAGNOSIS — D124 Benign neoplasm of descending colon: Secondary | ICD-10-CM | POA: Diagnosis not present

## 2015-03-15 DIAGNOSIS — K219 Gastro-esophageal reflux disease without esophagitis: Secondary | ICD-10-CM | POA: Diagnosis not present

## 2015-03-15 MED ORDER — OMEPRAZOLE 40 MG PO CPDR: 40.0000 mg | DELAYED_RELEASE_CAPSULE | Freq: Every day | ORAL | Status: DC

## 2015-03-15 MED ORDER — SODIUM CHLORIDE 0.9 % IV SOLN: 500.0000 mL | INTRAVENOUS | Status: DC

## 2015-03-15 NOTE — Progress Notes (Signed)
Called to room to assist during endoscopic procedure.  Patient ID and intended procedure confirmed with present staff. Received instructions for my participation in the procedure from the performing physician.  

## 2015-03-15 NOTE — Progress Notes (Signed)
Transferred to recovery room. A/O x3, pleased with MAC.  VSS.  Report to Penny, RN. 

## 2015-03-15 NOTE — Progress Notes (Signed)
Patient requesting to have ECL today. Prior documentation noted with Dr. Henrene Pastor and Lelan Pons. Discussed with Dr. Henrene Pastor and he states that he is willing to discuss and complete procedure if patient will consent for it and she is made aware that no prior authorization has been obtained and bill may be her responsibility. Patient made aware. Discussed with Berneta Sages, who looked at her insurance and stated that prior authorization would not have been requested as patient has double coverage and would likely be covered, charges would go against her benefits.

## 2015-03-15 NOTE — Op Note (Signed)
[facility]  [address] [place], [address]   COLONOSCOPY PROCEDURE REPORT  PATIENT: Colleen, Glenn  MR#: [id number] BIRTHDATE: Jul 07, 1941 , 73  yrs. old GENDER: female ENDOSCOPIST: Eustace Quail, MD REFERRED LP:[id number] Program Recall PROCEDURE DATE:  03/15/2015 PROCEDURE:   Colonoscopy, surveillance and Colonoscopy with snare polypectomy x 1 First Screening Colonoscopy - Avg.  risk and is 50 yrs.  old or older - No.  Prior Negative Screening - Now for repeat screening. N/A  History of Adenoma - Now for follow-up colonoscopy & has been > or = to 3 yrs.  Yes hx of adenoma.  Has been 3 or more years since last colonoscopy.  Polyps removed today? Yes ASA CLASS:   Class II INDICATIONS:Surveillance due to prior colonic neoplasia and PH Colon Adenoma. . Last examined 2011 with small tubular adenomas MEDICATIONS: Monitored anesthesia care and Propofol 280 mg IV  DESCRIPTION OF PROCEDURE:   After the risks benefits and alternatives of the procedure were thoroughly explained, informed consent was obtained.  The digital rectal exam revealed no abnormalities of the rectum.   The [id number]  endoscope was introduced through the anus and advanced to the cecum, which was identified by both the appendix and ileocecal valve. No adverse events experienced.   The quality of the prep was excellent. (MoviPrep was used)  The instrument was then slowly withdrawn as the colon was fully examined. Estimated blood loss is zero unless otherwise noted in this procedure report.  COLON FINDINGS: A sessile polyp measuring 6 mm in size was found in the descending colon.  A polypectomy was performed with a cold snare.  The resection was complete, the polyp tissue was completely retrieved and sent to histology.   There was moderate diverticulosis noted in the left colon and right colon.   The examination was otherwise normal.  Retroflexed views revealed no abnormalities.  The time to cecum = 6.3 Withdrawal time = 13.8   The scope was withdrawn and the procedure completed. COMPLICATIONS: There were no immediate complications.  ENDOSCOPIC IMPRESSION: 1.   Sessile polyp was found in the descending colon; polypectomy was performed with a cold snare 2.   Moderate diverticulosis was noted in the left colon and right colon 3.   The examination was otherwise normal  RECOMMENDATIONS: 1.  Repeat colonoscopy in 5 years if polyp adenomatous; otherwise prn 2.  Upper endoscopy (please see report)  eSigned:  Eustace Quail, MD 03/15/2015 3:27 PM   cc: Crist Infante, MD and The Patient

## 2015-03-15 NOTE — Op Note (Signed)
[place]  [address] [place], [id number]   ENDOSCOPY PROCEDURE REPORT  PATIENT: Colleen Glenn, Colleen Glenn  MR#: [id number] BIRTHDATE: [birth year] , 73  yrs. old GENDER: female ENDOSCOPIST: Eustace Quail, MD REFERRED BY:  Crist Infante, M.D. PROCEDURE DATE:  03/15/2015 PROCEDURE:  EGD, diagnostic ASA CLASS:     Class II INDICATIONS:  history of esophageal reflux.  symptoms despite PPI use MEDICATIONS: Monitored anesthesia care and Propofol 120 mg IV TOPICAL ANESTHETIC: none  DESCRIPTION OF PROCEDURE: After the risks benefits and alternatives of the procedure were thoroughly explained, informed consent was obtained.  The LB [id number] endoscope was introduced through the mouth and advanced to the second portion of the duodenum , Without limitations.  The instrument was slowly withdrawn as the mucosa was fully examined.  Esophagus revealed mild distal esophagitis as manifested by erythema and edema.  The stomach revealed evidence of prior bypass.  Native duodenum present.  Stenosis noted prior ulcer disease.  Retroflexed views revealed no abnormalities.     The scope was then withdrawn from the patient and the procedure completed.  COMPLICATIONS: There were no immediate complications.  ENDOSCOPIC IMPRESSION: 1.  GERD with mild esophagitis 2.  Prior gastric surgery, presumably from ulcer disease  RECOMMENDATIONS: 1.  Anti-reflux regimen to be followed.  Please review the information provided by the nurse 2.  Prescribe omeprazole 40 mg daily; #30; 11 refills 3.  Please call and make an office follow-up appointment with Dr. Henrene Pastor in about 4-6 weeks  REPEAT EXAM:  eSigned:  Eustace Quail, MD 03/15/2015 3:34 PM    AX:ENMM Perini, MD and The Patient

## 2015-03-15 NOTE — Patient Instructions (Addendum)
YOU HAD AN ENDOSCOPIC PROCEDURE TODAY AT THE [facility]:   Refer to the procedure report that was given to you for any specific questions about what was found during the examination.  If the procedure report does not answer your questions, please call your gastroenterologist to clarify.  If you requested that your care partner not be given the details of your procedure findings, then the procedure report has been included in a sealed envelope for you to review at your convenience later.  YOU SHOULD EXPECT: Some feelings of bloating in the abdomen. Passage of more gas than usual.  Walking can help get rid of the air that was put into your GI tract during the procedure and reduce the bloating. If you had a lower endoscopy (such as a colonoscopy or flexible sigmoidoscopy) you may notice spotting of blood in your stool or on the toilet paper. If you underwent a bowel prep for your procedure, you may not have a normal bowel movement for a few days.  Please Note:  You might notice some irritation and congestion in your nose or some drainage.  This is from the oxygen used during your procedure.  There is no need for concern and it should clear up in a day or so.  SYMPTOMS TO REPORT IMMEDIATELY:   Following lower endoscopy (colonoscopy or flexible sigmoidoscopy):  Excessive amounts of blood in the stool  Significant tenderness or worsening of abdominal pains  Swelling of the abdomen that is new, acute  Fever of 100F or higher   Following upper endoscopy (EGD)  Vomiting of blood or coffee ground material  New chest pain or pain under the shoulder blades  Painful or persistently difficult swallowing  New shortness of breath  Fever of 100F or higher  Black, tarry-looking stools  For urgent or emergent issues, a gastroenterologist can be reached at any hour by calling [phone].   DIET: Your first meal following the procedure should be a small meal and then it is ok to progress to  your normal diet. Heavy or fried foods are harder to digest and may make you feel nauseous or bloated.  Likewise, meals heavy in dairy and vegetables can increase bloating.  Drink plenty of fluids but you should avoid alcoholic beverages for 24 hours.  ACTIVITY:  You should plan to take it easy for the rest of today and you should NOT DRIVE or use heavy machinery until tomorrow (because of the sedation medicines used during the test).    FOLLOW UP: Our staff will call the number listed on your records the next business day following your procedure to check on you and address any questions or concerns that you may have regarding the information given to you following your procedure. If we do not reach you, we will leave a message.  However, if you are feeling well and you are not experiencing any problems, there is no need to return our call.  We will assume that you have returned to your regular daily activities without incident.  If any biopsies were taken you will be contacted by phone or by letter within the next 1-3 weeks.  Please call us at [phone] if you have not heard about the biopsies in 3 weeks.    SIGNATURES/CONFIDENTIALITY: You and/or your care partner have signed paperwork which will be entered into your electronic medical record.  These signatures attest to the fact that that the information above on your After Visit Summary has been reviewed   and is understood.  Full responsibility of the confidentiality of this discharge information lies with you and/or your care-partner.   Information on polyps & diverticulosis & high fiber diet given to you today  Anti reflux measures given to you today ( Information on GERD)  Make an appointment to see Dr Henrene Pastor in about 4-6 weeks   Omeprazole 40 mg daily  ( sent to your pharmacy)  Await pathology results

## 2015-03-16 ENCOUNTER — Telehealth: Payer: Self-pay | Admitting: *Deleted

## 2015-03-16 NOTE — Telephone Encounter (Signed)
  Follow up Call-  Call back number 03/15/2015  Post procedure Call Back phone  # [phone]  Permission to leave phone message Yes     Patient questions:  Do you have a fever, pain , or abdominal swelling? No. Pain Score  0 *  Have you tolerated food without any problems? Yes.    Have you been able to return to your normal activities? Yes.    Do you have any questions about your discharge instructions: Diet   No. Medications  No. Follow up visit  No.  Do you have questions or concerns about your Care? No.  Actions: * If pain score is 4 or above: No action needed, pain <4.

## 2015-03-22 ENCOUNTER — Ambulatory Visit
Admission: RE | Admit: 2015-03-22 | Discharge: 2015-03-22 | Disposition: A | Discharge status: Home / Self Care | Payer: Medicare Other | Source: Ambulatory Visit

## 2015-03-22 ENCOUNTER — Encounter: Payer: Self-pay | Admitting: Internal Medicine

## 2015-03-22 DIAGNOSIS — Z1231 Encounter for screening mammogram for malignant neoplasm of breast: Secondary | ICD-10-CM | POA: Diagnosis not present

## 2015-04-20 ENCOUNTER — Ambulatory Visit: Payer: BLUE CROSS/BLUE SHIELD | Admitting: Internal Medicine

## 2015-06-19 ENCOUNTER — Ambulatory Visit (INDEPENDENT_AMBULATORY_CARE_PROVIDER_SITE_OTHER): Payer: Medicare Other | Admitting: Internal Medicine

## 2015-06-19 ENCOUNTER — Encounter: Payer: Self-pay | Admitting: Internal Medicine

## 2015-06-19 VITALS — BP 164/88 | HR 84 | Ht 64.57 in | Wt 178.5 lb

## 2015-06-19 DIAGNOSIS — M7989 Other specified soft tissue disorders: Secondary | ICD-10-CM | POA: Diagnosis not present

## 2015-06-19 DIAGNOSIS — Z6829 Body mass index (BMI) 29.0-29.9, adult: Secondary | ICD-10-CM | POA: Diagnosis not present

## 2015-06-19 DIAGNOSIS — D124 Benign neoplasm of descending colon: Secondary | ICD-10-CM

## 2015-06-19 DIAGNOSIS — K219 Gastro-esophageal reflux disease without esophagitis: Secondary | ICD-10-CM

## 2015-06-19 DIAGNOSIS — I10 Essential (primary) hypertension: Secondary | ICD-10-CM | POA: Diagnosis not present

## 2015-06-19 NOTE — Progress Notes (Signed)
HISTORY OF PRESENT ILLNESS:  Colleen Glenn is a 74 y.o. female was last evaluated 03/15/2015 for surveillance colonoscopy and refractory reflux symptoms. Colonoscopy revealed a 6 mm descending colon polyp which was removed with cold snare and found to be a tubular adenoma. As well moderate left-sided and right-sided diverticulosis. Follow-up colonoscopy in 5 years recommended. Upper endoscopy revealed mild esophagitis as well as evidence of prior gastric surgery (appeared to be ulcer surgery, bypass duodenal stenosis). She was prescribed omeprazole 40 mg daily and advised to follow-up at this time. The patient has been compliant with medical therapy. She is pleased to report complete resolution of symptoms, which were quite severe. No other issues or complaints. Tolerating the medication well.  REVIEW OF SYSTEMS:  All non-GI ROS negative except for insomnia  Past Medical History  Diagnosis Date  . Depression   . Hyperlipidemia   . GERD (gastroesophageal reflux disease)   . Arthritis   . Plantar fasciitis     hx of  . Colon polyps     adenomatous  . Diverticulosis   . Hemorrhoids   . PUD (peptic ulcer disease)     Past Surgical History  Procedure Laterality Date  . Rotator cuff repair      right side  . Abdominal hysterectomy    . Tonsillectomy    . Appendectomy    . Back surgery    . Foot surgery      bilateral   . Vagotomy      approx. 35 years ago, states has a clamp mid epigastric area  . Tubal ligation    . Hemorrhoid surgery    . Lumbar laminectomy/decompression microdiscectomy  05/07/2012    Procedure: LUMBAR LAMINECTOMY/DECOMPRESSION MICRODISCECTOMY 1 LEVEL;  Surgeon: Eustace Moore, MD;  Location: [facility];  Service: Neurosurgery;  Laterality: Right;  Lumbar Laminectomy Decompression Microdiscectomy Lumbar Three-Four  . Total hip arthroplasty  11/16/2012    Procedure: TOTAL HIP ARTHROPLASTY;  Surgeon: Gearlean Alf, MD;  Location: [facility];  Service: Orthopedics;   Laterality: Right;  Right Total Hip Arthroplasty  . Colonoscopy    . Polypectomy      Social History Colleen Glenn  reports that she has quit smoking. She has never used smokeless tobacco. She reports that she drinks about 1.2 oz of alcohol per week. She reports that she does not use illicit drugs.  family history includes Colon polyps in her mother. There is no history of Colon cancer, Rectal cancer, or Stomach cancer.  No Known Allergies     PHYSICAL EXAMINATION: Vital signs: BP 164/88 mmHg  Pulse 84  Ht 5' 4.57" (1.64 m)  Wt 178 lb 8 oz (80.967 kg)  BMI 30.10 kg/m2 General: Well-developed, well-nourished, no acute distress Abdomen: Not reexamined Psychiatric: alert and oriented x3. Cooperative  ASSESSMENT:  #1. GERD with endoscopic evidence of mild esophagitis. Excellent response to omeprazole 40 mg daily #2. Adenomatous colon polyp status post polypectomy  PLAN:  #1. Reflux precautions #2. Continue omeprazole 40 mg daily. Prescribed #3. Routine GI follow-up one year. Sooner if needed #4. Surveillance colonoscopy around May 2021

## 2015-06-19 NOTE — Patient Instructions (Signed)
Please follow up in one year 

## 2015-06-20 ENCOUNTER — Other Ambulatory Visit: Payer: Self-pay | Admitting: Internal Medicine

## 2015-06-20 ENCOUNTER — Ambulatory Visit (HOSPITAL_COMMUNITY)
Admission: RE | Admit: 2015-06-20 | Discharge: 2015-06-20 | Disposition: A | Discharge status: Home / Self Care | Payer: Medicare Other | Source: Ambulatory Visit | Attending: Vascular Surgery | Admitting: Vascular Surgery

## 2015-06-20 DIAGNOSIS — M7989 Other specified soft tissue disorders: Secondary | ICD-10-CM | POA: Insufficient documentation

## 2015-08-18 DIAGNOSIS — Z23 Encounter for immunization: Secondary | ICD-10-CM | POA: Diagnosis not present

## 2015-10-25 DIAGNOSIS — Z96641 Presence of right artificial hip joint: Secondary | ICD-10-CM | POA: Diagnosis not present

## 2015-10-25 DIAGNOSIS — Z471 Aftercare following joint replacement surgery: Secondary | ICD-10-CM | POA: Diagnosis not present

## 2015-10-25 DIAGNOSIS — M5136 Other intervertebral disc degeneration, lumbar region: Secondary | ICD-10-CM | POA: Diagnosis not present

## 2015-11-13 DIAGNOSIS — M542 Cervicalgia: Secondary | ICD-10-CM | POA: Diagnosis not present

## 2015-11-13 DIAGNOSIS — Z6829 Body mass index (BMI) 29.0-29.9, adult: Secondary | ICD-10-CM | POA: Diagnosis not present

## 2015-11-13 DIAGNOSIS — M545 Low back pain: Secondary | ICD-10-CM | POA: Diagnosis not present

## 2015-11-16 DIAGNOSIS — M545 Low back pain: Secondary | ICD-10-CM | POA: Diagnosis not present

## 2015-11-17 DIAGNOSIS — M5136 Other intervertebral disc degeneration, lumbar region: Secondary | ICD-10-CM | POA: Diagnosis not present

## 2015-11-17 DIAGNOSIS — M50223 Other cervical disc displacement at C6-C7 level: Secondary | ICD-10-CM | POA: Diagnosis not present

## 2015-11-17 DIAGNOSIS — M545 Low back pain: Secondary | ICD-10-CM | POA: Diagnosis not present

## 2015-11-17 DIAGNOSIS — M5126 Other intervertebral disc displacement, lumbar region: Secondary | ICD-10-CM | POA: Diagnosis not present

## 2015-11-17 DIAGNOSIS — M542 Cervicalgia: Secondary | ICD-10-CM | POA: Diagnosis not present

## 2015-11-29 DIAGNOSIS — M79671 Pain in right foot: Secondary | ICD-10-CM | POA: Diagnosis not present

## 2015-11-29 DIAGNOSIS — M2042 Other hammer toe(s) (acquired), left foot: Secondary | ICD-10-CM | POA: Diagnosis not present

## 2015-12-04 DIAGNOSIS — M545 Low back pain: Secondary | ICD-10-CM | POA: Diagnosis not present

## 2015-12-04 DIAGNOSIS — M542 Cervicalgia: Secondary | ICD-10-CM | POA: Diagnosis not present

## 2016-01-29 ENCOUNTER — Encounter: Payer: Self-pay | Admitting: Internal Medicine

## 2016-02-22 DIAGNOSIS — E538 Deficiency of other specified B group vitamins: Secondary | ICD-10-CM | POA: Diagnosis not present

## 2016-02-22 DIAGNOSIS — E784 Other hyperlipidemia: Secondary | ICD-10-CM | POA: Diagnosis not present

## 2016-02-22 DIAGNOSIS — I10 Essential (primary) hypertension: Secondary | ICD-10-CM | POA: Diagnosis not present

## 2016-02-22 DIAGNOSIS — R7301 Impaired fasting glucose: Secondary | ICD-10-CM | POA: Diagnosis not present

## 2016-02-22 DIAGNOSIS — M859 Disorder of bone density and structure, unspecified: Secondary | ICD-10-CM | POA: Diagnosis not present

## 2016-02-28 DIAGNOSIS — Z0000 Encounter for general adult medical examination without abnormal findings: Secondary | ICD-10-CM | POA: Diagnosis not present

## 2016-02-28 DIAGNOSIS — M859 Disorder of bone density and structure, unspecified: Secondary | ICD-10-CM | POA: Diagnosis not present

## 2016-02-28 DIAGNOSIS — Z1231 Encounter for screening mammogram for malignant neoplasm of breast: Secondary | ICD-10-CM | POA: Diagnosis not present

## 2016-02-28 DIAGNOSIS — R808 Other proteinuria: Secondary | ICD-10-CM | POA: Diagnosis not present

## 2016-02-28 DIAGNOSIS — Z1389 Encounter for screening for other disorder: Secondary | ICD-10-CM | POA: Diagnosis not present

## 2016-02-28 DIAGNOSIS — M5136 Other intervertebral disc degeneration, lumbar region: Secondary | ICD-10-CM | POA: Diagnosis not present

## 2016-02-28 DIAGNOSIS — E784 Other hyperlipidemia: Secondary | ICD-10-CM | POA: Diagnosis not present

## 2016-02-28 DIAGNOSIS — F329 Major depressive disorder, single episode, unspecified: Secondary | ICD-10-CM | POA: Diagnosis not present

## 2016-02-28 DIAGNOSIS — D126 Benign neoplasm of colon, unspecified: Secondary | ICD-10-CM | POA: Diagnosis not present

## 2016-02-28 DIAGNOSIS — E538 Deficiency of other specified B group vitamins: Secondary | ICD-10-CM | POA: Diagnosis not present

## 2016-02-28 DIAGNOSIS — Z6828 Body mass index (BMI) 28.0-28.9, adult: Secondary | ICD-10-CM | POA: Diagnosis not present

## 2016-02-28 DIAGNOSIS — I10 Essential (primary) hypertension: Secondary | ICD-10-CM | POA: Diagnosis not present

## 2016-02-29 DIAGNOSIS — Z1212 Encounter for screening for malignant neoplasm of rectum: Secondary | ICD-10-CM | POA: Diagnosis not present

## 2016-03-05 ENCOUNTER — Other Ambulatory Visit: Payer: Self-pay | Admitting: Internal Medicine

## 2016-06-04 DIAGNOSIS — Z23 Encounter for immunization: Secondary | ICD-10-CM | POA: Diagnosis not present

## 2016-07-18 DIAGNOSIS — H2513 Age-related nuclear cataract, bilateral: Secondary | ICD-10-CM | POA: Diagnosis not present

## 2016-08-13 DIAGNOSIS — Z471 Aftercare following joint replacement surgery: Secondary | ICD-10-CM | POA: Diagnosis not present

## 2016-08-13 DIAGNOSIS — M76891 Other specified enthesopathies of right lower limb, excluding foot: Secondary | ICD-10-CM | POA: Diagnosis not present

## 2016-08-13 DIAGNOSIS — M7061 Trochanteric bursitis, right hip: Secondary | ICD-10-CM | POA: Diagnosis not present

## 2016-08-13 DIAGNOSIS — Z96641 Presence of right artificial hip joint: Secondary | ICD-10-CM | POA: Diagnosis not present

## 2016-08-23 DIAGNOSIS — M7061 Trochanteric bursitis, right hip: Secondary | ICD-10-CM | POA: Diagnosis not present

## 2016-08-28 DIAGNOSIS — M7061 Trochanteric bursitis, right hip: Secondary | ICD-10-CM | POA: Diagnosis not present

## 2016-08-30 DIAGNOSIS — M7061 Trochanteric bursitis, right hip: Secondary | ICD-10-CM | POA: Diagnosis not present

## 2016-09-04 DIAGNOSIS — M7061 Trochanteric bursitis, right hip: Secondary | ICD-10-CM | POA: Diagnosis not present

## 2016-09-09 DIAGNOSIS — M7061 Trochanteric bursitis, right hip: Secondary | ICD-10-CM | POA: Diagnosis not present

## 2016-09-11 DIAGNOSIS — M7061 Trochanteric bursitis, right hip: Secondary | ICD-10-CM | POA: Diagnosis not present

## 2016-09-16 DIAGNOSIS — M7061 Trochanteric bursitis, right hip: Secondary | ICD-10-CM | POA: Diagnosis not present

## 2016-09-18 DIAGNOSIS — M7061 Trochanteric bursitis, right hip: Secondary | ICD-10-CM | POA: Diagnosis not present

## 2016-09-24 DIAGNOSIS — M7061 Trochanteric bursitis, right hip: Secondary | ICD-10-CM | POA: Diagnosis not present

## 2016-10-10 ENCOUNTER — Other Ambulatory Visit: Payer: Self-pay | Admitting: Internal Medicine

## 2017-03-19 DIAGNOSIS — M859 Disorder of bone density and structure, unspecified: Secondary | ICD-10-CM | POA: Diagnosis not present

## 2017-03-19 DIAGNOSIS — R808 Other proteinuria: Secondary | ICD-10-CM | POA: Diagnosis not present

## 2017-03-19 DIAGNOSIS — E538 Deficiency of other specified B group vitamins: Secondary | ICD-10-CM | POA: Diagnosis not present

## 2017-03-19 DIAGNOSIS — E784 Other hyperlipidemia: Secondary | ICD-10-CM | POA: Diagnosis not present

## 2017-03-24 DIAGNOSIS — F3289 Other specified depressive episodes: Secondary | ICD-10-CM | POA: Diagnosis not present

## 2017-03-24 DIAGNOSIS — R808 Other proteinuria: Secondary | ICD-10-CM | POA: Diagnosis not present

## 2017-03-24 DIAGNOSIS — M859 Disorder of bone density and structure, unspecified: Secondary | ICD-10-CM | POA: Diagnosis not present

## 2017-03-24 DIAGNOSIS — E538 Deficiency of other specified B group vitamins: Secondary | ICD-10-CM | POA: Diagnosis not present

## 2017-03-24 DIAGNOSIS — Z0000 Encounter for general adult medical examination without abnormal findings: Secondary | ICD-10-CM | POA: Diagnosis not present

## 2017-03-24 DIAGNOSIS — M5430 Sciatica, unspecified side: Secondary | ICD-10-CM | POA: Diagnosis not present

## 2017-03-24 DIAGNOSIS — F418 Other specified anxiety disorders: Secondary | ICD-10-CM | POA: Diagnosis not present

## 2017-03-24 DIAGNOSIS — R7301 Impaired fasting glucose: Secondary | ICD-10-CM | POA: Diagnosis not present

## 2017-03-24 DIAGNOSIS — Z6828 Body mass index (BMI) 28.0-28.9, adult: Secondary | ICD-10-CM | POA: Diagnosis not present

## 2017-03-24 DIAGNOSIS — M7989 Other specified soft tissue disorders: Secondary | ICD-10-CM | POA: Diagnosis not present

## 2017-03-24 DIAGNOSIS — Z1389 Encounter for screening for other disorder: Secondary | ICD-10-CM | POA: Diagnosis not present

## 2017-03-24 DIAGNOSIS — M5136 Other intervertebral disc degeneration, lumbar region: Secondary | ICD-10-CM | POA: Diagnosis not present

## 2017-03-31 ENCOUNTER — Other Ambulatory Visit: Payer: Self-pay

## 2017-03-31 MED ORDER — OMEPRAZOLE 40 MG PO CPDR: DELAYED_RELEASE_CAPSULE | ORAL | 1 refills | Status: DC

## 2017-07-01 DIAGNOSIS — Z6828 Body mass index (BMI) 28.0-28.9, adult: Secondary | ICD-10-CM | POA: Diagnosis not present

## 2017-07-01 DIAGNOSIS — R109 Unspecified abdominal pain: Secondary | ICD-10-CM | POA: Diagnosis not present

## 2017-07-01 DIAGNOSIS — R93421 Abnormal radiologic findings on diagnostic imaging of right kidney: Secondary | ICD-10-CM | POA: Diagnosis not present

## 2017-07-01 DIAGNOSIS — K5730 Diverticulosis of large intestine without perforation or abscess without bleeding: Secondary | ICD-10-CM | POA: Diagnosis not present

## 2017-07-01 DIAGNOSIS — R1084 Generalized abdominal pain: Secondary | ICD-10-CM | POA: Diagnosis not present

## 2017-07-01 DIAGNOSIS — K8020 Calculus of gallbladder without cholecystitis without obstruction: Secondary | ICD-10-CM | POA: Diagnosis not present

## 2017-08-18 DIAGNOSIS — Z23 Encounter for immunization: Secondary | ICD-10-CM | POA: Diagnosis not present

## 2017-09-08 ENCOUNTER — Other Ambulatory Visit: Payer: Self-pay | Admitting: Internal Medicine

## 2017-12-06 ENCOUNTER — Other Ambulatory Visit: Payer: Self-pay | Admitting: Internal Medicine

## 2017-12-18 ENCOUNTER — Other Ambulatory Visit: Payer: Self-pay | Admitting: Internal Medicine

## 2018-01-06 ENCOUNTER — Telehealth: Payer: Self-pay | Admitting: Internal Medicine

## 2018-01-06 MED ORDER — OMEPRAZOLE 40 MG PO CPDR: DELAYED_RELEASE_CAPSULE | ORAL | 0 refills | Status: DC

## 2018-01-06 NOTE — Telephone Encounter (Signed)
Patient requesting a refill of medication omeprazole sent to [facility] until F/U appt on 5.3.19.

## 2018-02-20 ENCOUNTER — Ambulatory Visit: Payer: Medicare Other | Admitting: Internal Medicine

## 2018-04-04 ENCOUNTER — Other Ambulatory Visit: Payer: Self-pay | Admitting: Internal Medicine

## 2018-04-13 ENCOUNTER — Ambulatory Visit: Payer: Medicare Other | Admitting: Internal Medicine

## 2018-04-29 DIAGNOSIS — K8020 Calculus of gallbladder without cholecystitis without obstruction: Secondary | ICD-10-CM | POA: Diagnosis not present

## 2018-04-29 DIAGNOSIS — N281 Cyst of kidney, acquired: Secondary | ICD-10-CM | POA: Diagnosis not present

## 2018-05-22 DIAGNOSIS — I10 Essential (primary) hypertension: Secondary | ICD-10-CM | POA: Diagnosis not present

## 2018-05-22 DIAGNOSIS — M859 Disorder of bone density and structure, unspecified: Secondary | ICD-10-CM | POA: Diagnosis not present

## 2018-05-22 DIAGNOSIS — E7849 Other hyperlipidemia: Secondary | ICD-10-CM | POA: Diagnosis not present

## 2018-05-22 DIAGNOSIS — R7301 Impaired fasting glucose: Secondary | ICD-10-CM | POA: Diagnosis not present

## 2018-05-22 DIAGNOSIS — R82998 Other abnormal findings in urine: Secondary | ICD-10-CM | POA: Diagnosis not present

## 2018-05-22 DIAGNOSIS — E538 Deficiency of other specified B group vitamins: Secondary | ICD-10-CM | POA: Diagnosis not present

## 2018-05-25 DIAGNOSIS — I10 Essential (primary) hypertension: Secondary | ICD-10-CM | POA: Diagnosis not present

## 2018-05-25 DIAGNOSIS — E7849 Other hyperlipidemia: Secondary | ICD-10-CM | POA: Diagnosis not present

## 2018-05-29 DIAGNOSIS — M5136 Other intervertebral disc degeneration, lumbar region: Secondary | ICD-10-CM | POA: Diagnosis not present

## 2018-05-29 DIAGNOSIS — F418 Other specified anxiety disorders: Secondary | ICD-10-CM | POA: Diagnosis not present

## 2018-05-29 DIAGNOSIS — N2889 Other specified disorders of kidney and ureter: Secondary | ICD-10-CM | POA: Diagnosis not present

## 2018-05-29 DIAGNOSIS — E538 Deficiency of other specified B group vitamins: Secondary | ICD-10-CM | POA: Diagnosis not present

## 2018-05-29 DIAGNOSIS — D126 Benign neoplasm of colon, unspecified: Secondary | ICD-10-CM | POA: Diagnosis not present

## 2018-05-29 DIAGNOSIS — Z6829 Body mass index (BMI) 29.0-29.9, adult: Secondary | ICD-10-CM | POA: Diagnosis not present

## 2018-05-29 DIAGNOSIS — F3289 Other specified depressive episodes: Secondary | ICD-10-CM | POA: Diagnosis not present

## 2018-05-29 DIAGNOSIS — M859 Disorder of bone density and structure, unspecified: Secondary | ICD-10-CM | POA: Diagnosis not present

## 2018-05-29 DIAGNOSIS — Z0000 Encounter for general adult medical examination without abnormal findings: Secondary | ICD-10-CM | POA: Diagnosis not present

## 2018-05-29 DIAGNOSIS — R7301 Impaired fasting glucose: Secondary | ICD-10-CM | POA: Diagnosis not present

## 2018-05-29 DIAGNOSIS — Z1389 Encounter for screening for other disorder: Secondary | ICD-10-CM | POA: Diagnosis not present

## 2018-05-29 DIAGNOSIS — I10 Essential (primary) hypertension: Secondary | ICD-10-CM | POA: Diagnosis not present

## 2018-06-02 ENCOUNTER — Other Ambulatory Visit: Payer: Self-pay | Admitting: Internal Medicine

## 2018-06-02 DIAGNOSIS — Z1231 Encounter for screening mammogram for malignant neoplasm of breast: Secondary | ICD-10-CM

## 2018-07-01 ENCOUNTER — Ambulatory Visit
Admission: RE | Admit: 2018-07-01 | Discharge: 2018-07-01 | Disposition: A | Discharge status: Home / Self Care | Payer: Medicare Other | Source: Ambulatory Visit | Attending: Internal Medicine | Admitting: Internal Medicine

## 2018-07-01 DIAGNOSIS — Z1231 Encounter for screening mammogram for malignant neoplasm of breast: Secondary | ICD-10-CM

## 2018-07-04 ENCOUNTER — Other Ambulatory Visit: Payer: Self-pay | Admitting: Internal Medicine

## 2018-07-07 ENCOUNTER — Encounter (INDEPENDENT_AMBULATORY_CARE_PROVIDER_SITE_OTHER): Payer: Self-pay

## 2018-07-07 ENCOUNTER — Ambulatory Visit (INDEPENDENT_AMBULATORY_CARE_PROVIDER_SITE_OTHER): Payer: Medicare Other | Admitting: Internal Medicine

## 2018-07-07 ENCOUNTER — Encounter: Payer: Self-pay | Admitting: Internal Medicine

## 2018-07-07 VITALS — BP 128/70 | HR 88 | Ht 65.0 in | Wt 167.0 lb

## 2018-07-07 DIAGNOSIS — R935 Abnormal findings on diagnostic imaging of other abdominal regions, including retroperitoneum: Secondary | ICD-10-CM | POA: Diagnosis not present

## 2018-07-07 DIAGNOSIS — R1011 Right upper quadrant pain: Secondary | ICD-10-CM

## 2018-07-07 DIAGNOSIS — K2100 Gastro-esophageal reflux disease with esophagitis, without bleeding: Secondary | ICD-10-CM

## 2018-07-07 DIAGNOSIS — R140 Abdominal distension (gaseous): Secondary | ICD-10-CM | POA: Diagnosis not present

## 2018-07-07 DIAGNOSIS — K8020 Calculus of gallbladder without cholecystitis without obstruction: Secondary | ICD-10-CM | POA: Diagnosis not present

## 2018-07-07 DIAGNOSIS — Z86010 Personal history of colonic polyps: Secondary | ICD-10-CM | POA: Diagnosis not present

## 2018-07-07 DIAGNOSIS — R1084 Generalized abdominal pain: Secondary | ICD-10-CM | POA: Diagnosis not present

## 2018-07-07 MED ORDER — OMEPRAZOLE 40 MG PO CPDR: DELAYED_RELEASE_CAPSULE | ORAL | 3 refills | Status: DC

## 2018-07-07 NOTE — Patient Instructions (Signed)
We have sent the following medications to your pharmacy for you to pick up at your convenience:  Omeprazole  You will contacted by [facility] regarding and appointment with a surgeon

## 2018-07-07 NOTE — Progress Notes (Signed)
HISTORY OF PRESENT ILLNESS:  Colleen Glenn is a pleasant 77 y.o. female with chronic GERD, adenomatous colon polyps, and remote peptic ulcer surgery who presents herself today for evaluation of a myriad GI complaints at the urging of her caring daughter who accompanies the patient today. She was last evaluated 06/19/2015 at which time her reflux was well controlled on omeprazole 40 mg daily. She was told to follow-up in one year but did not until this time. Patient's daughter is primarily concerned about gallbladder disease. First, in terms of reflux, the patient has been doing well except for one episode at night 6 months ago when she developed severe regurgitation with burning. Not improved after drinking water. No recurrent episodes since. Overall symptoms are well controlled on medication. Saw Dr. Joylene Draft recently who increased her PPI to twice a day. Next, the patient reports having had a severe episode of right upper quadrant/right side pain approximately 1 year ago. Last several hours and resolved without recurrence. Nausea but no vomiting. Review of outside CT scan from September 2018 revealed gallstones. As well hypodense lesion of the kidney for which follow-up ultrasound recommended. Next, the patient reports chronic problems with postprandial bloating or swelling associated with severe abdominal pain that begins in the epigastric region and may involve the mid and lower abdomen. Occurs once or twice every 2 weeks and tends to last for hours. She needs to lie down. Her bowel habits are regular. The pain is not affected by defecation. She did undergo abdominal ultrasound 04/29/2018. This revealed gallstones. Most recent laboratories have been reviewed and are unremarkable. She did undergo complete colonoscopy and upper endoscopy 03/15/2015. Colonoscopy revealed moderate diverticulosis and a tubular adenoma which was removed. Follow-up in 5 years recommended. Upper endoscopy revealed mild esophagitis  and evidence of prior surgery.  REVIEW OF SYSTEMS:  All non-GI ROS negative unless otherwise stated in the history of present illness except for anxiety, arthritis, back pain, hearing problems, muscle cramps, sleeping problems, ankle swelling, excessive urination, urinary frequency  Past Medical History:  Diagnosis Date  . Arthritis   . Colon polyps    adenomatous  . Depression   . Diverticulosis   . GERD (gastroesophageal reflux disease)   . Hemorrhoids   . Hyperlipidemia   . Plantar fasciitis    hx of  . PUD (peptic ulcer disease)     Past Surgical History:  Procedure Laterality Date  . ABDOMINAL HYSTERECTOMY    . APPENDECTOMY    . BACK SURGERY    . COLONOSCOPY    . FOOT SURGERY     bilateral   . HEMORRHOID SURGERY    . LUMBAR LAMINECTOMY/DECOMPRESSION MICRODISCECTOMY  05/07/2012   Procedure: LUMBAR LAMINECTOMY/DECOMPRESSION MICRODISCECTOMY 1 LEVEL;  Surgeon: Eustace Moore, MD;  Location: [facility];  Service: Neurosurgery;  Laterality: Right;  Lumbar Laminectomy Decompression Microdiscectomy Lumbar Three-Four  . POLYPECTOMY    . ROTATOR CUFF REPAIR     right side  . TONSILLECTOMY    . TOTAL HIP ARTHROPLASTY  11/16/2012   Procedure: TOTAL HIP ARTHROPLASTY;  Surgeon: Gearlean Alf, MD;  Location: [facility];  Service: Orthopedics;  Laterality: Right;  Right Total Hip Arthroplasty  . TUBAL LIGATION    . VAGOTOMY     approx. 35 years ago, states has a clamp mid epigastric area    Social History Colleen Glenn  reports that she has quit smoking. She has never used smokeless tobacco. She reports that she drinks about 2.0 standard drinks of  alcohol per week. She reports that she does not use drugs.  family history includes Colon polyps in her mother.  No Known Allergies     PHYSICAL EXAMINATION: Vital signs: BP 128/70   Pulse 88   Ht '5\' 5"'$  (1.651 m)   Wt 167 lb (75.8 kg)   BMI 27.79 kg/m   Constitutional: generally well-appearing, no acute distress Psychiatric:  alert and oriented x3, cooperative.Anxious Eyes: extraocular movements intact, anicteric, conjunctiva pink Mouth: oral pharynx moist, no lesions Neck: supple no lymphadenopathy Cardiovascular: heart regular rate and rhythm, no murmur Lungs: clear to auscultation bilaterally Abdomen: soft, nontender, nondistended, no obvious ascites, no peritoneal signs, normal bowel sounds, no organomegaly. Surgical incisions well-healed with slight wound diastases Rectal: omitted Extremities: no clubbing, cyanosis, or lower extremity edema bilaterally Skin: no lesions on visible extremities Neuro: No focal deficits. Cranial nerves intact. Normal DTRs  ASSESSMENT:  #1. Cholelithiasis. Problem last year with right-sided pain would be consistent with symptomatic cholelithiasis. More frequent problems with postprandial pain and also represent gallbladder disease. #2. GERD with endoscopic evidence of esophagitis. Asymptomatic on PPI. One episode of severe breakthrough at night due to recumbent nature. #3. History of peptic ulcer surgery remotely #4. History of adenomatous colon polyps on colonoscopy 2016   PLAN:  #1. Obtain general surgical opinion regarding cholelithiasis and its possible relationship to abdominal complaints and the need for laparoscopic cholecystectomy. We have contacted the surgical office who will be in touch with the patient #2. Reflux precautions. Reviewed #3. Reduce PPI to 40 mg once daily as this was effective except for the isolated episode as described. She understands #4. Surveillance colonoscopy 2021. She is aware #5. Otherwise, routine GI follow-up one year #6. Ongoing general medical care with Dr. Joylene Draft  60 minutes spent face-to-face with the patient. Greater than 50% a time use for counseling Regarding her problems with GERD, the significance of cholelithiasis, abdominal pain, and follow-up recommendations. As well answering multiple xcellent questions, particular from her  daughter

## 2018-07-13 ENCOUNTER — Telehealth: Payer: Self-pay

## 2018-08-04 NOTE — Telephone Encounter (Signed)
error 

## 2018-09-22 DIAGNOSIS — M25551 Pain in right hip: Secondary | ICD-10-CM | POA: Diagnosis not present

## 2018-09-22 DIAGNOSIS — M25561 Pain in right knee: Secondary | ICD-10-CM | POA: Diagnosis not present

## 2018-10-09 DIAGNOSIS — W010XXA Fall on same level from slipping, tripping and stumbling without subsequent striking against object, initial encounter: Secondary | ICD-10-CM | POA: Diagnosis not present

## 2018-10-09 DIAGNOSIS — Z6828 Body mass index (BMI) 28.0-28.9, adult: Secondary | ICD-10-CM | POA: Diagnosis not present

## 2018-10-09 DIAGNOSIS — M25561 Pain in right knee: Secondary | ICD-10-CM | POA: Diagnosis not present

## 2018-10-19 DIAGNOSIS — M25561 Pain in right knee: Secondary | ICD-10-CM | POA: Diagnosis not present

## 2018-10-26 DIAGNOSIS — M25561 Pain in right knee: Secondary | ICD-10-CM | POA: Diagnosis not present

## 2018-10-28 DIAGNOSIS — M25561 Pain in right knee: Secondary | ICD-10-CM | POA: Diagnosis not present

## 2018-11-05 DIAGNOSIS — M94261 Chondromalacia, right knee: Secondary | ICD-10-CM | POA: Diagnosis not present

## 2018-11-05 DIAGNOSIS — G8918 Other acute postprocedural pain: Secondary | ICD-10-CM | POA: Diagnosis not present

## 2018-11-05 DIAGNOSIS — S83241A Other tear of medial meniscus, current injury, right knee, initial encounter: Secondary | ICD-10-CM | POA: Diagnosis not present

## 2018-11-05 DIAGNOSIS — S83271A Complex tear of lateral meniscus, current injury, right knee, initial encounter: Secondary | ICD-10-CM | POA: Diagnosis not present

## 2018-11-05 DIAGNOSIS — M2341 Loose body in knee, right knee: Secondary | ICD-10-CM | POA: Diagnosis not present

## 2018-11-05 DIAGNOSIS — X58XXXA Exposure to other specified factors, initial encounter: Secondary | ICD-10-CM | POA: Diagnosis not present

## 2018-11-05 DIAGNOSIS — S83231A Complex tear of medial meniscus, current injury, right knee, initial encounter: Secondary | ICD-10-CM | POA: Diagnosis not present

## 2018-11-05 DIAGNOSIS — Y999 Unspecified external cause status: Secondary | ICD-10-CM | POA: Diagnosis not present

## 2018-11-05 DIAGNOSIS — S83281A Other tear of lateral meniscus, current injury, right knee, initial encounter: Secondary | ICD-10-CM | POA: Diagnosis not present

## 2018-11-18 ENCOUNTER — Other Ambulatory Visit (HOSPITAL_COMMUNITY): Payer: Self-pay | Admitting: Orthopedic Surgery

## 2018-11-18 ENCOUNTER — Ambulatory Visit (HOSPITAL_COMMUNITY)
Admission: RE | Admit: 2018-11-18 | Discharge: 2018-11-18 | Disposition: A | Discharge status: Home / Self Care | Payer: Medicare Other | Source: Ambulatory Visit | Attending: Family | Admitting: Family

## 2018-11-18 DIAGNOSIS — M79604 Pain in right leg: Secondary | ICD-10-CM | POA: Diagnosis not present

## 2018-11-18 DIAGNOSIS — M7989 Other specified soft tissue disorders: Secondary | ICD-10-CM | POA: Diagnosis not present

## 2018-12-16 DIAGNOSIS — M2341 Loose body in knee, right knee: Secondary | ICD-10-CM | POA: Diagnosis not present

## 2019-05-24 ENCOUNTER — Ambulatory Visit
Admission: EM | Admit: 2019-05-24 | Discharge: 2019-05-24 | Disposition: A | Discharge status: Home / Self Care | Payer: Medicare Other | Attending: Emergency Medicine | Admitting: Emergency Medicine

## 2019-05-24 ENCOUNTER — Other Ambulatory Visit: Payer: Self-pay

## 2019-05-24 DIAGNOSIS — Z23 Encounter for immunization: Secondary | ICD-10-CM | POA: Diagnosis not present

## 2019-05-24 DIAGNOSIS — W19XXXA Unspecified fall, initial encounter: Secondary | ICD-10-CM

## 2019-05-24 DIAGNOSIS — S81811A Laceration without foreign body, right lower leg, initial encounter: Secondary | ICD-10-CM | POA: Diagnosis not present

## 2019-05-24 DIAGNOSIS — M79662 Pain in left lower leg: Secondary | ICD-10-CM | POA: Diagnosis not present

## 2019-05-24 DIAGNOSIS — R296 Repeated falls: Secondary | ICD-10-CM

## 2019-05-24 MED ORDER — TETANUS-DIPHTH-ACELL PERTUSSIS 5-2.5-18.5 LF-MCG/0.5 IM SUSP
0.5000 mL | Freq: Once | INTRAMUSCULAR | Status: AC
  Administered 2019-05-24: 18:00:00 0.5 mL via INTRAMUSCULAR
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
## 2019-05-24 NOTE — Discharge Instructions (Signed)
Recommended further evaluation and management in the ED due to unwitnessed fall.  Patient and family declines at this time and would like to have laceration sutured.  Patient and family aware of risk associated with this decision including missed diagnosis, organ damage, failure, and/or possibly death.  Patient aware and in agreement with this plan.    Tetanus updated Bandage applied Keep covered for next and dry for next 24-48 hours.  After then you may gently clean with warm water and mild soap.  Avoid submerging wound in water. Change dressing daily and apply a thin layer of neosporin.  Return in 10 days to have sutures removed.   Take OTC ibuprofen or tylenol as needed for pain releif Return sooner or go to the ED if you have any new or worsening symptoms such as increased pain, redness, swelling, drainage, discharge, decreased range of motion of extremity, etc..
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
## 2019-05-27 DIAGNOSIS — I499 Cardiac arrhythmia, unspecified: Secondary | ICD-10-CM | POA: Diagnosis not present

## 2019-05-27 DIAGNOSIS — I10 Essential (primary) hypertension: Secondary | ICD-10-CM | POA: Diagnosis not present

## 2019-05-27 DIAGNOSIS — F329 Major depressive disorder, single episode, unspecified: Secondary | ICD-10-CM | POA: Diagnosis not present

## 2019-05-27 DIAGNOSIS — F419 Anxiety disorder, unspecified: Secondary | ICD-10-CM | POA: Diagnosis not present

## 2019-05-27 DIAGNOSIS — E538 Deficiency of other specified B group vitamins: Secondary | ICD-10-CM | POA: Diagnosis not present

## 2019-05-27 DIAGNOSIS — R7301 Impaired fasting glucose: Secondary | ICD-10-CM | POA: Diagnosis not present

## 2019-05-27 DIAGNOSIS — M5136 Other intervertebral disc degeneration, lumbar region: Secondary | ICD-10-CM | POA: Diagnosis not present

## 2019-05-27 DIAGNOSIS — F4320 Adjustment disorder, unspecified: Secondary | ICD-10-CM | POA: Diagnosis not present

## 2019-05-27 DIAGNOSIS — M859 Disorder of bone density and structure, unspecified: Secondary | ICD-10-CM | POA: Diagnosis not present

## 2019-05-27 DIAGNOSIS — Z79899 Other long term (current) drug therapy: Secondary | ICD-10-CM | POA: Diagnosis not present

## 2019-05-31 DIAGNOSIS — I10 Essential (primary) hypertension: Secondary | ICD-10-CM | POA: Diagnosis not present

## 2019-05-31 DIAGNOSIS — Z5189 Encounter for other specified aftercare: Secondary | ICD-10-CM | POA: Diagnosis not present

## 2019-05-31 DIAGNOSIS — Z4802 Encounter for removal of sutures: Secondary | ICD-10-CM | POA: Diagnosis not present

## 2019-05-31 DIAGNOSIS — R82998 Other abnormal findings in urine: Secondary | ICD-10-CM | POA: Diagnosis not present

## 2019-06-08 DIAGNOSIS — I10 Essential (primary) hypertension: Secondary | ICD-10-CM | POA: Diagnosis not present

## 2019-06-08 DIAGNOSIS — Z5189 Encounter for other specified aftercare: Secondary | ICD-10-CM | POA: Diagnosis not present

## 2019-06-08 DIAGNOSIS — L0889 Other specified local infections of the skin and subcutaneous tissue: Secondary | ICD-10-CM | POA: Diagnosis not present

## 2019-06-16 DIAGNOSIS — L0889 Other specified local infections of the skin and subcutaneous tissue: Secondary | ICD-10-CM | POA: Diagnosis not present

## 2019-06-16 DIAGNOSIS — Z5189 Encounter for other specified aftercare: Secondary | ICD-10-CM | POA: Diagnosis not present

## 2019-06-16 DIAGNOSIS — S81801S Unspecified open wound, right lower leg, sequela: Secondary | ICD-10-CM | POA: Diagnosis not present

## 2019-06-30 DIAGNOSIS — N289 Disorder of kidney and ureter, unspecified: Secondary | ICD-10-CM | POA: Diagnosis not present

## 2019-06-30 DIAGNOSIS — Z23 Encounter for immunization: Secondary | ICD-10-CM | POA: Diagnosis not present

## 2019-06-30 DIAGNOSIS — M25561 Pain in right knee: Secondary | ICD-10-CM | POA: Diagnosis not present

## 2019-06-30 DIAGNOSIS — M8580 Other specified disorders of bone density and structure, unspecified site: Secondary | ICD-10-CM | POA: Diagnosis not present

## 2019-06-30 DIAGNOSIS — R7301 Impaired fasting glucose: Secondary | ICD-10-CM | POA: Diagnosis not present

## 2019-06-30 DIAGNOSIS — Z0000 Encounter for general adult medical examination without abnormal findings: Secondary | ICD-10-CM | POA: Diagnosis not present

## 2019-06-30 DIAGNOSIS — K5730 Diverticulosis of large intestine without perforation or abscess without bleeding: Secondary | ICD-10-CM | POA: Diagnosis not present

## 2019-06-30 DIAGNOSIS — F4320 Adjustment disorder, unspecified: Secondary | ICD-10-CM | POA: Diagnosis not present

## 2019-06-30 DIAGNOSIS — F329 Major depressive disorder, single episode, unspecified: Secondary | ICD-10-CM | POA: Diagnosis not present

## 2019-06-30 DIAGNOSIS — M5136 Other intervertebral disc degeneration, lumbar region: Secondary | ICD-10-CM | POA: Diagnosis not present

## 2019-06-30 DIAGNOSIS — Z1331 Encounter for screening for depression: Secondary | ICD-10-CM | POA: Diagnosis not present

## 2019-06-30 DIAGNOSIS — I10 Essential (primary) hypertension: Secondary | ICD-10-CM | POA: Diagnosis not present

## 2019-06-30 DIAGNOSIS — D126 Benign neoplasm of colon, unspecified: Secondary | ICD-10-CM | POA: Diagnosis not present

## 2019-07-06 ENCOUNTER — Other Ambulatory Visit: Payer: Self-pay | Admitting: Internal Medicine

## 2019-08-11 ENCOUNTER — Other Ambulatory Visit: Payer: Self-pay | Admitting: Internal Medicine

## 2019-08-11 DIAGNOSIS — Z1231 Encounter for screening mammogram for malignant neoplasm of breast: Secondary | ICD-10-CM

## 2019-10-02 ENCOUNTER — Other Ambulatory Visit: Payer: Self-pay | Admitting: Internal Medicine

## 2019-10-04 ENCOUNTER — Ambulatory Visit: Payer: Medicare Other

## 2019-10-20 ENCOUNTER — Other Ambulatory Visit (HOSPITAL_COMMUNITY): Payer: Self-pay | Admitting: Internal Medicine

## 2019-10-20 ENCOUNTER — Other Ambulatory Visit: Payer: Self-pay | Admitting: Internal Medicine

## 2019-10-20 ENCOUNTER — Ambulatory Visit (HOSPITAL_COMMUNITY)
Admission: RE | Admit: 2019-10-20 | Discharge: 2019-10-20 | Disposition: A | Discharge status: Home / Self Care | Payer: Medicare Other | Source: Ambulatory Visit | Attending: Internal Medicine | Admitting: Internal Medicine

## 2019-10-20 ENCOUNTER — Other Ambulatory Visit: Payer: Self-pay

## 2019-10-20 DIAGNOSIS — R410 Disorientation, unspecified: Secondary | ICD-10-CM

## 2019-10-20 DIAGNOSIS — R413 Other amnesia: Secondary | ICD-10-CM

## 2019-10-20 LAB — POCT I-STAT CREATININE: Creatinine, Ser: 1 mg/dL (ref 0.44–1.00)

## 2019-10-20 MED ORDER — GADOBUTROL 1 MMOL/ML IV SOLN
7.0000 mL | Freq: Once | INTRAVENOUS | Status: AC | PRN
  Administered 2019-10-20: 7 mL via INTRAVENOUS

## 2019-10-22 DIAGNOSIS — H8110 Benign paroxysmal vertigo, unspecified ear: Secondary | ICD-10-CM

## 2019-10-22 HISTORY — DX: Benign paroxysmal vertigo, unspecified ear: H81.10

## 2019-10-28 ENCOUNTER — Ambulatory Visit (INDEPENDENT_AMBULATORY_CARE_PROVIDER_SITE_OTHER): Payer: Medicare Other | Admitting: Neurology

## 2019-10-28 ENCOUNTER — Encounter: Payer: Self-pay | Admitting: *Deleted

## 2019-10-28 ENCOUNTER — Other Ambulatory Visit: Payer: Self-pay

## 2019-10-28 ENCOUNTER — Encounter: Payer: Self-pay | Admitting: Neurology

## 2019-10-28 VITALS — BP 127/79 | HR 85 | Temp 97.6°F | Ht 65.0 in | Wt 161.0 lb

## 2019-10-28 DIAGNOSIS — F411 Generalized anxiety disorder: Secondary | ICD-10-CM

## 2019-10-28 DIAGNOSIS — R413 Other amnesia: Secondary | ICD-10-CM | POA: Diagnosis not present

## 2019-10-28 MED ORDER — DONEPEZIL HCL 5 MG PO TABS: 5.0000 mg | ORAL_TABLET | Freq: Every day | ORAL | 2 refills | Status: DC

## 2019-10-28 MED ORDER — GABAPENTIN 300 MG PO CAPS: 300.0000 mg | ORAL_CAPSULE | Freq: Every day | ORAL | 3 refills | Status: DC

## 2019-10-28 NOTE — Progress Notes (Signed)
[facility]    Provider:  Dr Jaynee Eagles Requesting Provider: Crist Infante, MD Primary Care Provider:  Crist Infante, MD  CC:  Memory changes  HPI:  Colleen Glenn is a 79 y.o. female here as requested by Crist Infante, MD for memory loss, frequent falls, confusion. PMHx tremor, panic disorder, mild hypertension, hyperlipidemia, depression, benign paroxysmal positional vertigo, back pain, anxiety.  I reviewed Dr. Tempie Donning notes.  She is having short-term memory issues, she is also had multiple falls out of bed at night but does not follow any other time, has not fallen at all since moving in with daughter and is now sleeping in a lowered bed.  Patient presented with confusion, memory loss, frequent falls and worsening depression since her husband of 51 years died the end of 06-Jun-2023.  In 11/06/2019 she fell out of bed in the middle of the night approximately 3 nights, fall did not wake her up, she awakened on the floor with confusion and unaware how long she had been there, bruising present.  Memory is worse and described as forgetful.  She does not want to eat because of her grief daughters forcing her, 2 glasses of wine a few nights a week but not on the nights of the falls, denies facial asymmetry, slurred speech, unilateral weakness, sensation deficits.  I also reviewed his examination which was normal for ears nose throat, neck, respiratory, cardiovascular, GI, skin and limited neurologic exam.  She was sent here for evaluation.  A UA was checked.  MRI of the brain was checked.  I personally reviewed images and agree with radiology report that there is moderate generalized cerebral atrophy as well as mild chronic small vessel ischemic changes.  She is falling out of the bed. She has opened the door to her home and she has fallen. She has taken some "rough falls" per daughter. She loses her balance. She started falling after her left hip replacement. Her mother fell a lot. Her daughter  falls a lot. Her mother did not have parkinson's disease, or muscle disease, just also lost balance. Daughter provides lots of information, she also trips a lot, daughter has broken her ankles. She also needs knee replacement, her knee is "bone on bone". She has been very depressed since her husband of many years died, she is in a lot of pain, he passed away end of 06-06-2023. The knee pain affects her balance. NO neuropathy in the family. Patient denies numbness and tingling in her feet, she had morton neuromas in her toes and she had surgery on the foot. Patient says she has a lot of shaking, it comes and goes, shaking is positional and with action. She sleeps on the edge of her bed and the last time she was struggling with someone in her sleep had vivid dreams. The last fall she had a vivid dream. She has neck pain and low back pain. The top of her back gives her a fit in the neck area and between the shoulders, daughter says she is very weak generalized though. No dementia in the family as far as they know. Daughter may think she shuffles sometimes but it may be knees. No other focal neurologic deficits, associated symptoms, inciting events or modifiable factors.  Review of Systems: Patient complains of symptoms per HPI as well as the following symptoms: significant anxiety. Pertinent negatives and positives per HPI. All others negative.   Social History   Socioeconomic History  . Marital status: Widowed  Spouse name: Not on file  . Number of children: 3  . Years of education: cosmetology degree  . Highest education level: GED or equivalent  Occupational History  . Occupation: Retired  Tobacco Use  . Smoking status: Former Research scientist (life sciences)  . Smokeless tobacco: Never Used  . Tobacco comment: 45 years ago  Substance and Sexual Activity  . Alcohol use: Yes    Alcohol/week: 2.0 - 4.0 standard drinks    Types: 2 - 4 Glasses of wine per week    Comment: occassional  . Drug use: No  . Sexual activity: Not  Currently    Partners: Male  Other Topics Concern  . Not on file  Social History Narrative   Lives alone   Right handed   Social Determinants of Health   Financial Resource Strain:   . Difficulty of Paying Living Expenses: Not on file  Food Insecurity:   . Worried About Charity fundraiser in the Last Year: Not on file  . Ran Out of Food in the Last Year: Not on file  Transportation Needs:   . Lack of Transportation (Medical): Not on file  . Lack of Transportation (Non-Medical): Not on file  Physical Activity:   . Days of Exercise per Week: Not on file  . Minutes of Exercise per Session: Not on file  Stress:   . Feeling of Stress : Not on file  Social Connections:   . Frequency of Communication with Friends and Family: Not on file  . Frequency of Social Gatherings with Friends and Family: Not on file  . Attends Religious Services: Not on file  . Active Member of Clubs or Organizations: Not on file  . Attends Archivist Meetings: Not on file  . Marital Status: Not on file  Intimate Partner Violence:   . Fear of Current or Ex-Partner: Not on file  . Emotionally Abused: Not on file  . Physically Abused: Not on file  . Sexually Abused: Not on file    Family History  Problem Relation Age of Onset  . Colon polyps Mother   . Stroke Mother   . Stroke Other        several family members on mother's side  . Colon cancer Neg Hx   . Rectal cancer Neg Hx   . Stomach cancer Neg Hx     Past Medical History:  Diagnosis Date  . Anxiety   . Arthritis   . Back pain   . BPPV (benign paroxysmal positional vertigo)   . Bursitis   . Colon polyps    adenomatous  . Colon polyps   . Depression   . Diverticulosis   . GERD (gastroesophageal reflux disease)   . Hemorrhoids   . Hyperlipidemia   . Mild hypertension    white coat syndrome  . OAB (overactive bladder)   . Osteopenia   . Panic disorder 11/2012  . Plantar fasciitis    hx of  . PUD (peptic ulcer disease)     . Shingles   . Tremor    mild action tremor suspected 2020  . Vitamin D deficiency     Patient Active Problem List   Diagnosis Date Noted  . GAD (generalized anxiety disorder) 10/31/2019  . Memory loss 10/31/2019  . Postop Hyponatremia 11/18/2012  . Postop Acute blood loss anemia 11/18/2012  . OA (osteoarthritis) of hip 11/16/2012  . NAUSEA 08/01/2009  . HEMORRHOIDS-INTERNAL 07/31/2009  . HEMORRHOIDS-EXTERNAL 07/31/2009  . DIVERTICULOSIS-COLON 07/31/2009  . RECTAL BLEEDING  07/31/2009  . NAUSEA WITH VOMITING 07/31/2009  . Abdominal pain, epigastric 07/31/2009  . PERSONAL HX COLONIC POLYPS 07/31/2009    Past Surgical History:  Procedure Laterality Date  . ABDOMINAL HYSTERECTOMY    . APPENDECTOMY    . BACK SURGERY    . COLONOSCOPY    . FOOT SURGERY     bilateral   . HEMORRHOID SURGERY    . laproscopic knee surgery Right    2018 or 2019  . LUMBAR LAMINECTOMY/DECOMPRESSION MICRODISCECTOMY  05/07/2012   Procedure: LUMBAR LAMINECTOMY/DECOMPRESSION MICRODISCECTOMY 1 LEVEL;  Surgeon: Eustace Moore, MD;  Location: [facility];  Service: Neurosurgery;  Laterality: Right;  Lumbar Laminectomy Decompression Microdiscectomy Lumbar Three-Four  . multiple foot surgeries    . POLYPECTOMY    . ROTATOR CUFF REPAIR     right side  . TONSILLECTOMY    . TOTAL HIP ARTHROPLASTY  11/16/2012   Procedure: TOTAL HIP ARTHROPLASTY;  Surgeon: Gearlean Alf, MD;  Location: [facility];  Service: Orthopedics;  Laterality: Right;  Right Total Hip Arthroplasty  . TUBAL LIGATION    . VAGOTOMY     approx. 35 years ago, states has a clamp mid epigastric area    Current Outpatient Medications  Medication Sig Dispense Refill  . Biotin 5000 MCG TABS Take 10,000 mcg by mouth daily.    Marland Kitchen buPROPion (WELLBUTRIN XL) 300 MG 24 hr tablet Take 300 mg by mouth daily before breakfast.     . Cyanocobalamin (VITAMIN B 12 PO) Take 1,000 mg by mouth daily.    . cyclobenzaprine (FLEXERIL) 10 MG tablet Take 1 tablet (10 mg  total) by mouth 3 (three) times daily as needed for muscle spasms. 80 tablet 0  . docusate sodium (STOOL SOFTENER) 100 MG capsule Take 200 mg by mouth every other day.    Marland Kitchen omeprazole (PRILOSEC) 40 MG capsule TAKE 1 CAPSULE BY MOUTH EVERY DAY; patient needs office visit for further refills 90 capsule 0  . polycarbophil (FIBERCON) 625 MG tablet Take 2,500 mg by mouth at bedtime.     . raloxifene (EVISTA) 60 MG tablet Take 60 mg by mouth daily.    . Sennosides (SENOKOT PO) Take by mouth.    . simvastatin (ZOCOR) 40 MG tablet Take 20 mg by mouth at bedtime.    . triamterene-hydrochlorothiazide (MAXZIDE-25) 37.5-25 MG per tablet Take 1 tablet by mouth daily before breakfast.     . Vitamin D, Ergocalciferol, (DRISDOL) 50000 UNITS CAPS capsule Take 50,000 Units by mouth every 7 (seven) days.    Marland Kitchen donepezil (ARICEPT) 5 MG tablet Take 1 tablet (5 mg total) by mouth at bedtime. 30 tablet 2  . gabapentin (NEURONTIN) 300 MG capsule Take 1 capsule (300 mg total) by mouth at bedtime. 30 capsule 3   No current facility-administered medications for this visit.    Allergies as of 10/28/2019  . (No Known Allergies)    Vitals: BP 127/79 (BP Location: Right Arm, Patient Position: Sitting)   Pulse 85   Temp 97.6 F (36.4 C) Comment: guest 97.8; taken at front  Ht 5\' 5"  (1.651 m)   Wt 161 lb (73 kg)   BMI 26.79 kg/m  Last Weight:  Wt Readings from Last 1 Encounters:  10/28/19 161 lb (73 kg)   Last Height:   Ht Readings from Last 1 Encounters:  10/28/19 5\' 5"  (1.651 m)     Physical exam: Exam: Gen: NAD, conversant, well nourised, well groomed  CV: RRR, no MRG. No Carotid Bruits. No peripheral edema, warm, nontender Eyes: Conjunctivae clear without exudates or hemorrhage  Neuro: Detailed Neurologic Exam  Speech:    Speech is normal; fluent and spontaneous with normal comprehension.  Cognition:  MMSE - Mini Mental State Exam 10/28/2019  Orientation to time 4  Orientation  to Place 5  Registration 3  Attention/ Calculation 1  Recall 1  Language- name 2 objects 0  Language- repeat 0  Language- follow 3 step command 2  Language- read & follow direction 1  Write a sentence 1  Copy design 1  Total score 19      Cranial Nerves:    The pupils are equal, round, and reactive to light. Attempted fundoscopy could not visualize due to small pupils. Visual fields are full to finger confrontation. Extraocular movements are intact. Trigeminal sensation is intact and the muscles of mastication are normal. The face is symmetric. The palate elevates in the midline. Hearing intact. Voice is normal. Shoulder shrug is normal. The tongue has normal motion without fasciculations.   Coordination:    Normal finger to nose and heel to shin.  Gait:    antalgic  Motor Observation:    No asymmetry, no atrophy, and no involuntary movements noted. Tone:    Normal muscle tone.    Posture:    Posture is normal. normal erect    Strength:    Strength is V/V in the upper and lower limbs.      Sensation: intact to LT     Reflex Exam:  DTR's:    Deep tendon reflexes in the upper and lower extremities are symmetrical bilaterally.   Toes:    The toes are equiv bilaterally.   Clonus:    Clonus is absent.    Assessment/Plan:  Really lovely mother and daughter who are here for memory loss. MMSE 19/30. I had a very long discussion about cognitive impairment vs dementia, types of dementia, how anxiety may affect her. I offered a thorough evaluation incuding labs, MRIs brain/c-spine, formal memory testing. Patient declined, she is extremely anxious and declines anything further except starting some Aricept and blood work. She is not parkinsonian on exam. I did discuss treating her anxiety and encouraged her to follow u with psychiatry and/or talk therapy. She says that she is so anxious that if she makes a follow up appointment with me to follow clinically that she will worry about  it. No follow up but they are welcome to come back at any time.  Daughter can email [place] to increase donepezil and I am always available for a follow up should they ever want to come back. They requested gabapentin, she was on it in the past, helped her sleep, she has a lot of pain at night. Unclear how much her anxiety interfered with her MMSE (19/30).   Orders Placed This Encounter  Procedures  . B12 and Folate Panel  . Methylmalonic Acid, Serum  . Homocysteine   Meds ordered this encounter  Medications  . donepezil (ARICEPT) 5 MG tablet    Sig: Take 1 tablet (5 mg total) by mouth at bedtime.    Dispense:  30 tablet    Refill:  2  . gabapentin (NEURONTIN) 300 MG capsule    Sig: Take 1 capsule (300 mg total) by mouth at bedtime.    Dispense:  30 capsule    Refill:  3    Cc: Crist Infante, MD   Sarina Ill, MD  [facility] [address] [place], [place] [address]  Phone [phone] Fax [phone]  A total of 60 minutes was spent face-to-face with this patient. Over half this time was spent on counseling patient on the  1. Memory loss   2. GAD (generalized anxiety disorder)    diagnosis and different diagnostic and therapeutic options, counseling and coordination of care, risks ans benefits of management, compliance, or risk factor reduction and education.

## 2019-10-28 NOTE — Patient Instructions (Addendum)
Start Donepezil Email me in 30 days and I can increase the Donepezil if doing well Memantine is another medication to consider at a later time Call for follow up appointment Blood work Start Gabapentin at bedtime  Gabapentin capsules or tablets What is this medicine? GABAPENTIN (GA ba pen tin) is used to control seizures in certain types of epilepsy. It is also used to treat certain types of nerve pain. This medicine may be used for other purposes; ask your health care provider or pharmacist if you have questions. COMMON BRAND NAME(S): Active-PAC with Gabapentin, Gabarone, Neurontin What should I tell my health care provider before I take this medicine? They need to know if you have any of these conditions:  history of drug abuse or alcohol abuse problem  kidney disease  lung or breathing disease  suicidal thoughts, plans, or attempt; a previous suicide attempt by you or a family member  an unusual or allergic reaction to gabapentin, other medicines, foods, dyes, or preservatives  pregnant or trying to get pregnant  breast-feeding How should I use this medicine? Take this medicine by mouth with a glass of water. Follow the directions on the prescription label. You can take it with or without food. If it upsets your stomach, take it with food. Take your medicine at regular intervals. Do not take it more often than directed. Do not stop taking except on your doctor's advice. If you are directed to break the 600 or 800 mg tablets in half as part of your dose, the extra half tablet should be used for the next dose. If you have not used the extra half tablet within 28 days, it should be thrown away. A special MedGuide will be given to you by the pharmacist with each prescription and refill. Be sure to read this information carefully each time. Talk to your pediatrician regarding the use of this medicine in children. While this drug may be prescribed for children as young as 3 years for  selected conditions, precautions do apply. Overdosage: If you think you have taken too much of this medicine contact a poison control center or emergency room at once. NOTE: This medicine is only for you. Do not share this medicine with others. What if I miss a dose? If you miss a dose, take it as soon as you can. If it is almost time for your next dose, take only that dose. Do not take double or extra doses. What may interact with this medicine? This medicine may interact with the following medications:  alcohol  antihistamines for allergy, cough, and cold  certain medicines for anxiety or sleep  certain medicines for depression like amitriptyline, fluoxetine, sertraline  certain medicines for seizures like phenobarbital, primidone  certain medicines for stomach problems  general anesthetics like halothane, isoflurane, methoxyflurane, propofol  local anesthetics like lidocaine, pramoxine, tetracaine  medicines that relax muscles for surgery  narcotic medicines for pain  phenothiazines like chlorpromazine, mesoridazine, prochlorperazine, thioridazine This list may not describe all possible interactions. Give your health care provider a list of all the medicines, herbs, non-prescription drugs, or dietary supplements you use. Also tell them if you smoke, drink alcohol, or use illegal drugs. Some items may interact with your medicine. What should I watch for while using this medicine? Visit your doctor or health care provider for regular checks on your progress. You may want to keep a record at home of how you feel your condition is responding to treatment. You may want to share this  information with your doctor or health care provider at each visit. You should contact your doctor or health care provider if your seizures get worse or if you have any new types of seizures. Do not stop taking this medicine or any of your seizure medicines unless instructed by your doctor or health care  provider. Stopping your medicine suddenly can increase your seizures or their severity. This medicine may cause serious skin reactions. They can happen weeks to months after starting the medicine. Contact your health care provider right away if you notice fevers or flu-like symptoms with a rash. The rash may be red or purple and then turn into blisters or peeling of the skin. Or, you might notice a red rash with swelling of the face, lips or lymph nodes in your neck or under your arms. Wear a medical identification bracelet or chain if you are taking this medicine for seizures, and carry a card that lists all your medications. You may get drowsy, dizzy, or have blurred vision. Do not drive, use machinery, or do anything that needs mental alertness until you know how this medicine affects you. To reduce dizzy or fainting spells, do not sit or stand up quickly, especially if you are an older patient. Alcohol can increase drowsiness and dizziness. Avoid alcoholic drinks. Your mouth may get dry. Chewing sugarless gum or sucking hard candy, and drinking plenty of water will help. The use of this medicine may increase the chance of suicidal thoughts or actions. Pay special attention to how you are responding while on this medicine. Any worsening of mood, or thoughts of suicide or dying should be reported to your health care provider right away. Women who become pregnant while using this medicine may enroll in the [place] Pregnancy Registry by calling [phone]. This registry collects information about the safety of antiepileptic drug use during pregnancy. What side effects may I notice from receiving this medicine? Side effects that you should report to your doctor or health care professional as soon as possible:  allergic reactions like skin rash, itching or hives, swelling of the face, lips, or tongue  breathing problems  rash, fever, and swollen lymph nodes  redness,  blistering, peeling or loosening of the skin, including inside the mouth  suicidal thoughts, mood changes Side effects that usually do not require medical attention (report to your doctor or health care professional if they continue or are bothersome):  dizziness  drowsiness  headache  nausea, vomiting  swelling of ankles, feet, hands  tiredness This list may not describe all possible side effects. Call your doctor for medical advice about side effects. You may report side effects to FDA at 1-800-FDA-1088. Where should I keep my medicine? Keep out of reach of children. This medicine may cause accidental overdose and death if it taken by other adults, children, or pets. Mix any unused medicine with a substance like cat litter or coffee grounds. Then throw the medicine away in a sealed container like a sealed bag or a coffee can with a lid. Do not use the medicine after the expiration date. Store at room temperature between 15 and 30 degrees C (59 and 86 degrees F). NOTE: This sheet is a summary. It may not cover all possible information. If you have questions about this medicine, talk to your doctor, pharmacist, or health care provider.  2020 Elsevier/Gold Standard (2019-01-08 14:16:43)   Donepezil Oral Dissolving Tablet What is this medicine? DONEPEZIL (doe NEP e zil) is used to  treat mild to moderate dementia caused by Alzheimer's disease. This medicine may be used for other purposes; ask your health care provider or pharmacist if you have questions. COMMON BRAND NAME(S): Aricept What should I tell my health care provider before I take this medicine? They need to know if you have any of these conditions:  asthma or other lung disease  difficulty passing urine  head injury  heart disease  history of irregular heartbeat  liver disease  seizures (convulsions)  stomach or intestinal disease, ulcers or stomach bleeding  an unusual or allergic reaction to donepezil, other  medicines, foods, dyes, or preservatives  pregnant or trying to get pregnant  breast-feeding How should I use this medicine? Take this medicine by mouth. Follow the directions on the prescription label. Place the tablet in the mouth and allow it to dissolve, then swallow. While you may take these tablets with water, it is not necessary to do so. You may take this medicine with or without food. Take your doses at regular intervals. This medicine is usually taken before bedtime. Do not take your medicine more often than directed. Continue to take your medicine even if you feel better. Do not stop taking except on the advice of your doctor or health care professional. Talk to your pediatrician regarding the use of this medicine in children. Special care may be needed. Overdosage: If you think you have taken too much of this medicine contact a poison control center or emergency room at once. NOTE: This medicine is only for you. Do not share this medicine with others. What if I miss a dose? If you miss a dose, take it as soon as you can. If it is almost time for your next dose, take only that dose. Do not take double or extra doses. What may interact with this medicine? Do not take this medicine with any of the following medications:  certain medicines for fungal infections like itraconazole, fluconazole, posaconazole, and voriconazole  cisapride  dextromethorphan; quinidine  dronedarone  pimozide  quinidine  thioridazine This medicine may also interact with the following medications:  antihistamines for allergy, cough and cold  atropine  bethanechol  carbamazepine  certain medicines for bladder problems like oxybutynin, tolterodine  certain medicines for Parkinson's disease like benztropine, trihexyphenidyl  certain medicines for stomach problems like dicyclomine, hyoscyamine  certain medicines for travel sickness like  scopolamine  dexamethasone  dofetilide  ipratropium  NSAIDs, medicines for pain and inflammation, like ibuprofen or naproxen  other medicines for Alzheimer's disease  other medicines that prolong the QT interval (cause an abnormal heart rhythm)  phenobarbital  phenytoin  rifampin, rifabutin or rifapentine  ziprasidone This list may not describe all possible interactions. Give your health care provider a list of all the medicines, herbs, non-prescription drugs, or dietary supplements you use. Also tell them if you smoke, drink alcohol, or use illegal drugs. Some items may interact with your medicine. What should I watch for while using this medicine? Visit your doctor or health care professional for regular checks on your progress. Check with your doctor or health care professional if your symptoms do not get better or if they get worse. You may get drowsy or dizzy. Do not drive, use machinery, or do anything that needs mental alertness until you know how this drug affects you. What side effects may I notice from receiving this medicine? Side effects that you should report to your doctor or health care professional as soon as possible:  allergic reactions  like skin rash, itching or hives, swelling of the face, lips, or tongue  feeling faint or lightheaded, falls  loss of bladder control  seizures  signs and symptoms of a dangerous change in heartbeat or heart rhythm like chest pain; dizziness; fast or irregular heartbeat; palpitations; feeling faint or lightheaded, falls; breathing problems  signs and symptoms of infection like fever or chills; cough; sore throat; pain or trouble passing urine  signs and symptoms of liver injury like dark yellow or brown urine; general ill feeling or flu-like symptoms; light-colored stools; loss of appetite; nausea; right upper belly pain; unusually weak or tired; yellowing of the eyes or skin  slow heartbeat or palpitations  unusual  bleeding or bruising  vomiting Side effects that usually do not require medical attention (report to your doctor or health care professional if they continue or are bothersome):  diarrhea, especially when starting treatment  headache  loss of appetite  muscle cramps  nausea  stomach upset This list may not describe all possible side effects. Call your doctor for medical advice about side effects. You may report side effects to FDA at 1-800-FDA-1088. Where should I keep my medicine? Keep out of reach of children. Store at room temperature between 15 and 30 degrees C (59 and 86 degrees F). Throw away any unused medicine after the expiration date. NOTE: This sheet is a summary. It may not cover all possible information. If you have questions about this medicine, talk to your doctor, pharmacist, or health care provider.  2020 Elsevier/Gold Standard (2018-09-28 10:24:00)

## 2019-10-31 ENCOUNTER — Encounter: Payer: Self-pay | Admitting: Neurology

## 2019-10-31 DIAGNOSIS — R413 Other amnesia: Secondary | ICD-10-CM | POA: Insufficient documentation

## 2019-10-31 DIAGNOSIS — F411 Generalized anxiety disorder: Secondary | ICD-10-CM | POA: Insufficient documentation

## 2019-11-02 ENCOUNTER — Telehealth: Payer: Self-pay | Admitting: *Deleted

## 2019-11-02 LAB — B12 AND FOLATE PANEL
Folate: 11.5 ng/mL (ref >3.0)
Vitamin B-12: 433 pg/mL (ref 232–1245)

## 2019-11-02 LAB — METHYLMALONIC ACID, SERUM: Methylmalonic Acid: 204 nmol/L (ref 0–378)

## 2019-11-02 LAB — HOMOCYSTEINE: Homocysteine: 8.9 umol/L (ref 0.0–19.2)

## 2019-11-02 NOTE — Telephone Encounter (Signed)
Spoke with pt and advised labs are normal. She verbalized understanding and appreciation for the call.

## 2019-11-03 ENCOUNTER — Ambulatory Visit
Admission: RE | Admit: 2019-11-03 | Discharge: 2019-11-03 | Disposition: A | Discharge status: Home / Self Care | Payer: Medicare Other | Source: Ambulatory Visit | Attending: Internal Medicine | Admitting: Internal Medicine

## 2019-11-03 ENCOUNTER — Other Ambulatory Visit: Payer: Self-pay

## 2019-11-03 DIAGNOSIS — Z1231 Encounter for screening mammogram for malignant neoplasm of breast: Secondary | ICD-10-CM

## 2019-11-17 ENCOUNTER — Ambulatory Visit: Payer: Medicare Other

## 2019-11-27 ENCOUNTER — Ambulatory Visit: Payer: Medicare Other | Attending: Internal Medicine

## 2019-11-27 DIAGNOSIS — Z23 Encounter for immunization: Secondary | ICD-10-CM | POA: Insufficient documentation

## 2019-11-27 NOTE — Progress Notes (Signed)
   Covid-19 Vaccination Clinic  Name:  Colleen Glenn    MRN: [id number] DOB: 02-01-1941  11/27/2019  [name] was observed post Covid-19 immunization for 15 minutes without incidence. She was provided with Vaccine Information Sheet and instruction to access the V-Safe system.   [name] was instructed to call 911 with any severe reactions post vaccine: Marland Kitchen Difficulty breathing  . Swelling of your face and throat  . A fast heartbeat  . A bad rash all over your body  . Dizziness and weakness    Immunizations Administered    Name Date Dose VIS Date Route   Pfizer COVID-19 Vaccine 11/27/2019  9:53 AM 0.3 mL 10/01/2019 Intramuscular   Manufacturer: [place]   Lot: [id number]   [place]: [id number]

## 2019-12-22 ENCOUNTER — Ambulatory Visit: Payer: Medicare Other | Attending: Internal Medicine

## 2019-12-22 DIAGNOSIS — Z23 Encounter for immunization: Secondary | ICD-10-CM | POA: Insufficient documentation

## 2019-12-22 NOTE — Progress Notes (Signed)
   Covid-19 Vaccination Clinic  Name:  Colleen Glenn    MRN: [id number] DOB: 22-Jul-1941  12/22/2019  [name] was observed post Covid-19 immunization for 15 minutes without incident. She was provided with Vaccine Information Sheet and instruction to access the V-Safe system.   [name] was instructed to call 911 with any severe reactions post vaccine: Marland Kitchen Difficulty breathing  . Swelling of face and throat  . A fast heartbeat  . A bad rash all over body  . Dizziness and weakness   Immunizations Administered    Name Date Dose VIS Date Route   Pfizer COVID-19 Vaccine 12/22/2019  8:33 AM 0.3 mL 10/01/2019 Intramuscular   Manufacturer: [place]   Lot: [id number]   [place]: [id number]

## 2019-12-28 DIAGNOSIS — R410 Disorientation, unspecified: Secondary | ICD-10-CM | POA: Diagnosis not present

## 2019-12-28 DIAGNOSIS — K8020 Calculus of gallbladder without cholecystitis without obstruction: Secondary | ICD-10-CM | POA: Diagnosis not present

## 2019-12-28 DIAGNOSIS — M25561 Pain in right knee: Secondary | ICD-10-CM | POA: Diagnosis not present

## 2019-12-28 DIAGNOSIS — R413 Other amnesia: Secondary | ICD-10-CM | POA: Diagnosis not present

## 2019-12-28 DIAGNOSIS — R7301 Impaired fasting glucose: Secondary | ICD-10-CM | POA: Diagnosis not present

## 2019-12-28 DIAGNOSIS — R296 Repeated falls: Secondary | ICD-10-CM | POA: Diagnosis not present

## 2019-12-28 DIAGNOSIS — M5136 Other intervertebral disc degeneration, lumbar region: Secondary | ICD-10-CM | POA: Diagnosis not present

## 2019-12-28 DIAGNOSIS — N289 Disorder of kidney and ureter, unspecified: Secondary | ICD-10-CM | POA: Diagnosis not present

## 2019-12-28 DIAGNOSIS — E538 Deficiency of other specified B group vitamins: Secondary | ICD-10-CM | POA: Diagnosis not present

## 2019-12-28 DIAGNOSIS — F4320 Adjustment disorder, unspecified: Secondary | ICD-10-CM | POA: Diagnosis not present

## 2019-12-28 DIAGNOSIS — F329 Major depressive disorder, single episode, unspecified: Secondary | ICD-10-CM | POA: Diagnosis not present

## 2019-12-31 DIAGNOSIS — M2341 Loose body in knee, right knee: Secondary | ICD-10-CM | POA: Diagnosis not present

## 2020-01-09 ENCOUNTER — Other Ambulatory Visit: Payer: Self-pay | Admitting: Internal Medicine

## 2020-01-12 ENCOUNTER — Encounter: Payer: Self-pay | Admitting: Cardiology

## 2020-01-12 ENCOUNTER — Other Ambulatory Visit: Payer: Self-pay

## 2020-01-12 ENCOUNTER — Ambulatory Visit: Payer: Medicare Other | Admitting: Cardiology

## 2020-01-12 VITALS — BP 127/75 | HR 95 | Temp 98.0°F | Resp 17 | Ht 63.0 in | Wt 160.0 lb

## 2020-01-12 DIAGNOSIS — I10 Essential (primary) hypertension: Secondary | ICD-10-CM

## 2020-01-12 DIAGNOSIS — E7800 Pure hypercholesterolemia, unspecified: Secondary | ICD-10-CM

## 2020-01-12 DIAGNOSIS — Z01818 Encounter for other preprocedural examination: Secondary | ICD-10-CM

## 2020-01-12 NOTE — Progress Notes (Signed)
Primary Physician/Referring:  Crist Infante, MD  Patient ID: Colleen Glenn, female    DOB: 16-Mar-1941, 79 y.o.   MRN: [id number]  Chief Complaint  Patient presents with  . Medical Clearance  . New Patient (Initial Visit)   HPI:    Colleen Glenn  is a 79 y.o. hypertension, hyperlipidemia, anxiety, mild memory loss, referred to me for preoperative cardiovascular stratification, patient undergoing right total knee replacement.  She is otherwise essentially asymptomatic but has reduced her physical activity due to severe arthritis especially in her right knee worse than the left.  She denies chest pain, palpitations, dizziness or syncope.  Past Medical History:  Diagnosis Date  . Anxiety   . Arthritis   . Back pain   . BPPV (benign paroxysmal positional vertigo)   . Bursitis   . Colon polyps    adenomatous  . Colon polyps   . Depression   . Diverticulosis   . GERD (gastroesophageal reflux disease)   . Hemorrhoids   . Hyperlipidemia   . Mild hypertension    white coat syndrome  . OAB (overactive bladder)   . Osteopenia   . Panic disorder 11/2012  . Plantar fasciitis    hx of  . PUD (peptic ulcer disease)   . Shingles   . Tremor    mild action tremor suspected 2020  . Vitamin D deficiency    Past Surgical History:  Procedure Laterality Date  . ABDOMINAL HYSTERECTOMY    . APPENDECTOMY    . BACK SURGERY    . COLONOSCOPY    . FOOT SURGERY     bilateral   . HEMORRHOID SURGERY    . laproscopic knee surgery Right    2018 or 2019  . LUMBAR LAMINECTOMY/DECOMPRESSION MICRODISCECTOMY  05/07/2012   Procedure: LUMBAR LAMINECTOMY/DECOMPRESSION MICRODISCECTOMY 1 LEVEL;  Surgeon: Eustace Moore, MD;  Location: [facility];  Service: Neurosurgery;  Laterality: Right;  Lumbar Laminectomy Decompression Microdiscectomy Lumbar Three-Four  . multiple foot surgeries    . POLYPECTOMY    . ROTATOR CUFF REPAIR     right side  . TONSILLECTOMY    . TOTAL HIP ARTHROPLASTY   11/16/2012   Procedure: TOTAL HIP ARTHROPLASTY;  Surgeon: Gearlean Alf, MD;  Location: [facility];  Service: Orthopedics;  Laterality: Right;  Right Total Hip Arthroplasty  . TUBAL LIGATION    . VAGOTOMY     approx. 35 years ago, states has a clamp mid epigastric area   Family History  Problem Relation Age of Onset  . Colon polyps Mother   . Stroke Mother   . Stroke Other        several family members on mother's side  . Lung cancer Father   . Heart disease Father   . Colon cancer Neg Hx   . Rectal cancer Neg Hx   . Stomach cancer Neg Hx     Social History   Tobacco Use  . Smoking status: Former Smoker    Packs/day: 1.00    Years: 20.00    Pack years: 20.00    Types: Cigarettes    Quit date: 1983    Years since quitting: 38.2  . Smokeless tobacco: Never Used  . Tobacco comment: 45 years ago  Substance Use Topics  . Alcohol use: Yes    Alcohol/week: 2.0 - 4.0 standard drinks    Types: 2 - 4 Glasses of wine per week    Comment: occassional   ROS  Review of Systems  Cardiovascular: Negative for chest pain, dyspnea on exertion and leg swelling.  Musculoskeletal: Positive for joint pain (bilateral knee, left knee worse) and joint swelling (left knee).  Gastrointestinal: Negative for melena.   Objective  Blood pressure 127/75, pulse 95, temperature 98 F (36.7 C), temperature source Temporal, resp. rate 17, height '5\' 3"'$  (1.6 m), weight 160 lb (72.6 kg), SpO2 97 %.  Vitals with BMI 01/12/2020 10/28/2019 05/24/2019  Height '5\' 3"'$  '5\' 5"'$  -  Weight 160 lbs 161 lbs -  BMI 02.63 78.58 -  Systolic 850 277 412  Diastolic 75 79 78  Pulse 95 85 98     Physical Exam  Cardiovascular: Normal rate, regular rhythm, normal heart sounds and intact distal pulses. Exam reveals no gallop.  No murmur heard. No leg edema, no JVD.  Pulmonary/Chest: Effort normal and breath sounds normal.  Abdominal: Soft. Bowel sounds are normal.   Laboratory examination:   External labs:   Labs 10/20/2019:  Serum glucose 91 mg, BUN 16, creatinine 0.8, EGFR greater than 60 mL, potassium 4.3, Hb 14.1/HCT 45.2, platelets 282.     Medications and allergies  No Known Allergies   Current Outpatient Medications  Medication Instructions  . Biotin 10,000 mcg, Oral, Daily  . buPROPion (WELLBUTRIN XL) 300 mg, Daily before breakfast  . Cyanocobalamin (VITAMIN B 12 PO) 1,000 mg, Oral, Daily  . cyclobenzaprine (FLEXERIL) 10 mg, Oral, 3 times daily PRN  . docusate sodium (STOOL SOFTENER) 200 mg, Every other day  . donepezil (ARICEPT) 5 mg, Oral, Daily at bedtime  . DULoxetine (CYMBALTA) 60 mg, Oral, Daily  . escitalopram (LEXAPRO) 10 mg, Oral, Daily  . ezetimibe (ZETIA) 10 mg, Oral, Daily  . gabapentin (NEURONTIN) 300 mg, Oral, Daily at bedtime  . omeprazole (PRILOSEC) 40 MG capsule TAKE 1 CAPSULE BY MOUTH EVERY DAY; patient needs office visit for further refills  . polycarbophil (FIBERCON) 2,500 mg, Daily at bedtime  . raloxifene (EVISTA) 60 mg, Oral, Daily  . Sennosides (SENOKOT PO) Oral  . simvastatin (ZOCOR) 20 mg, Daily at bedtime  . triamterene-hydrochlorothiazide (MAXZIDE-25) 37.5-25 MG per tablet 1 tablet, Daily before breakfast  . Vitamin D (Ergocalciferol) (DRISDOL) 50,000 Units, Oral, Every 7 days   Radiology:   No results found.  Cardiac Studies:   None EKG  EKG 01/12/2020: Normal sinus rhythm at rate of 90 bpm, normal axis.  No evidence of ischemia, normal EKG.      Assessment     ICD-10-CM   1. Pre-operative clearance  Z01.818 EKG 12-Lead  2. Essential hypertension  I10   3. Pure hypercholesterolemia  E78.00      No orders of the defined types were placed in this encounter.   There are no discontinued medications.  Recommendations:   Colleen Glenn  is a 79 y.o. hypertension, hyperlipidemia, anxiety, mild memory loss, referred to me for preoperative cardiovascular stratification, patient undergoing right total knee replacement.   Physical examination is  unremarkable with a normal EKG and normal vascular examination as well.  From cardiac standpoint she does not need any further cardiac work-up, I will send preoperative risk calcification is low and send a note to her surgeon Dr. Edmonia Lynch.  I will see her back on a as needed basis.  I reviewed her labs, renal function is normal, lipids are being managed by her PCP.  Blood pressure is well controlled today.  Also discussed with patient and her daughter regarding overall low risk but that does not exclude potential complication that  can happen perioperatively but I will certainly be available if cardiac issues were to arise.  They are in agreement and wished to proceed.  Adrian Prows, MD, [place] 01/12/2020, 3:18 PM [facility]. [place] Office: [phone]

## 2020-01-17 DIAGNOSIS — R2689 Other abnormalities of gait and mobility: Secondary | ICD-10-CM | POA: Diagnosis not present

## 2020-01-17 DIAGNOSIS — K8020 Calculus of gallbladder without cholecystitis without obstruction: Secondary | ICD-10-CM | POA: Diagnosis not present

## 2020-01-17 DIAGNOSIS — F329 Major depressive disorder, single episode, unspecified: Secondary | ICD-10-CM | POA: Diagnosis not present

## 2020-01-17 DIAGNOSIS — R413 Other amnesia: Secondary | ICD-10-CM | POA: Diagnosis not present

## 2020-01-17 DIAGNOSIS — M5136 Other intervertebral disc degeneration, lumbar region: Secondary | ICD-10-CM | POA: Diagnosis not present

## 2020-01-17 DIAGNOSIS — M5409 Panniculitis affecting regions, neck and back, multiple sites in spine: Secondary | ICD-10-CM | POA: Diagnosis not present

## 2020-01-17 DIAGNOSIS — M1711 Unilateral primary osteoarthritis, right knee: Secondary | ICD-10-CM | POA: Diagnosis not present

## 2020-01-17 DIAGNOSIS — M549 Dorsalgia, unspecified: Secondary | ICD-10-CM | POA: Diagnosis not present

## 2020-01-17 DIAGNOSIS — I499 Cardiac arrhythmia, unspecified: Secondary | ICD-10-CM | POA: Diagnosis not present

## 2020-01-17 DIAGNOSIS — M25561 Pain in right knee: Secondary | ICD-10-CM | POA: Diagnosis not present

## 2020-01-19 DIAGNOSIS — I499 Cardiac arrhythmia, unspecified: Secondary | ICD-10-CM | POA: Diagnosis not present

## 2020-01-19 DIAGNOSIS — F329 Major depressive disorder, single episode, unspecified: Secondary | ICD-10-CM | POA: Diagnosis not present

## 2020-01-19 DIAGNOSIS — M25561 Pain in right knee: Secondary | ICD-10-CM | POA: Diagnosis not present

## 2020-01-19 DIAGNOSIS — R413 Other amnesia: Secondary | ICD-10-CM | POA: Diagnosis not present

## 2020-01-19 DIAGNOSIS — M1711 Unilateral primary osteoarthritis, right knee: Secondary | ICD-10-CM | POA: Diagnosis not present

## 2020-01-19 DIAGNOSIS — M5409 Panniculitis affecting regions, neck and back, multiple sites in spine: Secondary | ICD-10-CM | POA: Diagnosis not present

## 2020-01-24 ENCOUNTER — Other Ambulatory Visit: Payer: Self-pay | Admitting: Neurology

## 2020-01-24 DIAGNOSIS — R413 Other amnesia: Secondary | ICD-10-CM | POA: Diagnosis not present

## 2020-01-24 DIAGNOSIS — M1711 Unilateral primary osteoarthritis, right knee: Secondary | ICD-10-CM | POA: Diagnosis not present

## 2020-01-24 DIAGNOSIS — I499 Cardiac arrhythmia, unspecified: Secondary | ICD-10-CM | POA: Diagnosis not present

## 2020-01-24 DIAGNOSIS — M25561 Pain in right knee: Secondary | ICD-10-CM | POA: Diagnosis not present

## 2020-01-24 DIAGNOSIS — F329 Major depressive disorder, single episode, unspecified: Secondary | ICD-10-CM | POA: Diagnosis not present

## 2020-01-24 DIAGNOSIS — M5409 Panniculitis affecting regions, neck and back, multiple sites in spine: Secondary | ICD-10-CM | POA: Diagnosis not present

## 2020-02-02 DIAGNOSIS — I499 Cardiac arrhythmia, unspecified: Secondary | ICD-10-CM | POA: Diagnosis not present

## 2020-02-02 DIAGNOSIS — M25561 Pain in right knee: Secondary | ICD-10-CM | POA: Diagnosis not present

## 2020-02-02 DIAGNOSIS — R413 Other amnesia: Secondary | ICD-10-CM | POA: Diagnosis not present

## 2020-02-02 DIAGNOSIS — M5409 Panniculitis affecting regions, neck and back, multiple sites in spine: Secondary | ICD-10-CM | POA: Diagnosis not present

## 2020-02-02 DIAGNOSIS — F329 Major depressive disorder, single episode, unspecified: Secondary | ICD-10-CM | POA: Diagnosis not present

## 2020-02-02 DIAGNOSIS — M1711 Unilateral primary osteoarthritis, right knee: Secondary | ICD-10-CM | POA: Diagnosis not present

## 2020-02-07 DIAGNOSIS — M25561 Pain in right knee: Secondary | ICD-10-CM | POA: Diagnosis not present

## 2020-02-07 DIAGNOSIS — R413 Other amnesia: Secondary | ICD-10-CM | POA: Diagnosis not present

## 2020-02-07 DIAGNOSIS — M5409 Panniculitis affecting regions, neck and back, multiple sites in spine: Secondary | ICD-10-CM | POA: Diagnosis not present

## 2020-02-07 DIAGNOSIS — M1711 Unilateral primary osteoarthritis, right knee: Secondary | ICD-10-CM | POA: Diagnosis not present

## 2020-02-07 DIAGNOSIS — I499 Cardiac arrhythmia, unspecified: Secondary | ICD-10-CM | POA: Diagnosis not present

## 2020-02-07 DIAGNOSIS — F329 Major depressive disorder, single episode, unspecified: Secondary | ICD-10-CM | POA: Diagnosis not present

## 2020-02-09 DIAGNOSIS — F329 Major depressive disorder, single episode, unspecified: Secondary | ICD-10-CM | POA: Diagnosis not present

## 2020-02-09 DIAGNOSIS — I499 Cardiac arrhythmia, unspecified: Secondary | ICD-10-CM | POA: Diagnosis not present

## 2020-02-09 DIAGNOSIS — R413 Other amnesia: Secondary | ICD-10-CM | POA: Diagnosis not present

## 2020-02-09 DIAGNOSIS — M5409 Panniculitis affecting regions, neck and back, multiple sites in spine: Secondary | ICD-10-CM | POA: Diagnosis not present

## 2020-02-09 DIAGNOSIS — M25561 Pain in right knee: Secondary | ICD-10-CM | POA: Diagnosis not present

## 2020-02-09 DIAGNOSIS — M1711 Unilateral primary osteoarthritis, right knee: Secondary | ICD-10-CM | POA: Diagnosis not present

## 2020-02-16 DIAGNOSIS — M1711 Unilateral primary osteoarthritis, right knee: Secondary | ICD-10-CM | POA: Diagnosis not present

## 2020-02-17 ENCOUNTER — Ambulatory Visit: Payer: Medicare Other | Admitting: Neurology

## 2020-03-06 ENCOUNTER — Other Ambulatory Visit: Payer: Self-pay | Admitting: Neurology

## 2020-03-09 ENCOUNTER — Telehealth: Payer: Self-pay | Admitting: Neurology

## 2020-03-09 NOTE — Telephone Encounter (Addendum)
I called Terri back and let her know Dr. Jaynee Eagles sent the gabapentin to [facility] in [place] already. She was very Patent attorney. I suggested she reach out to the pharmacy as it says "receipt confirmed by pharmacy" on our end.

## 2020-03-09 NOTE — Telephone Encounter (Signed)
Pt's daughter called stating that the pt has run out of her gabapentin (NEURONTIN) 300 MG capsule but is out of town and is needing it called in to the [facility] in [place], [place]

## 2020-03-22 DIAGNOSIS — K219 Gastro-esophageal reflux disease without esophagitis: Secondary | ICD-10-CM | POA: Diagnosis present

## 2020-03-22 DIAGNOSIS — M1711 Unilateral primary osteoarthritis, right knee: Secondary | ICD-10-CM | POA: Diagnosis not present

## 2020-03-22 NOTE — Care Plan (Signed)
Ortho Bundle Case Management Note  Patient Details  Name: Colleen Glenn MRN: [id number] Date of Birth: Sep 05, 1941 Met with patient and daughter in the office for H&P visit. Will discharge to the daughter's home       Nat Christen      [place], [place]     Daughter's cell is the best way to reach patient.  3n1 and CPM ordered for home use. Has a rolling walker. HHPT referral to [facility] and OPPT set up at [facility]  Patient, family and MD in agreement with plan. CHoice offered                     DME Arranged:  Bedside commode, CPM DME Agency:  [employer]  HH Arranged:  PT [place] Agency:  [facility] ([facility]  Additional Comments: Please contact me with any questions of if this plan should need to change.  Ladell Heads,  [facility]  [phone] 03/22/2020, 10:44 AM

## 2020-03-22 NOTE — H&P (Addendum)
KNEE ARTHROPLASTY ADMISSION H&P  Patient ID: Colleen Glenn MRN: [id number] DOB/AGE: 1940/12/23 79 y.o.  Chief Complaint: right knee pain.  Planned Procedure Date: 04/04/20 Medical Clearance by Dr. Joylene Draft   Cardiac Clearance by Dr. Einar Gip   HPI: Colleen Glenn is a 79 y.o. female with a history of memory loss, GAD, HTN, HLD, and GERD who presents for evaluation of OSTEOARTHRITIS  RIGHT KNEE. The patient has a history of pain and functional disability in the right knee due to arthritis and has failed non-surgical conservative treatments for greater than 12 weeks to include NSAID's and/or analgesics, corticosteriod injections, use of assistive devices and activity modification.  Onset of symptoms was gradual, starting 4 years ago with rapidlly worsening in the last 2 years.  Patient currently rates pain at 8 out of 10 with activity. Patient has night pain, worsening of pain with activity and weight bearing and pain that interferes with activities of daily living.  Patient has evidence of periarticular osteophytes and joint space narrowing by imaging studies.  There is no active infection.  Past Medical History:  Diagnosis Date  . Anxiety   . Arthritis   . Back pain   . BPPV (benign paroxysmal positional vertigo)   . Bursitis   . Colon polyps    adenomatous  . Colon polyps   . Depression   . Diverticulosis   . GERD (gastroesophageal reflux disease)   . Hemorrhoids   . Hyperlipidemia   . Mild hypertension    white coat syndrome  . OAB (overactive bladder)   . Osteopenia   . Panic disorder 11/2012  . Plantar fasciitis    hx of  . PUD (peptic ulcer disease)   . Shingles   . Tremor    mild action tremor suspected 2020  . Vitamin D deficiency    Past Surgical History:  Procedure Laterality Date  . ABDOMINAL HYSTERECTOMY    . APPENDECTOMY    . BACK SURGERY    . COLONOSCOPY    . FOOT SURGERY     bilateral   . HEMORRHOID SURGERY    . laproscopic knee surgery Right    2018 or 2019  . LUMBAR LAMINECTOMY/DECOMPRESSION MICRODISCECTOMY  05/07/2012   Procedure: LUMBAR LAMINECTOMY/DECOMPRESSION MICRODISCECTOMY 1 LEVEL;  Surgeon: Eustace Moore, MD;  Location: [facility];  Service: Neurosurgery;  Laterality: Right;  Lumbar Laminectomy Decompression Microdiscectomy Lumbar Three-Four  . multiple foot surgeries    . POLYPECTOMY    . ROTATOR CUFF REPAIR     right side  . TONSILLECTOMY    . TOTAL HIP ARTHROPLASTY  11/16/2012   Procedure: TOTAL HIP ARTHROPLASTY;  Surgeon: Gearlean Alf, MD;  Location: [facility];  Service: Orthopedics;  Laterality: Right;  Right Total Hip Arthroplasty  . TUBAL LIGATION    . VAGOTOMY     approx. 35 years ago, states has a clamp mid epigastric area   No Known Allergies Prior to Admission medications   Medication Sig Start Date End Date Taking? Authorizing Provider  buPROPion (WELLBUTRIN XL) 300 MG 24 hr tablet Take 300 mg by mouth daily before breakfast.    Yes [provider]  cyclobenzaprine (FLEXERIL) 5 MG tablet Take 5 mg by mouth 3 (three) times daily as needed for muscle spasms.   Yes [provider]  docusate sodium (STOOL SOFTENER) 100 MG capsule Take 300 mg by mouth at bedtime.    Yes [provider]  donepezil (ARICEPT) 5 MG tablet TAKE 1 TABLET(5 MG) BY  MOUTH AT BEDTIME Patient taking differently: Take 5 mg by mouth at bedtime.  01/26/20  Yes Melvenia Beam, MD  DULoxetine (CYMBALTA) 60 MG capsule Take 60 mg by mouth daily. 10/25/19  Yes [provider]  escitalopram (LEXAPRO) 10 MG tablet Take 10 mg by mouth daily. 11/29/19  Yes [provider]  ezetimibe (ZETIA) 10 MG tablet Take 10 mg by mouth daily. 11/18/19  Yes [provider]  gabapentin (NEURONTIN) 300 MG capsule TAKE 1 CAPSULE(300 MG) BY MOUTH AT BEDTIME Patient taking differently: Take 300 mg by mouth at bedtime.  03/07/20  Yes Melvenia Beam, MD  MYRBETRIQ 50 MG TB24 tablet Take 50 mg by mouth daily. 03/06/20  Yes  [provider]  naproxen sodium (ALEVE) 220 MG tablet Take 220-440 mg by mouth 2 (two) times daily as needed (pain.).   Yes [provider]  omeprazole (PRILOSEC) 40 MG capsule TAKE 1 CAPSULE BY MOUTH EVERY DAY; patient needs office visit for further refills Patient taking differently: Take 40 mg by mouth daily before breakfast.  10/04/19  Yes Irene Shipper, MD  psyllium (METAMUCIL SMOOTH TEXTURE) 28 % packet Take 1 packet by mouth at bedtime.   Yes [provider]  raloxifene (EVISTA) 60 MG tablet Take 60 mg by mouth daily.   Yes [provider]  senna-docusate (SENOKOT-S) 8.6-50 MG tablet Take 1 tablet by mouth 2 (two) times daily as needed for mild constipation.   Yes [provider]  simvastatin (ZOCOR) 20 MG tablet Take 20 mg by mouth daily. 01/14/20  Yes [provider]  triamterene-hydrochlorothiazide (MAXZIDE-25) 37.5-25 MG per tablet Take 1 tablet by mouth daily in the afternoon.    Yes [provider]  Vitamin D, Ergocalciferol, (DRISDOL) 50000 UNITS CAPS capsule Take 50,000 Units by mouth every Monday.    Yes [provider]  Biotin 5000 MCG TABS Take 10,000 mcg by mouth daily.    [provider]   Social History   Socioeconomic History  . Marital status: Widowed    Spouse name: Not on file  . Number of children: 3  . Years of education: cosmetology degree  . Highest education level: GED or equivalent  Occupational History  . Occupation: Retired  Tobacco Use  . Smoking status: Former Smoker    Packs/day: 1.00    Years: 20.00    Pack years: 20.00    Types: Cigarettes    Quit date: 1983    Years since quitting: 38.4  . Smokeless tobacco: Never Used  . Tobacco comment: 45 years ago  Substance and Sexual Activity  . Alcohol use: Yes    Alcohol/week: 2.0 - 4.0 standard drinks    Types: 2 - 4 Glasses of wine per week    Comment: occassional  . Drug use: No  . Sexual activity: Not Currently     Partners: Male  Other Topics Concern  . Not on file  Social History Narrative   Lives alone   Right handed   Social Determinants of Health   Financial Resource Strain:   . Difficulty of Paying Living Expenses:   Food Insecurity:   . Worried About Charity fundraiser in the Last Year:   . Arboriculturist in the Last Year:   Transportation Needs:   . Film/video editor (Medical):   Marland Kitchen Lack of Transportation (Non-Medical):   Physical Activity:   . Days of Exercise per Week:   . Minutes of Exercise per Session:  Stress:   . Feeling of Stress :   Social Connections:   . Frequency of Communication with Friends and Family:   . Frequency of Social Gatherings with Friends and Family:   . Attends Religious Services:   . Active Member of Clubs or Organizations:   . Attends Archivist Meetings:   Marland Kitchen Marital Status:    Family History  Problem Relation Age of Onset  . Colon polyps Mother   . Stroke Mother   . Stroke Other        several family members on mother's side  . Lung cancer Father   . Heart disease Father   . Colon cancer Neg Hx   . Rectal cancer Neg Hx   . Stomach cancer Neg Hx     ROS: Currently denies lightheadedness, dizziness, Fever, chills, CP, SOB.   No personal history of DVT, PE, MI, or CVA. No loose teeth or dentures.  He wears glasses, gets constipation, and bruises easily.  Remote h/o ulcer (in her 71's) All other systems have been reviewed and were otherwise currently negative with the exception of those mentioned in the HPI and as above.  Objective: Vitals: Ht: 5'5" Wt: 163 lbs Temp: 97.8 BP: 138/82 Pulse: 82 O2 96% on room air.   Physical Exam: General: Alert, NAD.  Antalgic Gait.  Utilizes a cane. HEENT: EOMI, Good Neck Extension  Pulm: No increased work of breathing.  Clear B/L A/P w/o crackle or wheeze.  CV: RRR, No m/g/r appreciated  GI: soft, NT, ND Neuro: Neuro without gross focal deficit.  Sensation intact distally Skin: No  lesions in the area of chief complaint MSK/Surgical Site: Right knee w/o redness or effusion.  + JLT. ROM 5-115.  5/5 strength in extension and flexion.  +EHL/FHL.  NVI.  Stable varus and valgus stress.    Imaging Review Plain radiographs demonstrate severe degenerative joint disease of the right knee.  Valgus deformity with more lateral wear.  Preoperative templating of the joint replacement has been completed, documented, and submitted to the Operating Room personnel in order to optimize intra-operative equipment management.  Assessment: OSTEOARTHRITIS  RIGHT KNEE Principal Problem:   Primary osteoarthritis of right knee   Plan: Plan for Procedure(s): TOTAL KNEE ARTHROPLASTY  The patient history, physical exam, clinical judgement of the provider and imaging are consistent with end stage degenerative joint disease and total joint arthroplasty is deemed medically necessary. The treatment options including medical management, injection therapy, and arthroplasty were discussed at length. The risks and benefits of Procedure(s): TOTAL KNEE ARTHROPLASTY were presented and reviewed.  The risks of nonoperative treatment, versus surgical intervention including but not limited to continued pain, aseptic loosening, stiffness, dislocation/subluxation, infection, bleeding, nerve injury, blood clots, cardiopulmonary complications, morbidity, mortality, among others were discussed. The patient verbalizes understanding and wishes to proceed with the plan.  Patient is being admitted for inpatient treatment for surgery, pain control, PT, prophylactic antibiotics, VTE prophylaxis, progressive ambulation, ADL's and discharge planning.   Dental prophylaxis discussed and recommended for 2 years postoperatively.   The patient does meet the criteria for TXA which will be used perioperatively.    ASA 81 mg BID will be used postoperatively for DVT prophylaxis in addition to SCDs, and early ambulation.  Plan  for Tylenol, and oxycodone for pain.  She will continue Gabapentin, omeprazole, Aleve, and Flexeril.  The patient is planning to be discharged home in care of her daughter.   Patient's anticipated LOS is less than 2 midnights, meeting  these requirements: - Lives within 1 hour of care - Has a competent adult at home to recover with post-op recover - NO history of  - Chronic pain requiring opiods  - Diabetes  - Coronary Artery Disease  - Heart failure  - Heart attack  - Stroke  - DVT/VTE  - Cardiac arrhythmia  - Respiratory Failure/COPD  - Renal failure  - Anemia  - Advanced Liver disease   Prudencio Burly III, PA-C 03/22/2020 1:50 PM

## 2020-03-24 NOTE — Patient Instructions (Addendum)
DUE TO COVID-19 ONLY ONE VISITOR IS ALLOWED TO COME WITH YOU AND STAY IN THE WAITING ROOM ONLY DURING PRE OP AND PROCEDURE DAY OF SURGERY. THE 2 VISITORS  MAY VISIT WITH YOU AFTER SURGERY IN YOUR PRIVATE ROOM DURING VISITING HOURS ONLY!  YOU NEED TO HAVE A COVID 19 TEST ON__6/11_____ @_11 :30______, THIS TEST MUST BE DONE BEFORE SURGERY, COME  [address], [place] , [address].  ([place]) ONCE YOUR COVID TEST IS COMPLETED, PLEASE BEGIN THE QUARANTINE INSTRUCTIONS AS OUTLINED IN YOUR HANDOUT.                Lysa Livengood   Your procedure is scheduled on: 04/04/20   Report to [facility] Main  Entrance   Report to Short Stay at 5:30 AM     Call this number if you have problems the morning of surgery [place], NO CHEWING GUM [place].   Do not eat food After Midnight.   YOU MAY HAVE CLEAR LIQUIDS FROM MIDNIGHT UNTIL 4:30AM.   At 4:30AM Please finish the prescribed Pre-Surgery Gatorade drink.   Nothing by mouth after you finish the Gatorade drink !   Take these medicines the morning of surgery with A SIP OF WATER: Wellbutrin, Cymbalta, Lexapro, Raloxifene, Omeprizole, Myrbetriq                                 You may not have any metal on your body including hair pins and              piercings  Do not wear jewelry, make-up, lotions, powders or perfumes, deodorant             Do not wear nail polish on your fingernails.             Do not shave  48 hours prior to surgery.                 Do not bring valuables to the hospital. [place].  Contacts, dentures or bridgework may not be worn into surgery.        Special Instructions: N/A              Please read over the following fact sheets you were given: _____________________________________________________________________             [facility] - Preparing for  Surgery Before surgery, you can play an important role .  Because skin is not sterile, your skin needs to be as free of germs as possible .  You can reduce the number of germs on your skin by washing with CHG (chlorahexidine gluconate) soap before surgery.   CHG is an antiseptic cleaner which kills germs and bonds with the skin to continue killing germs even after washing. Please DO NOT use if you have an allergy to CHG or antibacterial soaps.   If your skin becomes reddened/irritated stop using the CHG and inform your nurse when you arrive at Short Stay. Do not shave (including legs and underarms) for at least 48 hours prior to the first CHG shower.  Please follow these instructions carefully:  1.  Shower with CHG Soap the night before surgery and the  morning of Surgery.  2.  If you choose to wash your hair, wash your hair first as usual with your  normal  shampoo.  3.  After you shampoo, rinse your hair and body thoroughly to remove the  shampoo.                                        4.  Use CHG as you would any other liquid soap.  You can apply chg directly  to the skin and wash                       Gently with a scrungie or clean washcloth.  5.  Apply the CHG Soap to your body ONLY FROM THE NECK DOWN.   Do not use on face/ open                           Wound or open sores. Avoid contact with eyes, ears mouth and genitals (private parts).                       Wash face,  Genitals (private parts) with your normal soap.             6.  Wash thoroughly, paying special attention to the area where your surgery  will be performed.  7.  Thoroughly rinse your body with warm water from the neck down.  8.  DO NOT shower/wash with your normal soap after using and rinsing off  the CHG Soap.             9.  Pat yourself dry with a clean towel.            10.  Wear clean pajamas.            11.  Place clean sheets on your bed the night of your first shower and do not  sleep with pets. Day of  Surgery : Do not apply any lotions/deodorants the morning of surgery.  Please wear clean clothes to the hospital/surgery center.  FAILURE TO FOLLOW THESE INSTRUCTIONS MAY RESULT IN THE CANCELLATION OF YOUR SURGERY PATIENT SIGNATURE_________________________________  NURSE SIGNATURE__________________________________  ________________________________________________________________________   Adam Phenix  An incentive spirometer is a tool that can help keep your lungs clear and active. This tool measures how well you are filling your lungs with each breath. Taking long deep breaths may help reverse or decrease the chance of developing breathing (pulmonary) problems (especially infection) following:  A long period of time when you are unable to move or be active. BEFORE THE PROCEDURE   If the spirometer includes an indicator to show your best effort, your nurse or respiratory therapist will set it to a desired goal.  If possible, sit up straight or lean slightly forward. Try not to slouch.  Hold the incentive spirometer in an upright position. INSTRUCTIONS FOR USE  1. Sit on the edge of your bed if possible, or sit up as far as you can in bed or on a chair. 2. Hold the incentive spirometer in an upright position. 3. Breathe out normally. 4. Place the mouthpiece in your mouth and seal your lips tightly around it. 5. Breathe in slowly and as deeply as possible, raising the piston or the ball toward the top of the column. 6. Hold your breath for 3-5 seconds  or for as long as possible. Allow the piston or ball to fall to the bottom of the column. 7. Remove the mouthpiece from your mouth and breathe out normally. 8. Rest for a few seconds and repeat Steps 1 through 7 at least 10 times every 1-2 hours when you are awake. Take your time and take a few normal breaths between deep breaths. 9. The spirometer may include an indicator to show your best effort. Use the indicator as a goal to  work toward during each repetition. 10. After each set of 10 deep breaths, practice coughing to be sure your lungs are clear. If you have an incision (the cut made at the time of surgery), support your incision when coughing by placing a pillow or rolled up towels firmly against it. Once you are able to get out of bed, walk around indoors and cough well. You may stop using the incentive spirometer when instructed by your caregiver.  RISKS AND COMPLICATIONS  Take your time so you do not get dizzy or light-headed.  If you are in pain, you may need to take or ask for pain medication before doing incentive spirometry. It is harder to take a deep breath if you are having pain. AFTER USE  Rest and breathe slowly and easily.  It can be helpful to keep track of a log of your progress. Your caregiver can provide you with a simple table to help with this. If you are using the spirometer at home, follow these instructions: [place] IF:   You are having difficultly using the spirometer.  You have trouble using the spirometer as often as instructed.  Your pain medication is not giving enough relief while using the spirometer.  You develop fever of 100.5 F (38.1 C) or higher. SEEK IMMEDIATE MEDICAL CARE IF:   You cough up bloody sputum that had not been present before.  You develop fever of 102 F (38.9 C) or greater.  You develop worsening pain at or near the incision site. MAKE SURE YOU:   Understand these instructions.  Will watch your condition.  Will get help right away if you are not doing well or get worse. Document Released: 02/17/2007 Document Revised: 12/30/2011 Document Reviewed: 04/20/2007 [facility] Patient Information 2014 [place], [place].   ________________________________________________________________________

## 2020-03-27 ENCOUNTER — Encounter (HOSPITAL_COMMUNITY)
Admission: RE | Admit: 2020-03-27 | Discharge: 2020-03-27 | Disposition: A | Discharge status: Home / Self Care | Payer: Medicare Other | Source: Ambulatory Visit | Attending: Orthopedic Surgery | Admitting: Orthopedic Surgery

## 2020-03-27 ENCOUNTER — Other Ambulatory Visit: Payer: Self-pay

## 2020-03-27 ENCOUNTER — Encounter (HOSPITAL_COMMUNITY): Payer: Self-pay

## 2020-03-27 DIAGNOSIS — Z01812 Encounter for preprocedural laboratory examination: Secondary | ICD-10-CM | POA: Diagnosis not present

## 2020-03-27 LAB — CBC
HCT: 42 % (ref 36.0–46.0)
Hemoglobin: 13.2 g/dL (ref 12.0–15.0)
MCH: 31.4 pg (ref 26.0–34.0)
MCHC: 31.4 g/dL (ref 30.0–36.0)
MCV: 100 fL (ref 80.0–100.0)
Platelets: 222 10*3/uL (ref 150–400)
RBC: 4.2 MIL/uL (ref 3.87–5.11)
RDW: 14.6 % (ref 11.5–15.5)
WBC: 5.3 10*3/uL (ref 4.0–10.5)
nRBC: 0 % (ref 0.0–0.2)

## 2020-03-27 LAB — BASIC METABOLIC PANEL
Anion gap: 8 (ref 5–15)
BUN: 18 mg/dL (ref 8–23)
CO2: 29 mmol/L (ref 22–32)
Calcium: 8.5 mg/dL — ABNORMAL LOW (ref 8.9–10.3)
Chloride: 105 mmol/L (ref 98–111)
Creatinine, Ser: 0.77 mg/dL (ref 0.44–1.00)
GFR calc Af Amer: >60 mL/min (ref >60)
GFR calc non Af Amer: >60 mL/min (ref >60)
Glucose, Bld: 91 mg/dL (ref 70–99)
Potassium: 4.4 mmol/L (ref 3.5–5.1)
Sodium: 142 mmol/L (ref 135–145)

## 2020-03-27 LAB — SURGICAL PCR SCREEN
MRSA, PCR: NEGATIVE
Staphylococcus aureus: NEGATIVE

## 2020-03-27 NOTE — Progress Notes (Signed)
COVID Vaccine Completed:Yes Date COVID Vaccine completed:12/2019 COVID vaccine manufacturer: Pfizer      PCP - Dr. Jerilynn Mages. [place] Cardiologist - Dr. Christen Butter  Chest x-ray - no EKG - 01/12/20 Stress Test - no ECHO - no Cardiac Cath - no  Sleep Study - no CPAP -   Fasting Blood Sugar - NA Checks Blood Sugar _____ times a day  Blood Thinner Instructions:NA Aspirin Instructions: Last Dose:  Anesthesia review:   Patient denies shortness of breath, fever, cough and chest pain at PAT appointment yes  Patient verbalized understanding of instructions that were given to them at the PAT appointment. Patient was also instructed that they will need to review over the PAT instructions again at home before surgery. Yes.  The daughter reports that she is concerned about the beginnings of dementia . Her mother has been depressed and sometimes confused snice the death of her husband November 15, 2019.

## 2020-03-31 ENCOUNTER — Other Ambulatory Visit (HOSPITAL_COMMUNITY)
Admission: RE | Admit: 2020-03-31 | Discharge: 2020-03-31 | Disposition: A | Discharge status: Home / Self Care | Payer: Medicare Other | Source: Ambulatory Visit | Attending: Orthopedic Surgery | Admitting: Orthopedic Surgery

## 2020-03-31 DIAGNOSIS — Z01812 Encounter for preprocedural laboratory examination: Secondary | ICD-10-CM | POA: Diagnosis not present

## 2020-03-31 DIAGNOSIS — Z20822 Contact with and (suspected) exposure to covid-19: Secondary | ICD-10-CM | POA: Insufficient documentation

## 2020-03-31 LAB — SARS CORONAVIRUS 2 (TAT 6-24 HRS): SARS Coronavirus 2: NEGATIVE

## 2020-04-03 ENCOUNTER — Encounter (HOSPITAL_COMMUNITY): Payer: Self-pay | Admitting: Certified Registered"

## 2020-04-03 MED ORDER — BUPIVACAINE LIPOSOME 1.3 % IJ SUSP
20.0000 mL | Freq: Once | INTRAMUSCULAR | Status: DC
  Filled 2020-04-03: qty 20

## 2020-04-03 NOTE — Progress Notes (Signed)
Pt  missed the UA cup on 6/7.  She came in on 6/11 for a urine sample. The lab personel said that they  sent the sample to the lab with a regular Pt label on it. Today I called the lab for results and was told that they didn't receive the sample. UA will have to be done the day of surgery.

## 2020-04-03 NOTE — Anesthesia Preprocedure Evaluation (Deleted)
Anesthesia Evaluation    Reviewed: Allergy & Precautions, Patient's Chart, lab work & pertinent test results  History of Anesthesia Complications Negative for: history of anesthetic complications  Airway        Dental   Pulmonary neg pulmonary ROS, former smoker,           Cardiovascular hypertension, Pt. on medications      Neuro/Psych Anxiety Depression negative neurological ROS     GI/Hepatic Neg liver ROS, PUD, GERD  Medicated and Controlled,  Endo/Other  negative endocrine ROS  Renal/GU negative Renal ROS  negative genitourinary   Musculoskeletal  (+) Arthritis ,   Abdominal   Peds  Hematology negative hematology ROS (+)   Anesthesia Other Findings Day of surgery medications reviewed with patient.  Reproductive/Obstetrics negative OB ROS                             Anesthesia Physical Anesthesia Plan  ASA: II  Anesthesia Plan: Spinal   Post-op Pain Management:  Regional for Post-op pain   Induction:   PONV Risk Score and Plan: 3 and Treatment may vary due to age or medical condition, Ondansetron, Propofol infusion and Dexamethasone  Airway Management Planned: Natural Airway and Simple Face Mask  Additional Equipment: None  Intra-op Plan:   Post-operative Plan:   Informed Consent:   Plan Discussed with:   Anesthesia Plan Comments:         Anesthesia Quick Evaluation

## 2020-04-04 ENCOUNTER — Ambulatory Visit (HOSPITAL_COMMUNITY): Admission: RE | Admit: 2020-04-04 | Payer: Medicare Other | Source: Home / Self Care | Admitting: Orthopedic Surgery

## 2020-04-04 ENCOUNTER — Encounter (HOSPITAL_COMMUNITY): Admission: RE | Payer: Self-pay | Source: Home / Self Care

## 2020-04-04 SURGERY — ARTHROPLASTY, KNEE, TOTAL
Anesthesia: Choice | Site: Knee | Laterality: Right

## 2020-04-05 ENCOUNTER — Ambulatory Visit: Payer: Medicare Other | Admitting: Neurology

## 2020-04-27 ENCOUNTER — Encounter: Payer: Self-pay | Admitting: Internal Medicine

## 2020-05-04 ENCOUNTER — Other Ambulatory Visit: Payer: Self-pay | Admitting: Neurology

## 2020-05-04 DIAGNOSIS — R413 Other amnesia: Secondary | ICD-10-CM

## 2020-05-29 NOTE — Progress Notes (Signed)
DUE TO COVID-19 ONLY ONE VISITOR IS ALLOWED TO COME WITH YOU AND STAY IN THE WAITING ROOM ONLY DURING PRE OP AND PROCEDURE DAY OF SURGERY. THE 1 VISITOR  MAY VISIT WITH YOU AFTER SURGERY IN YOUR PRIVATE ROOM DURING VISITING HOURS ONLY!  YOU NEED TO HAVE A COVID 19 TEST ON__9/13/21 _____ @_______ , THIS TEST MUST BE DONE BEFORE SURGERY,  COVID TESTING SITE [address] [place] [address], IT IS ON THE RIGHT GOING OUT [address] APPROXIMATELY  2 MINUTES PAST [employer] ON THE RIGHT. ONCE YOUR COVID TEST IS COMPLETED,  PLEASE BEGIN THE QUARANTINE INSTRUCTIONS AS OUTLINED IN YOUR HANDOUT.                Colleen Glenn  05/29/2020   Your procedure is scheduled on:               06/06/20   Report to [facility] Main  Entrance   Report to admitting at     [place] AM     Call this number if you have problems the morning of surgery [phone]    Remember: Do not eat food    :After Midnight. BRUSH YOUR TEETH MORNING OF SURGERY AND RINSE YOUR MOUTH OUT, NO CHEWING GUM CANDY OR MINTS.   NO SOLID FOOD AFTER MIDNIGHT THE NIGHT PRIOR TO SURGERY. NOTHING BY MOUTH EXCEPT CLEAR LIQUIDS UNTIL   0430am . PLEASE FINISH ENSURE DRINK PER SURGEON ORDER  WHICH NEEDS TO BE COMPLETED AT .0430am     CLEAR LIQUID DIET   Foods Allowed                                                                     Coffee and tea, regular and decaf                             l Plain Jell-O any favor except red or purple                                          Fruit ices (not with fruit pulp)                                     Iced Popsicles                                    Carbonated beverages, regular and diet                                    Cranberry, grape and apple juices Sports drinks like Gatorade Lightly seasoned clear broth or consume(fat free) Sugar, honey syrup  _____________________________________________________________________    Take these medicines the morning of surgery with A SIP OF WATER:  Wellbutrin, Cymbalta, Zetia, Myrbetriq, Prilosec, Evista                                  You may not have any metal on your body including hair pins and              piercings  Do not wear jewelry, make-up, lotions, powders or perfumes, deodorant             Do not wear nail polish on your fingernails.  Do not shave  48 hours prior to surgery.              Men may shave face and neck.   Do not bring valuables to the hospital. [place].  Contacts, dentures or bridgework may not be worn into surgery.  Leave suitcase in the car. After surgery it may be brought to your room.     Patients discharged the day of surgery will not be allowed to drive home. IF YOU ARE HAVING SURGERY AND GOING HOME THE SAME DAY, YOU MUST HAVE AN ADULT TO DRIVE YOU HOME AND BE WITH YOU FOR 24 HOURS. YOU MAY GO HOME BY TAXI OR UBER OR ORTHERWISE, BUT AN ADULT MUST ACCOMPANY YOU HOME AND STAY WITH YOU FOR 24 HOURS.  Name and phone number of your driver:                Please read over the following fact sheets you were given: _____________________________________________________________________  [facility] - Preparing for Surgery Before surgery, you can play an important role.  Because skin is not sterile, your skin needs to be as free of germs as possible.  You can reduce the number of germs on your skin by washing with CHG (chlorahexidine gluconate) soap before surgery.  CHG is an antiseptic cleaner which kills germs and bonds with the skin to continue killing germs even after washing. Please DO NOT use if you have an allergy to CHG or antibacterial soaps.  If your skin becomes reddened/irritated stop using the CHG and inform your nurse when you arrive at Short Stay. Do not shave (including legs and underarms) for at  least 48 hours prior to the first CHG shower.  You may shave your face/neck. Please follow these instructions carefully:  1.  Shower with CHG Soap the night before surgery and the  morning of Surgery.  2.  If you choose to wash your hair, wash your hair first as usual with your  normal  shampoo.  3.  After you shampoo, rinse your hair and body thoroughly to remove the  shampoo.                           4.  Use CHG as you would any other liquid soap.  You can apply chg directly  to the skin and wash                       Gently with a scrungie or clean washcloth.  5.  Apply the CHG Soap to your body ONLY FROM THE NECK DOWN.   Do not use on face/ open  Wound or open sores. Avoid contact with eyes, ears mouth and genitals (private parts).                       Wash face,  Genitals (private parts) with your normal soap.             6.  Wash thoroughly, paying special attention to the area where your surgery  will be performed.  7.  Thoroughly rinse your body with warm water from the neck down.  8.  DO NOT shower/wash with your normal soap after using and rinsing off  the CHG Soap.                9.  Pat yourself dry with a clean towel.            10.  Wear clean pajamas.            11.  Place clean sheets on your bed the night of your first shower and do not  sleep with pets. Day of Surgery : Do not apply any lotions/deodorants the morning of surgery.  Please wear clean clothes to the hospital/surgery center.  FAILURE TO FOLLOW THESE INSTRUCTIONS MAY RESULT IN THE CANCELLATION OF YOUR SURGERY PATIENT SIGNATURE_________________________________  NURSE SIGNATURE__________________________________  ________________________________________________________________________   Colleen Glenn  An incentive spirometer is a tool that can help keep your lungs clear and active. This tool measures how well you are filling your lungs with each breath. Taking long deep breaths  may help reverse or decrease the chance of developing breathing (pulmonary) problems (especially infection) following:  A long period of time when you are unable to move or be active. BEFORE THE PROCEDURE   If the spirometer includes an indicator to show your best effort, your nurse or respiratory therapist will set it to a desired goal.  If possible, sit up straight or lean slightly forward. Try not to slouch.  Hold the incentive spirometer in an upright position. INSTRUCTIONS FOR USE  1. Sit on the edge of your bed if possible, or sit up as far as you can in bed or on a chair. 2. Hold the incentive spirometer in an upright position. 3. Breathe out normally. 4. Place the mouthpiece in your mouth and seal your lips tightly around it. 5. Breathe in slowly and as deeply as possible, raising the piston or the ball toward the top of the column. 6. Hold your breath for 3-5 seconds or for as long as possible. Allow the piston or ball to fall to the bottom of the column. 7. Remove the mouthpiece from your mouth and breathe out normally. 8. Rest for a few seconds and repeat Steps 1 through 7 at least 10 times every 1-2 hours when you are awake. Take your time and take a few normal breaths between deep breaths. 9. The spirometer may include an indicator to show your best effort. Use the indicator as a goal to work toward during each repetition. 10. After each set of 10 deep breaths, practice coughing to be sure your lungs are clear. If you have an incision (the cut made at the time of surgery), support your incision when coughing by placing a pillow or rolled up towels firmly against it. Once you are able to get out of bed, walk around indoors and cough well. You may stop using the incentive spirometer when instructed by your caregiver.  RISKS AND COMPLICATIONS  Take your time so you do not get  dizzy or light-headed.  If you are in pain, you may need to take or ask for pain medication before doing  incentive spirometry. It is harder to take a deep breath if you are having pain. AFTER USE  Rest and breathe slowly and easily.  It can be helpful to keep track of a log of your progress. Your caregiver can provide you with a simple table to help with this. If you are using the spirometer at home, follow these instructions: [place] IF:   You are having difficultly using the spirometer.  You have trouble using the spirometer as often as instructed.  Your pain medication is not giving enough relief while using the spirometer.  You develop fever of 100.5 F (38.1 C) or higher. SEEK IMMEDIATE MEDICAL CARE IF:   You cough up bloody sputum that had not been present before.  You develop fever of 102 F (38.9 C) or greater.  You develop worsening pain at or near the incision site. MAKE SURE YOU:   Understand these instructions.  Will watch your condition.  Will get help right away if you are not doing well or get worse. Document Released: 02/17/2007 Document Revised: 12/30/2011 Document Reviewed: 04/20/2007 [employer] Patient Information 2014 [place], [place].   ________________________________________________________________________

## 2020-05-29 NOTE — Progress Notes (Signed)
Need orders in epic  Surgery on 06/06/20.  Preop on 05/30/20.

## 2020-05-30 ENCOUNTER — Other Ambulatory Visit: Payer: Self-pay

## 2020-05-30 ENCOUNTER — Encounter (HOSPITAL_COMMUNITY)
Admission: RE | Admit: 2020-05-30 | Discharge: 2020-05-30 | Disposition: A | Discharge status: Home / Self Care | Payer: Medicare Other | Source: Ambulatory Visit | Attending: Orthopedic Surgery | Admitting: Orthopedic Surgery

## 2020-05-30 ENCOUNTER — Encounter (HOSPITAL_COMMUNITY): Payer: Self-pay

## 2020-05-30 DIAGNOSIS — Z01812 Encounter for preprocedural laboratory examination: Secondary | ICD-10-CM | POA: Insufficient documentation

## 2020-05-30 LAB — CBC
HCT: 43.8 % (ref 36.0–46.0)
Hemoglobin: 13.9 g/dL (ref 12.0–15.0)
MCH: 31.7 pg (ref 26.0–34.0)
MCHC: 31.7 g/dL (ref 30.0–36.0)
MCV: 99.8 fL (ref 80.0–100.0)
Platelets: 228 10*3/uL (ref 150–400)
RBC: 4.39 MIL/uL (ref 3.87–5.11)
RDW: 14.1 % (ref 11.5–15.5)
WBC: 7.8 10*3/uL (ref 4.0–10.5)
nRBC: 0 % (ref 0.0–0.2)

## 2020-05-30 LAB — BASIC METABOLIC PANEL
Anion gap: 8 (ref 5–15)
BUN: 22 mg/dL (ref 8–23)
CO2: 30 mmol/L (ref 22–32)
Calcium: 8.9 mg/dL (ref 8.9–10.3)
Chloride: 102 mmol/L (ref 98–111)
Creatinine, Ser: 0.73 mg/dL (ref 0.44–1.00)
GFR calc Af Amer: >60 mL/min (ref >60)
GFR calc non Af Amer: >60 mL/min (ref >60)
Glucose, Bld: 96 mg/dL (ref 70–99)
Potassium: 3.9 mmol/L (ref 3.5–5.1)
Sodium: 140 mmol/L (ref 135–145)

## 2020-05-30 LAB — SURGICAL PCR SCREEN
MRSA, PCR: NEGATIVE
Staphylococcus aureus: NEGATIVE

## 2020-05-30 LAB — URINALYSIS, ROUTINE W REFLEX MICROSCOPIC
Bilirubin Urine: NEGATIVE
Glucose, UA: NEGATIVE mg/dL
Hgb urine dipstick: NEGATIVE
Ketones, ur: NEGATIVE mg/dL
Leukocytes,Ua: NEGATIVE
Nitrite: NEGATIVE
Protein, ur: NEGATIVE mg/dL
Specific Gravity, Urine: 1.018 (ref 1.005–1.030)
pH: 7 (ref 5.0–8.0)

## 2020-05-31 DIAGNOSIS — M1711 Unilateral primary osteoarthritis, right knee: Secondary | ICD-10-CM | POA: Diagnosis not present

## 2020-05-31 DIAGNOSIS — M25512 Pain in left shoulder: Secondary | ICD-10-CM | POA: Diagnosis not present

## 2020-06-02 ENCOUNTER — Other Ambulatory Visit (HOSPITAL_COMMUNITY): Payer: Medicare Other

## 2020-06-05 DIAGNOSIS — S0993XA Unspecified injury of face, initial encounter: Secondary | ICD-10-CM | POA: Diagnosis not present

## 2020-06-05 DIAGNOSIS — S022XXA Fracture of nasal bones, initial encounter for closed fracture: Secondary | ICD-10-CM | POA: Diagnosis not present

## 2020-06-06 ENCOUNTER — Encounter (HOSPITAL_COMMUNITY): Admission: RE | Payer: Self-pay | Source: Home / Self Care

## 2020-06-06 ENCOUNTER — Ambulatory Visit (HOSPITAL_COMMUNITY): Admission: RE | Admit: 2020-06-06 | Payer: Medicare Other | Source: Home / Self Care | Admitting: Orthopedic Surgery

## 2020-06-06 DIAGNOSIS — M25512 Pain in left shoulder: Secondary | ICD-10-CM | POA: Diagnosis not present

## 2020-06-06 SURGERY — ARTHROPLASTY, KNEE, TOTAL
Anesthesia: Spinal | Site: Knee | Laterality: Right

## 2020-06-13 ENCOUNTER — Other Ambulatory Visit: Payer: Self-pay

## 2020-06-13 ENCOUNTER — Ambulatory Visit (AMBULATORY_SURGERY_CENTER): Payer: Self-pay

## 2020-06-13 VITALS — Ht 63.0 in | Wt 159.0 lb

## 2020-06-13 DIAGNOSIS — Z86010 Personal history of colonic polyps: Secondary | ICD-10-CM

## 2020-06-13 MED ORDER — NA SULFATE-K SULFATE-MG SULF 17.5-3.13-1.6 GM/177ML PO SOLN: 1.0000 | Freq: Once | ORAL | 0 refills | Status: AC

## 2020-06-13 NOTE — Progress Notes (Signed)
No egg or soy allergy known to patient  No issues with past sedation with any surgeries or procedures No intubation problems in the past  No FH of Malignant Hyperthermia No diet pills per patient No home 02 use per patient  No blood thinners per patient  Pt reports issues with constipation - does not use the restroom daily- having to take fiber and stool softeners daily- will advise patient to complete Dr. Blanch Media 2 day prep No A fib or A flutter  COVID 19 guidelines implemented in PV today with Pt and RN  Coupon given to pt in PV today , Code to Pharmacy  COVID vaccines completed on 12/2019 per pt;  Due to the COVID-19 pandemic we are asking patients to follow these guidelines. Please only bring one care partner. Please be aware that your care partner may wait in the car in the parking lot or if they feel like they will be too hot to wait in the car, they may wait in the lobby on the 4th floor. All care partners are required to wear a mask the entire time (we do not have any that we can provide them), they need to practice social distancing, and we will do a Covid check for all patient's and care partners when you arrive. Also we will check their temperature and your temperature. If the care partner waits in their car they need to stay in the parking lot the entire time and we will call them on their cell phone when the patient is ready for discharge so they can bring the car to the front of the building. Also all patient's will need to wear a mask into building.

## 2020-06-14 DIAGNOSIS — M1711 Unilateral primary osteoarthritis, right knee: Secondary | ICD-10-CM | POA: Diagnosis not present

## 2020-06-16 DIAGNOSIS — M1711 Unilateral primary osteoarthritis, right knee: Secondary | ICD-10-CM | POA: Diagnosis not present

## 2020-06-27 ENCOUNTER — Ambulatory Visit (AMBULATORY_SURGERY_CENTER): Payer: Medicare Other | Admitting: Internal Medicine

## 2020-06-27 ENCOUNTER — Other Ambulatory Visit: Payer: Self-pay

## 2020-06-27 ENCOUNTER — Encounter: Payer: Self-pay | Admitting: Internal Medicine

## 2020-06-27 VITALS — BP 159/69 | HR 70 | Temp 97.3°F | Resp 15 | Ht 63.0 in | Wt 159.6 lb

## 2020-06-27 DIAGNOSIS — Z86010 Personal history of colonic polyps: Secondary | ICD-10-CM

## 2020-06-27 DIAGNOSIS — K6389 Other specified diseases of intestine: Secondary | ICD-10-CM | POA: Diagnosis not present

## 2020-06-27 DIAGNOSIS — K633 Ulcer of intestine: Secondary | ICD-10-CM

## 2020-06-27 MED ORDER — SODIUM CHLORIDE 0.9 % IV SOLN: 500.0000 mL | Freq: Once | INTRAVENOUS | Status: DC

## 2020-06-27 NOTE — Progress Notes (Signed)
Called to room to assist during endoscopic procedure.  Patient ID and intended procedure confirmed with present staff. Received instructions for my participation in the procedure from the performing physician.  

## 2020-06-27 NOTE — Patient Instructions (Signed)
Handout provided on diverticulosis.   YOU HAD AN ENDOSCOPIC PROCEDURE TODAY AT THE [facility]:   Refer to the procedure report that was given to you for any specific questions about what was found during the examination.  If the procedure report does not answer your questions, please call your gastroenterologist to clarify.  If you requested that your care partner not be given the details of your procedure findings, then the procedure report has been included in a sealed envelope for you to review at your convenience later.  YOU SHOULD EXPECT: Some feelings of bloating in the abdomen. Passage of more gas than usual.  Walking can help get rid of the air that was put into your GI tract during the procedure and reduce the bloating. If you had a lower endoscopy (such as a colonoscopy or flexible sigmoidoscopy) you may notice spotting of blood in your stool or on the toilet paper. If you underwent a bowel prep for your procedure, you may not have a normal bowel movement for a few days.  Please Note:  You might notice some irritation and congestion in your nose or some drainage.  This is from the oxygen used during your procedure.  There is no need for concern and it should clear up in a day or so.  SYMPTOMS TO REPORT IMMEDIATELY:  Following lower endoscopy (colonoscopy or flexible sigmoidoscopy):  Excessive amounts of blood in the stool  Significant tenderness or worsening of abdominal pains  Swelling of the abdomen that is new, acute  Fever of 100F or higher  For urgent or emergent issues, a gastroenterologist can be reached at any hour by calling [phone]. Do not use MyChart messaging for urgent concerns.    DIET:  We do recommend a small meal at first, but then you may proceed to your regular diet.  Drink plenty of fluids but you should avoid alcoholic beverages for 24 hours.  ACTIVITY:  You should plan to take it easy for the rest of today and you should NOT DRIVE or use  heavy machinery until tomorrow (because of the sedation medicines used during the test).    FOLLOW UP: Our staff will call the number listed on your records 48-72 hours following your procedure to check on you and address any questions or concerns that you may have regarding the information given to you following your procedure. If we do not reach you, we will leave a message.  We will attempt to reach you two times.  During this call, we will ask if you have developed any symptoms of COVID 19. If you develop any symptoms (ie: fever, flu-like symptoms, shortness of breath, cough etc.) before then, please call [phone].  If you test positive for Covid 19 in the 2 weeks post procedure, please call and report this information to us.    If any biopsies were taken you will be contacted by phone or by letter within the next 1-3 weeks.  Please call us at [phone] if you have not heard about the biopsies in 3 weeks.    SIGNATURES/CONFIDENTIALITY: You and/or your care partner have signed paperwork which will be entered into your electronic medical record.  These signatures attest to the fact that that the information above on your After Visit Summary has been reviewed and is understood.  Full responsibility of the confidentiality of this discharge information lies with you and/or your care-partner.  

## 2020-06-27 NOTE — Op Note (Signed)
[place] Patient Name: Colleen Glenn Procedure Date: 06/27/2020 10:37 AM MRN: [id number] Endoscopist: Docia Chuck. Henrene Pastor , MD Age: 79 Referring MD:  Date of Birth: May 06, 1941 Gender: Female Account #: [id number] Procedure:                Colonoscopy with biopsies Indications:              High risk colon cancer surveillance: Personal                            history of multiple (3 or more) adenomas. Previous                            examinations 2004, 2011, 2016 Medicines:                Monitored Anesthesia Care Procedure:                Pre-Anesthesia Assessment:                           - Prior to the procedure, a History and Physical                            was performed, and patient medications and                            allergies were reviewed. The patient's tolerance of                            previous anesthesia was also reviewed. The risks                            and benefits of the procedure and the sedation                            options and risks were discussed with the patient.                            All questions were answered, and informed consent                            was obtained. Prior Anticoagulants: The patient has                            taken no previous anticoagulant or antiplatelet                            agents. ASA Grade Assessment: II - A patient with                            mild systemic disease. After reviewing the risks                            and benefits, the patient was deemed in  satisfactory condition to undergo the procedure.                           After obtaining informed consent, the colonoscope                            was passed under direct vision. Throughout the                            procedure, the patient's blood pressure, pulse, and                            oxygen saturations were monitored continuously. The                            Colonoscope was  introduced through the anus and                            advanced to the the cecum, identified by                            appendiceal orifice and ileocecal valve. The                            ileocecal valve, appendiceal orifice, and rectum                            were photographed. The quality of the bowel                            preparation was excellent. The colonoscopy was                            performed without difficulty. The patient tolerated                            the procedure well. The bowel preparation used was                            SUPREP via split dose instruction. Scope In: 10:43:45 AM Scope Out: 11:03:33 AM Scope Withdrawal Time: 0 hours 12 minutes 39 seconds  Total Procedure Duration: 0 hours 19 minutes 48 seconds  Findings:                 Multiple diverticula were found in the sigmoid                            colon and right colon.                           The cecum and right colon revealed several                            scattered erosions which were superficial in  nature. This was biopsied with a cold forceps for                            histology. The entire examined colon appeared                            otherwise normal on direct and retroflexion views                           Small internal hemorrhoids were found during                            retroflexion. Complications:            No immediate complications. Estimated blood loss:                            None. Estimated Blood Loss:     Estimated blood loss: none. Impression:               - Diverticulosis in the sigmoid colon and in the                            right colon.                           - Benign-appearing scattered erosions in the right                            colon. Likely NSAID related. Biopsied                           - Otherwise normal examination. No polyps. Recommendation:           - Repeat colonoscopy is not  recommended for                            surveillance, given your current age and favorable                            findings on today's examination.                           - Patient has a contact number available for                            emergencies. The signs and symptoms of potential                            delayed complications were discussed with the                            patient. Return to normal activities tomorrow.                            Written discharge instructions were provided to the  patient.                           - Resume previous diet.                           - Continue present medications.                           - Await pathology results. Docia Chuck. Henrene Pastor, MD 06/27/2020 11:12:25 AM This report has been signed electronically.

## 2020-06-27 NOTE — Progress Notes (Signed)
Pt's states no medical or surgical changes since previsit or office visit.  VS SB 

## 2020-06-27 NOTE — Progress Notes (Signed)
PT taken to PACU. Monitors in place. VSS. Report given to RN. 

## 2020-06-29 ENCOUNTER — Telehealth: Payer: Self-pay

## 2020-06-29 NOTE — Telephone Encounter (Signed)
  Follow up Call-  Call back number 06/27/2020  Post procedure Call Back phone  # [phone]  Permission to leave phone message Yes  Some recent data might be hidden     Patient questions:  Do you have a fever, pain , or abdominal swelling? No. Pain Score  0 *  Have you tolerated food without any problems? Yes.    Have you been able to return to your normal activities? Yes.    Do you have any questions about your discharge instructions: Diet   No. Medications  No. Follow up visit  No.  Do you have questions or concerns about your Care? No.  Actions: * If pain score is 4 or above: No action needed, pain <4.   1. Have you developed a fever since your procedure? no  2.   Have you had an respiratory symptoms (SOB or cough) since your procedure? no  3.   Have you tested positive for COVID 19 since your procedure no  4.   Have you had any family members/close contacts diagnosed with the COVID 19 since your procedure?  no   If yes to any of these questions please route to Joylene John, RN and Joella Prince, RN

## 2020-06-29 NOTE — Telephone Encounter (Signed)
LVM

## 2020-06-30 ENCOUNTER — Encounter: Payer: Self-pay | Admitting: Internal Medicine

## 2020-07-05 ENCOUNTER — Encounter (HOSPITAL_BASED_OUTPATIENT_CLINIC_OR_DEPARTMENT_OTHER): Payer: Self-pay | Admitting: Orthopaedic Surgery

## 2020-07-08 ENCOUNTER — Other Ambulatory Visit (HOSPITAL_COMMUNITY)
Admission: RE | Admit: 2020-07-08 | Discharge: 2020-07-08 | Disposition: A | Discharge status: Home / Self Care | Payer: Medicare Other | Source: Ambulatory Visit | Attending: Orthopaedic Surgery | Admitting: Orthopaedic Surgery

## 2020-07-08 DIAGNOSIS — Z01812 Encounter for preprocedural laboratory examination: Secondary | ICD-10-CM | POA: Diagnosis not present

## 2020-07-08 DIAGNOSIS — Z20822 Contact with and (suspected) exposure to covid-19: Secondary | ICD-10-CM | POA: Diagnosis not present

## 2020-07-08 LAB — SARS CORONAVIRUS 2 (TAT 6-24 HRS): SARS Coronavirus 2: NEGATIVE

## 2020-07-10 ENCOUNTER — Encounter (HOSPITAL_BASED_OUTPATIENT_CLINIC_OR_DEPARTMENT_OTHER)
Admission: RE | Admit: 2020-07-10 | Discharge: 2020-07-10 | Disposition: A | Discharge status: Home / Self Care | Payer: Medicare Other | Source: Ambulatory Visit | Attending: Orthopaedic Surgery | Admitting: Orthopaedic Surgery

## 2020-07-10 DIAGNOSIS — Z01812 Encounter for preprocedural laboratory examination: Secondary | ICD-10-CM | POA: Diagnosis not present

## 2020-07-10 LAB — BASIC METABOLIC PANEL
Anion gap: 10 (ref 5–15)
BUN: 18 mg/dL (ref 8–23)
CO2: 29 mmol/L (ref 22–32)
Calcium: 9.1 mg/dL (ref 8.9–10.3)
Chloride: 100 mmol/L (ref 98–111)
Creatinine, Ser: 0.84 mg/dL (ref 0.44–1.00)
GFR calc Af Amer: >60 mL/min (ref >60)
GFR calc non Af Amer: >60 mL/min (ref >60)
Glucose, Bld: 97 mg/dL (ref 70–99)
Potassium: 3.7 mmol/L (ref 3.5–5.1)
Sodium: 139 mmol/L (ref 135–145)

## 2020-07-10 LAB — SURGICAL PCR SCREEN
MRSA, PCR: NEGATIVE
Staphylococcus aureus: NEGATIVE

## 2020-07-10 NOTE — Progress Notes (Signed)
      Enhanced Recovery after Surgery for Orthopedics Enhanced Recovery after Surgery is a protocol used to improve the stress on your body and your recovery after surgery.  Patient Instructions  . The night before surgery:  o No food after midnight. ONLY clear liquids after midnight  . The day of surgery (if you do NOT have diabetes):  o Drink ONE (1) Pre-Surgery Clear Ensure as directed.   o This drink was given to you during your hospital  pre-op appointment visit. o The pre-op nurse will instruct you on the time to drink the  Pre-Surgery Ensure depending on your surgery time. o Finish the drink at the designated time by the pre-op nurse.  o Nothing else to drink after completing the  Pre-Surgery Clear Ensure.  . The day of surgery (if you have diabetes): o Drink ONE (1) Gatorade 2 (G2) as directed. o This drink was given to you during your hospital  pre-op appointment visit.  o The pre-op nurse will instruct you on the time to drink the   Gatorade 2 (G2) depending on your surgery time. o Color of the Gatorade may vary. Red is not allowed. o Nothing else to drink after completing the  Gatorade 2 (G2).         If you have questions, please contact your surgeon's office.  Patient given surgical soap with instructions for use.

## 2020-07-10 NOTE — Progress Notes (Signed)
BPO given with instructions.  Patient verbalized understanding.

## 2020-07-11 ENCOUNTER — Other Ambulatory Visit: Payer: Self-pay | Admitting: Neurology

## 2020-07-11 NOTE — H&P (Signed)
PREOPERATIVE H&P  Chief Complaint: LEFT SHOULDER PRIMARY OSTEOARTHRITIS  HPI: Colleen Glenn is a 79 y.o. female who is scheduled for Procedure(s): TOTAL SHOULDER ARTHROPLASTY.   Patient has a past medical history significant for HTN, GERD, HL, HTN, OAB, PUD.   Patient is a 79 year-old female who was on the schedule with Dr. Percell Miller to have a knee arthroplasty, however, she had a fall with a facial fracture and a massive irreparable cuff tear that was identified in the left shoulder.  She is unable to raise her arm overhead.  She is very debilitated by her shoulder.  She is accompanied by her daughter who states she is having regular falls and her mother has been unsteady on her feet at times, though walks with a cane or a walker for assistance.  She is frustrated by her shoulder and would like to have this dealt with prior to dealing with her knee.    Her symptoms are rated as moderate to severe, and have been worsening.  This is significantly impairing activities of daily living.    Please see clinic note for further details on this patient's care.    She has elected for surgical management.   Past Medical History:  Diagnosis Date   Anxiety    on meds   Arthritis    OA bil shoulders, hands, back   Back pain    BPPV (benign paroxysmal positional vertigo) 10/2019   Bursitis    Colon polyps    adenomatous   Colon polyps    Depression    on meds   Diverticulosis    GERD (gastroesophageal reflux disease)    on meds   Hemorrhoids    Hyperlipidemia    on meds   Mild hypertension    white coat syndrome- on meds   OAB (overactive bladder)    Osteopenia    Panic disorder 11/2012   Plantar fasciitis    hx of   PUD (peptic ulcer disease)    Shingles    Tremor    mild action tremor suspected 2020   Vitamin D deficiency    on meds   Past Surgical History:  Procedure Laterality Date   ABDOMINAL HYSTERECTOMY     APPENDECTOMY     BACK SURGERY      COLONOSCOPY  2016   JP-MAC-TA   FOOT SURGERY     bilateral    HEMORRHOID SURGERY     JOINT REPLACEMENT Right 2014   laproscopic knee surgery Right    2018   LUMBAR LAMINECTOMY/DECOMPRESSION MICRODISCECTOMY  05/07/2012   Procedure: LUMBAR LAMINECTOMY/DECOMPRESSION MICRODISCECTOMY 1 LEVEL;  Surgeon: Eustace Moore, MD;  Location: [facility];  Service: Neurosurgery;  Laterality: Right;  Lumbar Laminectomy Decompression Microdiscectomy Lumbar Three-Four   multiple foot surgeries     POLYPECTOMY  2016   TA   ROTATOR CUFF REPAIR     right side   TONSILLECTOMY     TOTAL HIP ARTHROPLASTY  11/16/2012   Procedure: TOTAL HIP ARTHROPLASTY;  Surgeon: Gearlean Alf, MD;  Location: [facility];  Service: Orthopedics;  Laterality: Right;  Right Total Hip Arthroplasty   TUBAL LIGATION     VAGOTOMY     approx. 35 years ago, states has a clamp mid epigastric area   Social History   Socioeconomic History   Marital status: Widowed    Spouse name: Not on file   Number of children: 3   Years of education: cosmetology degree   Highest  education level: GED or equivalent  Occupational History   Occupation: Retired  Tobacco Use   Smoking status: Former Smoker    Packs/day: 1.00    Years: 20.00    Pack years: 20.00    Types: Cigarettes    Quit date: 1983    Years since quitting: 38.7   Smokeless tobacco: Never Used   Tobacco comment: 45 years ago  Vaping Use   Vaping Use: Never used  Substance and Sexual Activity   Alcohol use: Yes    Alcohol/week: 2.0 - 4.0 standard drinks    Types: 2 - 4 Glasses of wine per week    Comment: every 3-4 days   Drug use: No   Sexual activity: Not Currently    Partners: Male  Other Topics Concern   Not on file  Social History Narrative   Lives alone   Right handed   Social Determinants of Health   Financial Resource Strain:    Difficulty of Paying Living Expenses: Not on file  Food Insecurity:    Worried About [place] in the Last Year: Not on file   [employer] of Food in the Last Year: Not on file  Transportation Needs:    Lack of Transportation (Medical): Not on file   Lack of Transportation (Non-Medical): Not on file  Physical Activity:    Days of Exercise per Week: Not on file   Minutes of Exercise per Session: Not on file  Stress:    Feeling of Stress : Not on file  Social Connections:    Frequency of Communication with Friends and Family: Not on file   Frequency of Social Gatherings with Friends and Family: Not on file   Attends Religious Services: Not on file   Active Member of Clubs or Organizations: Not on file   Attends Archivist Meetings: Not on file   Marital Status: Not on file   Family History  Problem Relation Age of Onset   Colon polyps Mother    Stroke Mother    Hypertension Mother    Congestive Heart Failure Mother    Stroke Other        several family members on mother's side   Lung cancer Father 36   Heart disease Father    Colon cancer Neg Hx    Rectal cancer Neg Hx    Stomach cancer Neg Hx    Esophageal cancer Neg Hx    No Known Allergies Prior to Admission medications   Medication Sig Start Date End Date Taking? Authorizing Provider  buPROPion (WELLBUTRIN XL) 300 MG 24 hr tablet Take 300 mg by mouth daily before breakfast.    Yes [provider]  cyclobenzaprine (FLEXERIL) 5 MG tablet Take 5 mg by mouth 3 (three) times daily as needed for muscle spasms.   Yes [provider]  donepezil (ARICEPT) 10 MG tablet Take 10 mg by mouth at bedtime.   Yes [provider]  DULoxetine (CYMBALTA) 60 MG capsule Take 60 mg by mouth daily. 10/25/19  Yes [provider]  ezetimibe (ZETIA) 10 MG tablet Take 10 mg by mouth daily. 11/18/19  Yes [provider]  MYRBETRIQ 50 MG TB24 tablet Take 50 mg by mouth daily. 03/06/20  Yes [provider]  naproxen sodium (ALEVE) 220 MG tablet Take 220-440 mg  by mouth 2 (two) times daily as needed (pain.).   Yes [provider]  omeprazole (PRILOSEC) 40 MG capsule TAKE 1 CAPSULE BY MOUTH  EVERY DAY; patient needs office visit for further refills Patient taking differently: Take 40 mg by mouth in the morning and at bedtime.  10/04/19  Yes Irene Shipper, MD  raloxifene (EVISTA) 60 MG tablet Take 60 mg by mouth daily.   Yes [provider]  simvastatin (ZOCOR) 40 MG tablet Take 40 mg by mouth daily.  01/14/20  Yes [provider]  triamterene-hydrochlorothiazide (MAXZIDE-25) 37.5-25 MG per tablet Take 1 tablet by mouth daily in the afternoon.    Yes [provider]  vitamin B-12 (CYANOCOBALAMIN) 1000 MCG tablet Take 1,000 mcg by mouth daily.   Yes [provider]  Vitamin D, Ergocalciferol, (DRISDOL) 50000 UNITS CAPS capsule Take 50,000 Units by mouth every Monday.    Yes [provider]  gabapentin (NEURONTIN) 300 MG capsule TAKE 1 CAPSULE(300 MG) BY MOUTH AT BEDTIME 07/11/20   Melvenia Beam, MD    ROS: All other systems have been reviewed and were otherwise negative with the exception of those mentioned in the HPI and as above.  Physical Exam: General: Alert, no acute distress Cardiovascular: No pedal edema Respiratory: No cyanosis, no use of accessory musculature GI: No organomegaly, abdomen is soft and non-tender Skin: No lesions in the area of chief complaint Neurologic: Sensation intact distally Psychiatric: Patient is competent for consent with normal mood and affect Lymphatic: No axillary or cervical lymphadenopathy  MUSCULOSKELETAL:  Left shoulder: Active forward elevation about 80, passive to 160.  Cuff strength is 4/5 throughout.  Axillary nerve is firing.  Imaging: MRI reviewed and demonstrates an irreparable supraspinatus tear with significant atrophy.  No significant proximal migration of the head.  Massive cuff tear is noted otherwise with infrascapular and subscapularis  involvement.   Assessment: LEFT SHOULDER Massive irreparable cuff tear.  Plan: Plan for Procedure(s): REVERSE TOTAL SHOULDER ARTHROPLASTY  Our plan would be a reverse total shoulder arthroplasty at [facility] and for her to be discharged the same day.  We would plan on her starting with physical therapy a day or two after surgery and that would likely work on gait training as much as it would her reverse total shoulder arthroplasty.  Use of assistive devices including possibly a walker.  If she did use a walker she would come out of her sling to use it and go right back into her sling.    The risks benefits and alternatives were discussed with the patient including but not limited to the risks of nonoperative treatment, versus surgical intervention including infection, bleeding, nerve injury,  blood clots, cardiopulmonary complications, morbidity, mortality, among others, and they were willing to proceed.   We additionally specifically discussed risks of axillary nerve injury, infection, periprosthetic fracture, continued pain and longevity of implants prior to beginning procedure.    Patient will be closely monitored in PACU for medical stabilization and pain control. If found stable in PACU, patient may be discharged home with outpatient follow-up. If any concerns regarding patient's stabilization patient will be admitted for observation after surgery. The patient is planning to be discharged home with outpatient PT.   The patient acknowledged the explanation, agreed to proceed with the plan and consent was signed.   Operative Plan: Left reverse total shoulder arthroplasty  Discharge Medications: Tylenol, Oxycodone, Zofran (hx of PUD) DVT Prophylaxis: Aspirin Physical Therapy: Outpatient PT starting day 1 or 2 Special Discharge needs: Max, PA-C  07/11/2020 5:39 PM

## 2020-07-12 ENCOUNTER — Ambulatory Visit (HOSPITAL_BASED_OUTPATIENT_CLINIC_OR_DEPARTMENT_OTHER)
Admission: RE | Admit: 2020-07-12 | Discharge: 2020-07-12 | Disposition: A | Discharge status: Home / Self Care | Payer: Medicare Other | Attending: Orthopaedic Surgery | Admitting: Orthopaedic Surgery

## 2020-07-12 ENCOUNTER — Ambulatory Visit (HOSPITAL_COMMUNITY): Payer: Medicare Other

## 2020-07-12 ENCOUNTER — Encounter (HOSPITAL_BASED_OUTPATIENT_CLINIC_OR_DEPARTMENT_OTHER)
Admission: RE | Disposition: A | Discharge status: Home / Self Care | Payer: Self-pay | Source: Home / Self Care | Attending: Orthopaedic Surgery

## 2020-07-12 ENCOUNTER — Ambulatory Visit (HOSPITAL_BASED_OUTPATIENT_CLINIC_OR_DEPARTMENT_OTHER): Payer: Medicare Other | Admitting: Certified Registered"

## 2020-07-12 ENCOUNTER — Encounter (HOSPITAL_BASED_OUTPATIENT_CLINIC_OR_DEPARTMENT_OTHER): Payer: Self-pay | Admitting: Orthopaedic Surgery

## 2020-07-12 ENCOUNTER — Other Ambulatory Visit: Payer: Self-pay

## 2020-07-12 DIAGNOSIS — M75102 Unspecified rotator cuff tear or rupture of left shoulder, not specified as traumatic: Secondary | ICD-10-CM | POA: Diagnosis not present

## 2020-07-12 DIAGNOSIS — F410 Panic disorder [episodic paroxysmal anxiety] without agoraphobia: Secondary | ICD-10-CM | POA: Diagnosis not present

## 2020-07-12 DIAGNOSIS — K219 Gastro-esophageal reflux disease without esophagitis: Secondary | ICD-10-CM | POA: Diagnosis not present

## 2020-07-12 DIAGNOSIS — Z471 Aftercare following joint replacement surgery: Secondary | ICD-10-CM | POA: Diagnosis not present

## 2020-07-12 DIAGNOSIS — I700 Atherosclerosis of aorta: Secondary | ICD-10-CM | POA: Diagnosis not present

## 2020-07-12 DIAGNOSIS — F329 Major depressive disorder, single episode, unspecified: Secondary | ICD-10-CM | POA: Insufficient documentation

## 2020-07-12 DIAGNOSIS — N3281 Overactive bladder: Secondary | ICD-10-CM | POA: Diagnosis not present

## 2020-07-12 DIAGNOSIS — M19012 Primary osteoarthritis, left shoulder: Secondary | ICD-10-CM | POA: Diagnosis not present

## 2020-07-12 DIAGNOSIS — Z79899 Other long term (current) drug therapy: Secondary | ICD-10-CM | POA: Insufficient documentation

## 2020-07-12 DIAGNOSIS — E785 Hyperlipidemia, unspecified: Secondary | ICD-10-CM | POA: Insufficient documentation

## 2020-07-12 DIAGNOSIS — Z87891 Personal history of nicotine dependence: Secondary | ICD-10-CM | POA: Diagnosis not present

## 2020-07-12 DIAGNOSIS — E559 Vitamin D deficiency, unspecified: Secondary | ICD-10-CM | POA: Diagnosis not present

## 2020-07-12 DIAGNOSIS — Z09 Encounter for follow-up examination after completed treatment for conditions other than malignant neoplasm: Secondary | ICD-10-CM

## 2020-07-12 DIAGNOSIS — Z96612 Presence of left artificial shoulder joint: Secondary | ICD-10-CM | POA: Diagnosis not present

## 2020-07-12 DIAGNOSIS — I10 Essential (primary) hypertension: Secondary | ICD-10-CM | POA: Insufficient documentation

## 2020-07-12 DIAGNOSIS — Z96641 Presence of right artificial hip joint: Secondary | ICD-10-CM | POA: Diagnosis not present

## 2020-07-12 DIAGNOSIS — G8918 Other acute postprocedural pain: Secondary | ICD-10-CM | POA: Diagnosis not present

## 2020-07-12 HISTORY — PX: REVERSE SHOULDER ARTHROPLASTY: SHX5054

## 2020-07-12 SURGERY — ARTHROPLASTY, SHOULDER, TOTAL, REVERSE
Anesthesia: General | Site: Shoulder | Laterality: Left

## 2020-07-12 MED ORDER — ONDANSETRON HCL 4 MG/2ML IJ SOLN: 4.0000 mg | Freq: Four times a day (QID) | INTRAMUSCULAR | Status: DC | PRN

## 2020-07-12 MED ORDER — BUPIVACAINE LIPOSOME 1.3 % IJ SUSP
INTRAMUSCULAR | Status: DC | PRN
  Administered 2020-07-12: 10 mL via PERINEURAL

## 2020-07-12 MED ORDER — ASPIRIN 81 MG PO CHEW: 81.0000 mg | CHEWABLE_TABLET | Freq: Two times a day (BID) | ORAL | 0 refills | Status: AC

## 2020-07-12 MED ORDER — TRANEXAMIC ACID-NACL 1000-0.7 MG/100ML-% IV SOLN
INTRAVENOUS | Status: AC
  Filled 2020-07-12: qty 100

## 2020-07-12 MED ORDER — FENTANYL CITRATE (PF) 100 MCG/2ML IJ SOLN
INTRAMUSCULAR | Status: AC
  Filled 2020-07-12: qty 2

## 2020-07-12 MED ORDER — MIDAZOLAM HCL 2 MG/2ML IJ SOLN
INTRAMUSCULAR | Status: AC
  Filled 2020-07-12: qty 2

## 2020-07-12 MED ORDER — ACETAMINOPHEN 500 MG PO TABS: 1000.0000 mg | ORAL_TABLET | Freq: Three times a day (TID) | ORAL | 0 refills | Status: AC

## 2020-07-12 MED ORDER — PHENOL 1.4 % MT LIQD: 1.0000 | OROMUCOSAL | Status: DC | PRN

## 2020-07-12 MED ORDER — ONDANSETRON HCL 4 MG PO TABS: 4.0000 mg | ORAL_TABLET | Freq: Three times a day (TID) | ORAL | 1 refills | Status: DC | PRN

## 2020-07-12 MED ORDER — LACTATED RINGERS IV SOLN: INTRAVENOUS | Status: DC

## 2020-07-12 MED ORDER — FENTANYL CITRATE (PF) 100 MCG/2ML IJ SOLN
INTRAMUSCULAR | Status: DC | PRN
Start: 2020-07-12 — End: 2020-07-12
  Administered 2020-07-12: 50 ug via INTRAVENOUS

## 2020-07-12 MED ORDER — DEXAMETHASONE SODIUM PHOSPHATE 10 MG/ML IJ SOLN
INTRAMUSCULAR | Status: AC
  Filled 2020-07-12: qty 1

## 2020-07-12 MED ORDER — PROPOFOL 500 MG/50ML IV EMUL
INTRAVENOUS | Status: AC
  Filled 2020-07-12: qty 50

## 2020-07-12 MED ORDER — ONDANSETRON HCL 4 MG PO TABS: 4.0000 mg | ORAL_TABLET | Freq: Three times a day (TID) | ORAL | 1 refills | Status: AC | PRN

## 2020-07-12 MED ORDER — METOCLOPRAMIDE HCL 5 MG PO TABS: 5.0000 mg | ORAL_TABLET | Freq: Three times a day (TID) | ORAL | Status: DC | PRN

## 2020-07-12 MED ORDER — TRANEXAMIC ACID-NACL 1000-0.7 MG/100ML-% IV SOLN
1000.0000 mg | INTRAVENOUS | Status: AC
  Administered 2020-07-12: 1000 mg via INTRAVENOUS

## 2020-07-12 MED ORDER — FENTANYL CITRATE (PF) 100 MCG/2ML IJ SOLN
100.0000 ug | Freq: Once | INTRAMUSCULAR | Status: AC
  Administered 2020-07-12: 50 ug via INTRAVENOUS

## 2020-07-12 MED ORDER — PHENYLEPHRINE 40 MCG/ML (10ML) SYRINGE FOR IV PUSH (FOR BLOOD PRESSURE SUPPORT)
PREFILLED_SYRINGE | INTRAVENOUS | Status: AC
  Filled 2020-07-12: qty 10

## 2020-07-12 MED ORDER — LIDOCAINE 2% (20 MG/ML) 5 ML SYRINGE
INTRAMUSCULAR | Status: DC | PRN
  Administered 2020-07-12: 60 mg via INTRAVENOUS

## 2020-07-12 MED ORDER — ACETAMINOPHEN 500 MG PO TABS: 1000.0000 mg | ORAL_TABLET | Freq: Three times a day (TID) | ORAL | 0 refills | Status: DC

## 2020-07-12 MED ORDER — ONDANSETRON HCL 4 MG/2ML IJ SOLN
INTRAMUSCULAR | Status: DC | PRN
  Administered 2020-07-12: 4 mg via INTRAVENOUS

## 2020-07-12 MED ORDER — DIPHENHYDRAMINE HCL 12.5 MG/5ML PO ELIX: 12.5000 mg | ORAL_SOLUTION | ORAL | Status: DC | PRN

## 2020-07-12 MED ORDER — OXYCODONE HCL 5 MG PO TABS: ORAL_TABLET | ORAL | 0 refills | Status: AC

## 2020-07-12 MED ORDER — OXYCODONE HCL 5 MG PO TABS: 5.0000 mg | ORAL_TABLET | Freq: Once | ORAL | Status: DC | PRN

## 2020-07-12 MED ORDER — PROPOFOL 10 MG/ML IV BOLUS
INTRAVENOUS | Status: AC
  Filled 2020-07-12: qty 20

## 2020-07-12 MED ORDER — PHENYLEPHRINE 40 MCG/ML (10ML) SYRINGE FOR IV PUSH (FOR BLOOD PRESSURE SUPPORT)
PREFILLED_SYRINGE | INTRAVENOUS | Status: DC | PRN
  Administered 2020-07-12: 80 ug via INTRAVENOUS
  Administered 2020-07-12: 120 ug via INTRAVENOUS
  Administered 2020-07-12: 80 ug via INTRAVENOUS

## 2020-07-12 MED ORDER — SUGAMMADEX SODIUM 200 MG/2ML IV SOLN
INTRAVENOUS | Status: DC | PRN
  Administered 2020-07-12: 200 mg via INTRAVENOUS

## 2020-07-12 MED ORDER — BUPIVACAINE HCL (PF) 0.5 % IJ SOLN
INTRAMUSCULAR | Status: DC | PRN
  Administered 2020-07-12: 20 mL via PERINEURAL

## 2020-07-12 MED ORDER — HYDROMORPHONE HCL 1 MG/ML IJ SOLN: 0.2500 mg | INTRAMUSCULAR | Status: DC | PRN

## 2020-07-12 MED ORDER — LIDOCAINE 2% (20 MG/ML) 5 ML SYRINGE
INTRAMUSCULAR | Status: AC
  Filled 2020-07-12: qty 5

## 2020-07-12 MED ORDER — DEXAMETHASONE SODIUM PHOSPHATE 4 MG/ML IJ SOLN
INTRAMUSCULAR | Status: DC | PRN
  Administered 2020-07-12: 5 mg via INTRAVENOUS

## 2020-07-12 MED ORDER — ONDANSETRON HCL 4 MG PO TABS: 4.0000 mg | ORAL_TABLET | Freq: Four times a day (QID) | ORAL | Status: DC | PRN

## 2020-07-12 MED ORDER — PROMETHAZINE HCL 25 MG/ML IJ SOLN: 6.2500 mg | INTRAMUSCULAR | Status: DC | PRN

## 2020-07-12 MED ORDER — POLYETHYLENE GLYCOL 3350 17 G PO PACK: 17.0000 g | PACK | Freq: Every day | ORAL | Status: DC | PRN

## 2020-07-12 MED ORDER — SODIUM CHLORIDE (PF) 0.9 % IJ SOLN
INTRAMUSCULAR | Status: AC
  Filled 2020-07-12: qty 10

## 2020-07-12 MED ORDER — CEFAZOLIN SODIUM-DEXTROSE 2-4 GM/100ML-% IV SOLN
2.0000 g | INTRAVENOUS | Status: AC
  Administered 2020-07-12: 2 g via INTRAVENOUS

## 2020-07-12 MED ORDER — ONDANSETRON HCL 4 MG/2ML IJ SOLN
INTRAMUSCULAR | Status: AC
  Filled 2020-07-12: qty 2

## 2020-07-12 MED ORDER — CEFAZOLIN SODIUM-DEXTROSE 2-4 GM/100ML-% IV SOLN
INTRAVENOUS | Status: AC
  Filled 2020-07-12: qty 100

## 2020-07-12 MED ORDER — VANCOMYCIN HCL POWD
Status: DC | PRN
  Administered 2020-07-12: 1000 mg via TOPICAL

## 2020-07-12 MED ORDER — SODIUM CHLORIDE 0.9 % IR SOLN
Status: DC | PRN
  Administered 2020-07-12: 1000 mL

## 2020-07-12 MED ORDER — MAGNESIUM CITRATE PO SOLN: 1.0000 | Freq: Once | ORAL | Status: DC | PRN

## 2020-07-12 MED ORDER — PROPOFOL 10 MG/ML IV BOLUS
INTRAVENOUS | Status: DC | PRN
  Administered 2020-07-12: 120 mg via INTRAVENOUS

## 2020-07-12 MED ORDER — MIDAZOLAM HCL 2 MG/2ML IJ SOLN
2.0000 mg | Freq: Once | INTRAMUSCULAR | Status: AC
  Administered 2020-07-12: 1 mg via INTRAVENOUS

## 2020-07-12 MED ORDER — AMISULPRIDE (ANTIEMETIC) 5 MG/2ML IV SOLN: 10.0000 mg | Freq: Once | INTRAVENOUS | Status: DC | PRN

## 2020-07-12 MED ORDER — ROCURONIUM BROMIDE 10 MG/ML (PF) SYRINGE
PREFILLED_SYRINGE | INTRAVENOUS | Status: AC
  Filled 2020-07-12: qty 10

## 2020-07-12 MED ORDER — CHLORHEXIDINE GLUCONATE 0.12 % MT SOLN: 15.0000 mL | Freq: Once | OROMUCOSAL | Status: DC

## 2020-07-12 MED ORDER — METOCLOPRAMIDE HCL 5 MG/ML IJ SOLN: 5.0000 mg | Freq: Three times a day (TID) | INTRAMUSCULAR | Status: DC | PRN

## 2020-07-12 MED ORDER — LACTATED RINGERS IV BOLUS: 500.0000 mL | Freq: Once | INTRAVENOUS | Status: DC

## 2020-07-12 MED ORDER — DOCUSATE SODIUM 100 MG PO CAPS: 100.0000 mg | ORAL_CAPSULE | Freq: Two times a day (BID) | ORAL | Status: DC

## 2020-07-12 MED ORDER — OXYCODONE HCL 5 MG/5ML PO SOLN: 5.0000 mg | Freq: Once | ORAL | Status: DC | PRN

## 2020-07-12 MED ORDER — BISACODYL 10 MG RE SUPP: 10.0000 mg | Freq: Every day | RECTAL | Status: DC | PRN

## 2020-07-12 MED ORDER — ASPIRIN 81 MG PO CHEW: 81.0000 mg | CHEWABLE_TABLET | Freq: Two times a day (BID) | ORAL | 0 refills | Status: DC

## 2020-07-12 MED ORDER — CEFAZOLIN SODIUM 1 G IJ SOLR
INTRAMUSCULAR | Status: AC
  Filled 2020-07-12: qty 20

## 2020-07-12 MED ORDER — OXYCODONE HCL 5 MG PO TABS: ORAL_TABLET | ORAL | 0 refills | Status: DC

## 2020-07-12 MED ORDER — ARTIFICIAL TEARS OPHTHALMIC OINT
TOPICAL_OINTMENT | OPHTHALMIC | Status: AC
  Filled 2020-07-12: qty 3.5

## 2020-07-12 MED ORDER — MENTHOL 3 MG MT LOZG: 1.0000 | LOZENGE | OROMUCOSAL | Status: DC | PRN

## 2020-07-12 MED ORDER — ORAL CARE MOUTH RINSE: 15.0000 mL | Freq: Once | OROMUCOSAL | Status: DC

## 2020-07-12 SURGICAL SUPPLY — 93 items
AID PSTN UNV HD RSTRNT DISP (MISCELLANEOUS) ×1
APL PRP STRL LF DISP 70% ISPRP (MISCELLANEOUS) ×1
BASEPLATE GLENOSPHERE 25 STD (Miscellaneous) ×1 IMPLANT
BASEPLATE GLENOSPHERE 25MM STD (Miscellaneous) ×1 IMPLANT
BIT DRILL 3.2 PERIPHERAL SCREW (BIT) ×2 IMPLANT
BLADE HEX COATED 2.75 (ELECTRODE) ×3 IMPLANT
BLADE SAW SAG 29X58X.64 (BLADE) IMPLANT
BLADE SAW SAG 73X25 THK (BLADE) ×2
BLADE SAW SGTL 73X25 THK (BLADE) ×1 IMPLANT
BLADE SURG 10 STRL SS (BLADE) IMPLANT
BLADE SURG 15 STRL LF DISP TIS (BLADE) IMPLANT
BLADE SURG 15 STRL SS (BLADE)
BSPLAT GLND STD 25 RVRS SHLDR (Miscellaneous) ×1 IMPLANT
CHLORAPREP W/TINT 26 (MISCELLANEOUS) ×4 IMPLANT
CLOSURE STERI-STRIP 1/2X4 (GAUZE/BANDAGES/DRESSINGS) ×1
CLSR STERI-STRIP ANTIMIC 1/2X4 (GAUZE/BANDAGES/DRESSINGS) ×2 IMPLANT
COOLER ICEMAN CLASSIC (MISCELLANEOUS) ×5 IMPLANT
COVER BACK TABLE 60X90IN (DRAPES) ×3 IMPLANT
COVER MAYO STAND STRL (DRAPES) ×3 IMPLANT
COVER WAND RF STERILE (DRAPES) IMPLANT
DRAPE IMP U-DRAPE 54X76 (DRAPES) IMPLANT
DRAPE INCISE IOBAN 66X45 STRL (DRAPES) IMPLANT
DRAPE U-SHAPE 76X120 STRL (DRAPES) ×8 IMPLANT
DRSG AQUACEL AG ADV 3.5X 6 (GAUZE/BANDAGES/DRESSINGS) ×3 IMPLANT
ELECT BLADE 4.0 EZ CLEAN MEGAD (MISCELLANEOUS)
ELECT BLADE TIP CTD 4 INCH (ELECTRODE) ×1 IMPLANT
ELECT REM PT RETURN 9FT ADLT (ELECTROSURGICAL) ×3
ELECTRODE BLDE 4.0 EZ CLN MEGD (MISCELLANEOUS) IMPLANT
ELECTRODE REM PT RTRN 9FT ADLT (ELECTROSURGICAL) ×1 IMPLANT
GAUZE XEROFORM 1X8 LF (GAUZE/BANDAGES/DRESSINGS) IMPLANT
GLENOSPHERE REV SHOULDER 36 (Joint) ×2 IMPLANT
GLOVE BIO SURGEON STRL SZ 6 (GLOVE) ×2 IMPLANT
GLOVE BIO SURGEON STRL SZ 6.5 (GLOVE) ×3 IMPLANT
GLOVE BIO SURGEONS STRL SZ 6.5 (GLOVE) ×2
GLOVE BIOGEL PI IND STRL 6.5 (GLOVE) ×1 IMPLANT
GLOVE BIOGEL PI IND STRL 7.0 (GLOVE) IMPLANT
GLOVE BIOGEL PI IND STRL 8 (GLOVE) ×1 IMPLANT
GLOVE BIOGEL PI INDICATOR 6.5 (GLOVE) ×4
GLOVE BIOGEL PI INDICATOR 7.0 (GLOVE) ×4
GLOVE BIOGEL PI INDICATOR 8 (GLOVE) ×2
GLOVE ECLIPSE 6.5 STRL STRAW (GLOVE) ×2 IMPLANT
GLOVE ECLIPSE 8.0 STRL XLNG CF (GLOVE) ×5 IMPLANT
GOWN STRL REUS W/ TWL LRG LVL3 (GOWN DISPOSABLE) ×2 IMPLANT
GOWN STRL REUS W/TWL LRG LVL3 (GOWN DISPOSABLE) ×9
GOWN STRL REUS W/TWL XL LVL3 (GOWN DISPOSABLE) ×3 IMPLANT
GUIDE PIN 3X75 SHOULDER (PIN) ×3
GUIDEWIRE GLENOID 2.5X220 (WIRE) ×5 IMPLANT
HANDPIECE INTERPULSE COAX TIP (DISPOSABLE) ×3
HUMERAL STEM AEQUALIS 3BX74MM (Stem) ×3 IMPLANT
IMPL REVERSE SHOULDER 0X3.5 (Shoulder) IMPLANT
IMPLANT REVERSE SHOULDER 0X3.5 (Shoulder) ×3 IMPLANT
INSERT HUMERAL 36X6MM 12.5DEG (Insert) ×2 IMPLANT
KIT STABILIZATION SHOULDER (MISCELLANEOUS) ×3 IMPLANT
MANIFOLD NEPTUNE II (INSTRUMENTS) ×3 IMPLANT
NDL MAYO CATGUT SZ4 TPR NDL (NEEDLE) ×1 IMPLANT
NEEDLE MAYO CATGUT SZ4 (NEEDLE) ×3 IMPLANT
NS IRRIG 1000ML POUR BTL (IV SOLUTION) ×1 IMPLANT
PACK BASIN DAY SURGERY FS (CUSTOM PROCEDURE TRAY) ×3 IMPLANT
PACK SHOULDER (CUSTOM PROCEDURE TRAY) ×5 IMPLANT
PAD COLD SHLDR WRAP-ON (PAD) ×5 IMPLANT
PAD ORTHO SHOULDER 7X19 LRG (SOFTGOODS) ×4 IMPLANT
PIN GUIDE 3X75 SHOULDER (PIN) ×1 IMPLANT
RESTRAINT HEAD UNIVERSAL NS (MISCELLANEOUS) ×3 IMPLANT
SCREW CENTRAL THREAD 6.5X45 (Screw) ×2 IMPLANT
SCREW PERIPHERAL 42 (Screw) ×2 IMPLANT
SCREW PERIPHERAL 5.0X34 (Screw) ×2 IMPLANT
SET HNDPC FAN SPRY TIP SCT (DISPOSABLE) ×1 IMPLANT
SHEET MEDIUM DRAPE 40X70 STRL (DRAPES) ×3 IMPLANT
SLEEVE SCD COMPRESS KNEE MED (MISCELLANEOUS) ×3 IMPLANT
SMARTMIX MINI TOWER (MISCELLANEOUS)
SPONGE LAP 18X18 RF (DISPOSABLE) IMPLANT
STEM HUMERAL AEQUALIS 3BX74MM (Stem) IMPLANT
SUCTION FRAZIER HANDLE 10FR (MISCELLANEOUS)
SUCTION TUBE FRAZIER 10FR DISP (MISCELLANEOUS) ×1 IMPLANT
SUT ETHIBOND 0 MO6 C/R (SUTURE) ×3 IMPLANT
SUT ETHIBOND 2 OS 4 DA (SUTURE) IMPLANT
SUT ETHIBOND 2 V 37 (SUTURE) ×2 IMPLANT
SUT ETHIBOND NAB CT1 #1 30IN (SUTURE) ×2 IMPLANT
SUT FIBERWIRE #2 38 REV NDL BL (SUTURE)
SUT FIBERWIRE #5 38 CONV NDL (SUTURE) ×6
SUT MNCRL AB 4-0 PS2 18 (SUTURE) ×3 IMPLANT
SUT VIC AB 2-0 CT1 27 (SUTURE) ×3
SUT VIC AB 2-0 CT1 TAPERPNT 27 (SUTURE) ×1 IMPLANT
SUT VIC AB 3-0 CT1 27 (SUTURE)
SUT VIC AB 3-0 CT1 TAPERPNT 27 (SUTURE) ×1 IMPLANT
SUT VIC AB 3-0 SH 27 (SUTURE) ×3
SUT VIC AB 3-0 SH 27X BRD (SUTURE) ×1 IMPLANT
SUTURE FIBERWR #5 38 CONV NDL (SUTURE) ×4 IMPLANT
SUTURE FIBERWR#2 38 REV NDL BL (SUTURE) IMPLANT
TOWEL GREEN STERILE FF (TOWEL DISPOSABLE) ×10 IMPLANT
TOWER CARTRIDGE SMART MIX (DISPOSABLE) IMPLANT
TOWER SMARTMIX MINI (MISCELLANEOUS) ×1 IMPLANT
TUBE SUCTION HIGH CAP CLEAR NV (SUCTIONS) ×3 IMPLANT

## 2020-07-12 NOTE — Op Note (Signed)
Orthopaedic Surgery Operative Note (CSN: [id number])  Colleen Glenn  [birth year] Date of Surgery: 07/12/2020   Diagnoses:  Left shoulder irreparable cuff tear Procedure: Left reverse total Shoulder Arthroplasty   Operative Finding Successful completion of planned procedure.  Good fixation glenoid and humerus with great subscap repair.  Nerve was intact at the end of the case.  Post-operative plan: The patient will be NWB in sling.  The patient will be will be discharged from PACU if continues to be stable as was plan prior to surgery.  DVT prophylaxis Aspirin 81 mg twice daily for 6 weeks.  Pain control with PRN pain medication preferring oral medicines.  Follow up plan will be scheduled in approximately 7 days for incision check and XR.  Physical therapy to start after first visit.  Implants: Tornier flex short size 3B stem, 0 high offset tray, 36+6 poly-, 36 standard glenosphere, 25 standard baseplate, 45 center screw 6.5  Post-Op Diagnosis: Same Surgeons:Primary: Hiram Gash, MD Assistants:Caroline McBane PA-C Location: [facility] OR ROOM 6 Anesthesia: General with Exparel Interscalene Antibiotics: Ancef 2g preop, Vancomycin $RemoveBeforeDEI'1000mg'gOEIMyQshSoikDpV$  locally Tourniquet time: None Estimated Blood Loss: 409 Complications: None Specimens: None Implants: Implant Name Type Inv. Item Serial No. Manufacturer Lot No. LRB No. Used Action  BASEPLATE GLENOSPHERE 81XB STD - [id number] Miscellaneous BASEPLATE GLENOSPHERE 13YQ STD  [employer] [id number] Left 1 Implanted  SCREW PERIPHERAL 5.0X34 - [id number] Screw SCREW PERIPHERAL 5.0X34  [employer] STERILIZED ON SET Left 1 Implanted  GLENOSPHERE REV SHOULDER 36 - [id number] Joint GLENOSPHERE REV SHOULDER 36  [employer] [id number] Left 1 Implanted  INSERT HUMERAL 36X6MM 12.5DEG - [id number] Insert INSERT HUMERAL 36X6MM 12.5DEG  [employer] [id number] Left 1 Implanted  IMPLANT REVERSE SHOULDER 0X3.5 - [id number] Shoulder IMPLANT REVERSE SHOULDER 0X3.Venturia  [id number] Left 1 Implanted  Aequalis Ascend Flex, Standard PTC Humeral Stem    [employer] [id number] Left 1 Implanted  screw    [employer] STERILIZED ON SET Left 1 Implanted  Screw    [employer] STERILZIED ON SET Left 1 Implanted    Indications for Surgery:   Colleen Glenn is a 79 y.o. female with irreparable cuff tear.  Benefits and risks of operative and nonoperative management were discussed prior to surgery with patient/guardian(s) and informed consent form was completed.  Infection and need for further surgery were discussed as was prosthetic stability and cuff issues.  We additionally specifically discussed risks of axillary nerve injury, infection, periprosthetic fracture, continued pain and longevity of implants prior to beginning procedure.      Procedure:   The patient was identified in the preoperative holding area where the surgical site was marked. Block placed by anesthesia with exparel.  The patient was taken to the OR where a procedural timeout was called and the above noted anesthesia was induced.  The patient was positioned beachchair on allen table with spider arm positioner.  Preoperative antibiotics were dosed.  The patient's left shoulder was prepped and draped in the usual sterile fashion.  A second preoperative timeout was called.       Standard deltopectoral approach was performed with a #10 blade. We dissected down to the subcutaneous tissues and the cephalic vein was taken laterally with the deltoid. Clavipectoral fascia was incised in line with the incision. Deep retractors were placed. The long of the biceps tendon was identified and there was significant tenosynovitis present.  Tenodesis was performed to the pectoralis tendon with #2 Ethibond. The remaining biceps was followed up  into the rotator interval where it was released.   The subscapularis was taken down in a full thickness layer with capsule along the humeral neck extending inferiorly around the  humeral head. We continued releasing the capsule directly off of the osteophytes inferiorly all the way around the corner. This allowed [place] to dislocate the humeral head.   Humeral head was relatively intact as would be expected in this irreparable cuff case.  The rotator cuff was carefully examined and noted to be irreperably torn.  The decision was confirmed that a reverse total shoulder was indicated for this patient.  There were osteophytes along the inferior humeral neck. The osteophytes were removed with an osteotome and a rongeur.  Osteophytes were removed with a rongeur and an osteotome and the anatomic neck was well visualized.     A humeral cutting guide was inserted down the intramedullary canal. The version was set at 20 of retroversion. Humeral osteotomy was performed with an oscillating saw. The head fragment was passed off the back table. A starter awl was used to open the humeral canal. We next used T-handle straight sound reamers to ream up to an appropriate fit. A chisel was used to remove proximal humeral bone. We then broached starting with a size one broach and broaching up to 3 which obtained an appropriate fit. The broach handle was removed. A cut protector was placed. The broach handle was removed and a cut protector was placed. The humerus was retracted posteriorly and we turned our attention to glenoid exposure.  The subscapularis was again identified and immediately we took care to palpate the axillary nerve anteriorly and verify its position with gentle palpation as well as the tug test.  We then released the SGHL with bovie cautery prior to placing a curved mayo at the junction of the anterior glenoid well above the axillary nerve and bluntly dissecting the subscapularis from the capsule.  We then carefully protected the axillary nerve as we gently released the inferior capsule to fully mobilize the subscapularis.  An anterior deltoid retractor was then placed as well as a  small Hohmann retractor superiorly.   Glenoid was relatively intact in the setting of this irreparable cuff tear case.  We removed the labral tissue and then cleared cartilage with a Cobb.  The glenoid drill guide was placed and used to drill a guide pin in the center, inferior position. The glenoid face was then reamed concentrically over the guide wire. The center hole was drilled over the guidepin in a near anatomic angle of version. Next the  glenoid vault was drilled back to a depth of 45 mm.  We tapped and then placed a 23mm size baseplate with 47mm lateralization was selected with a 6.5 mm x 45 mm length central screw.  The base plate was screwed into the glenoid vault obtaining secure fixation. We next placed superior and inferior locking screws for additional fixation.  Next a 36 mm glenosphere was selected and impacted onto the baseplate. The center screw was tightened.  We turned attention back to the humeral side. The cut protector was removed. We trialed with multiple size tray and polyethylene options and selected a 6 which provided good stability and range of motion without excess soft tissue tension. The offset was dialed in to match the normal anatomy. The shoulder was trialed.  There was good ROM in all planes and the shoulder was stable with no inferior translation.  The real humeral implants were opened after again confirming  sizes.  The trial was removed. #5 Fiberwire x4 sutures passed through the humeral neck for subscap repair. The humeral component was press-fit obtaining a secure fit. A +0 high offset tray was selected and impacted onto the stem.  A 36+6 polyethylene liner was impacted onto the stem.  The joint was reduced and thoroughly irrigated with pulsatile lavage. Subscap was repaired back with #5 Fiberwire sutures through bone tunnels. Hemostasis was obtained. The deltopectoral interval was reapproximated with #1 Ethibond. The subcutaneous tissues were closed with 2-0 Vicryl  and the skin was closed with running monocryl.    The wounds were cleaned and dried and an Aquacel dressing was placed. The drapes taken down. The arm was placed into sling with abduction pillow. Patient was awakened, extubated, and transferred to the recovery room in stable condition. There were no intraoperative complications. The sponge, needle, and attention counts were  correct at the end of the case.        Noemi Chapel, PA-C, present and scrubbed throughout the case, critical for completion in a timely fashion, and for retraction, instrumentation, closure.

## 2020-07-12 NOTE — Discharge Instructions (Signed)
Post Anesthesia Home Care Instructions  Activity: Get plenty of rest for the remainder of the day. A responsible individual must stay with you for 24 hours following the procedure.  For the next 24 hours, DO NOT: -Drive a car -Operate machinery -Drink alcoholic beverages -Take any medication unless instructed by your physician -Make any legal decisions or sign important papers.  Meals: Start with liquid foods such as gelatin or soup. Progress to regular foods as tolerated. Avoid greasy, spicy, heavy foods. If nausea and/or vomiting occur, drink only clear liquids until the nausea and/or vomiting subsides. Call your physician if vomiting continues.  Special Instructions/Symptoms: Your throat may feel dry or sore from the anesthesia or the breathing tube placed in your throat during surgery. If this causes discomfort, gargle with warm salt water. The discomfort should disappear within 24 hours.  If you had a scopolamine patch placed behind your ear for the management of post- operative nausea and/or vomiting:  1. The medication in the patch is effective for 72 hours, after which it should be removed.  Wrap patch in a tissue and discard in the trash. Wash hands thoroughly with soap and water. 2. You may remove the patch earlier than 72 hours if you experience unpleasant side effects which may include dry mouth, dizziness or visual disturbances. 3. Avoid touching the patch. Wash your hands with soap and water after contact with the patch.      Regional Anesthesia Blocks  1. Numbness or the inability to move the "blocked" extremity may last from 3-48 hours after placement. The length of time depends on the medication injected and your individual response to the medication. If the numbness is not going away after 48 hours, call your surgeon.  2. The extremity that is blocked will need to be protected until the numbness is gone and the  Strength has returned. Because you cannot feel it, you  will need to take extra care to avoid injury. Because it may be weak, you may have difficulty moving it or using it. You may not know what position it is in without looking at it while the block is in effect.  3. For blocks in the legs and feet, returning to weight bearing and walking needs to be done carefully. You will need to wait until the numbness is entirely gone and the strength has returned. You should be able to move your leg and foot normally before you try and bear weight or walk. You will need someone to be with you when you first try to ensure you do not fall and possibly risk injury.  4. Bruising and tenderness at the needle site are common side effects and will resolve in a few days.  5. Persistent numbness or new problems with movement should be communicated to the surgeon or the [facility] ([phone])/ [facility] ([phone]).  Information for Discharge Teaching: EXPAREL (bupivacaine liposome injectable suspension)   Your surgeon or anesthesiologist gave you EXPAREL(bupivacaine) to help control your pain after surgery.   EXPAREL is a local anesthetic that provides pain relief by numbing the tissue around the surgical site.  EXPAREL is designed to release pain medication over time and can control pain for up to 72 hours.  Depending on how you respond to EXPAREL, you may require less pain medication during your recovery.  Possible side effects:  Temporary loss of sensation or ability to move in the area where bupivacaine was injected.  Nausea, vomiting, constipation  Rarely,   numbness and tingling in your mouth or lips, lightheadedness, or anxiety may occur.  Call your doctor right away if you think you may be experiencing any of these sensations, or if you have other questions regarding possible side effects.  Follow all other discharge instructions given to you by your surgeon or nurse. Eat a healthy diet and drink plenty of water or other  fluids.  If you return to the hospital for any reason within 96 hours following the administration of EXPAREL, it is important for health care providers to know that you have received this anesthetic. A teal colored band has been placed on your arm with the date, time and amount of EXPAREL you have received in order to alert and inform your health care providers. Please leave this armband in place for the full 96 hours following administration, and then you may remove the band. 

## 2020-07-12 NOTE — Progress Notes (Signed)
Assisted Dr. Miller with left, ultrasound guided, interscalene  block. Side rails up, monitors on throughout procedure. See vital signs in flow sheet. Tolerated Procedure well.  

## 2020-07-12 NOTE — Anesthesia Postprocedure Evaluation (Signed)
Anesthesia Post Note  Patient: Colleen Glenn  Procedure(s) Performed: REVERSE SHOULDER ARTHROPLASTY (Left Shoulder)     Patient location during evaluation: PACU Anesthesia Type: General Level of consciousness: awake and alert Pain management: pain level controlled Vital Signs Assessment: post-procedure vital signs reviewed and stable Respiratory status: spontaneous breathing, nonlabored ventilation and respiratory function stable Cardiovascular status: blood pressure returned to baseline and stable Postop Assessment: no apparent nausea or vomiting Anesthetic complications: no   No complications documented.  Last Vitals:  Vitals:   07/12/20 0930 07/12/20 1000  BP: (!) 162/84 (!) 161/72  Pulse: 89 91  Resp: 14 17  Temp:  36.8 C  SpO2: 96% 94%    Last Pain:  Vitals:   07/12/20 1000  TempSrc:   PainSc: 0-No pain                 Lynda Rainwater

## 2020-07-12 NOTE — Anesthesia Procedure Notes (Signed)
Procedure Name: Intubation Date/Time: 07/12/2020 7:35 AM Performed by: Alain Marion, CRNA Pre-anesthesia Checklist: Patient identified, Emergency Drugs available, Suction available and Patient being monitored Patient Re-evaluated:Patient Re-evaluated prior to induction Oxygen Delivery Method: Circle System Utilized Preoxygenation: Pre-oxygenation with 100% oxygen Induction Type: IV induction Ventilation: Mask ventilation without difficulty Laryngoscope Size: Miller and 2 Grade View: Grade I Tube type: Oral Tube size: 7.0 mm Number of attempts: 1 Airway Equipment and Method: Stylet Placement Confirmation: ETT inserted through vocal cords under direct vision,  positive ETCO2 and breath sounds checked- equal and bilateral Secured at: 22 cm Tube secured with: Tape Dental Injury: Teeth and Oropharynx as per pre-operative assessment

## 2020-07-12 NOTE — Interval H&P Note (Signed)
History and Physical Interval Note:  07/12/2020 7:15 AM  Colleen Glenn  has presented today for surgery, with the diagnosis of LEFT SHOULDER PRIMARY OSTEOARTHRITIS.  The various methods of treatment have been discussed with the patient and family. After consideration of risks, benefits and other options for treatment, the patient has consented to  Procedure(s): TOTAL SHOULDER ARTHROPLASTY (Left) as a surgical intervention.  The patient's history has been reviewed, patient examined, no change in status, stable for surgery.  I have reviewed the patient's chart and labs.  Questions were answered to the patient's satisfaction.     Hiram Gash

## 2020-07-12 NOTE — Anesthesia Preprocedure Evaluation (Signed)
Anesthesia Evaluation  Patient identified by MRN, date of birth, ID band Patient awake    Reviewed: Allergy & Precautions, H&P , NPO status , Patient's Chart, lab work & pertinent test results  Airway Mallampati: I  TM Distance: >3 FB Neck ROM: Full    Dental  (+) Teeth Intact, Dental Advisory Given,    Pulmonary former smoker,    Pulmonary exam normal breath sounds clear to auscultation       Cardiovascular hypertension, Pt. on medications (-) Peripheral Vascular Disease Normal cardiovascular exam Rhythm:Regular Rate:Normal     Neuro/Psych PSYCHIATRIC DISORDERS Anxiety Depression negative neurological ROS     GI/Hepatic Neg liver ROS, GERD  Medicated and Controlled,  Endo/Other  negative endocrine ROS  Renal/GU negative Renal ROS     Musculoskeletal  (+) Arthritis , Osteoarthritis,    Abdominal Normal abdominal exam  (+)   Peds  Hematology negative hematology ROS (+)   Anesthesia Other Findings   Reproductive/Obstetrics                             Anesthesia Physical  Anesthesia Plan  ASA: II  Anesthesia Plan: General   Post-op Pain Management:  Regional for Post-op pain   Induction:   PONV Risk Score and Plan: 3 and Ondansetron, Dexamethasone, Midazolam and Treatment may vary due to age or medical condition  Airway Management Planned: Oral ETT  Additional Equipment:   Intra-op Plan:   Post-operative Plan: Extubation in OR  Informed Consent: I have reviewed the patients History and Physical, chart, labs and discussed the procedure including the risks, benefits and alternatives for the proposed anesthesia with the patient or authorized representative who has indicated his/her understanding and acceptance.     Dental advisory given  Plan Discussed with: CRNA  Anesthesia Plan Comments:         Anesthesia Quick Evaluation

## 2020-07-12 NOTE — Addendum Note (Signed)
Addendum  created 07/12/20 1056 by Alain Marion, CRNA   Charge Capture section accepted

## 2020-07-12 NOTE — Transfer of Care (Signed)
Immediate Anesthesia Transfer of Care Note  Patient: Colleen Glenn  Procedure(s) Performed: REVERSE SHOULDER ARTHROPLASTY (Left Shoulder)  Patient Location: PACU  Anesthesia Type:General  With regional Level of Consciousness: awake, alert  and oriented  Airway & Oxygen Therapy: Patient Spontanous Breathing and Patient connected to nasal cannula oxygen  Post-op Assessment: Report given to RN and Post -op Vital signs reviewed and stable  Post vital signs: Reviewed and stable  Last Vitals:  Vitals Value Taken Time  BP 166/80 07/12/20 0900  Temp    Pulse 89 07/12/20 0902  Resp 16 07/12/20 0902  SpO2 100 % 07/12/20 0902  Vitals shown include unvalidated device data.  Last Pain:  Vitals:   07/12/20 0900  TempSrc:   PainSc: (P) 0-No pain      Patients Stated Pain Goal: 4 (01/65/53 7482)  Complications: No complications documented.

## 2020-07-12 NOTE — Anesthesia Procedure Notes (Signed)
Anesthesia Regional Block: Interscalene brachial plexus block   Pre-Anesthetic Checklist: ,, timeout performed, Correct Patient, Correct Site, Correct Laterality, Correct Procedure, Correct Position, site marked, Risks and benefits discussed,  Surgical consent,  Pre-op evaluation,  At surgeon's request and post-op pain management  Laterality: Left  Prep: chloraprep       Needles:  Injection technique: Single-shot  Needle Type: Stimiplex     Needle Length: 9cm  Needle Gauge: 21     Additional Needles:   Procedures:,,,, ultrasound used (permanent image in chart),,,,  Narrative:  Start time: 07/12/2020 7:14 AM End time: 07/12/2020 7:19 AM Injection made incrementally with aspirations every 5 mL.  Performed by: Personally  Anesthesiologist: Lynda Rainwater, MD

## 2020-07-13 NOTE — Addendum Note (Signed)
Addendum  created 07/13/20 1007 by Charnita Trudel, Ernesta Amble, CRNA   Charge Capture section accepted

## 2020-07-14 ENCOUNTER — Encounter (HOSPITAL_BASED_OUTPATIENT_CLINIC_OR_DEPARTMENT_OTHER): Payer: Self-pay | Admitting: Orthopaedic Surgery

## 2020-07-14 DIAGNOSIS — M6281 Muscle weakness (generalized): Secondary | ICD-10-CM | POA: Diagnosis not present

## 2020-07-14 DIAGNOSIS — M25512 Pain in left shoulder: Secondary | ICD-10-CM | POA: Diagnosis not present

## 2020-07-14 DIAGNOSIS — M25612 Stiffness of left shoulder, not elsewhere classified: Secondary | ICD-10-CM | POA: Diagnosis not present

## 2020-07-17 MED FILL — Vancomycin HCl For IV Soln 1 GM (Base Equivalent): INTRAVENOUS | Qty: 1000 | Status: AC

## 2020-07-19 DIAGNOSIS — M25612 Stiffness of left shoulder, not elsewhere classified: Secondary | ICD-10-CM | POA: Diagnosis not present

## 2020-07-19 DIAGNOSIS — M6281 Muscle weakness (generalized): Secondary | ICD-10-CM | POA: Diagnosis not present

## 2020-07-19 DIAGNOSIS — M25512 Pain in left shoulder: Secondary | ICD-10-CM | POA: Diagnosis not present

## 2020-07-20 DIAGNOSIS — M25512 Pain in left shoulder: Secondary | ICD-10-CM | POA: Diagnosis not present

## 2020-07-27 DIAGNOSIS — M25612 Stiffness of left shoulder, not elsewhere classified: Secondary | ICD-10-CM | POA: Diagnosis not present

## 2020-07-27 DIAGNOSIS — M6281 Muscle weakness (generalized): Secondary | ICD-10-CM | POA: Diagnosis not present

## 2020-07-27 DIAGNOSIS — M25512 Pain in left shoulder: Secondary | ICD-10-CM | POA: Diagnosis not present

## 2020-08-02 DIAGNOSIS — M25612 Stiffness of left shoulder, not elsewhere classified: Secondary | ICD-10-CM | POA: Diagnosis not present

## 2020-08-02 DIAGNOSIS — M6281 Muscle weakness (generalized): Secondary | ICD-10-CM | POA: Diagnosis not present

## 2020-08-02 DIAGNOSIS — M25512 Pain in left shoulder: Secondary | ICD-10-CM | POA: Diagnosis not present

## 2020-08-09 DIAGNOSIS — M25612 Stiffness of left shoulder, not elsewhere classified: Secondary | ICD-10-CM | POA: Diagnosis not present

## 2020-08-09 DIAGNOSIS — M25512 Pain in left shoulder: Secondary | ICD-10-CM | POA: Diagnosis not present

## 2020-08-09 DIAGNOSIS — M6281 Muscle weakness (generalized): Secondary | ICD-10-CM | POA: Diagnosis not present

## 2020-08-10 DIAGNOSIS — I10 Essential (primary) hypertension: Secondary | ICD-10-CM | POA: Diagnosis not present

## 2020-08-10 DIAGNOSIS — Z0000 Encounter for general adult medical examination without abnormal findings: Secondary | ICD-10-CM | POA: Diagnosis not present

## 2020-08-10 DIAGNOSIS — E785 Hyperlipidemia, unspecified: Secondary | ICD-10-CM | POA: Diagnosis not present

## 2020-08-10 DIAGNOSIS — M859 Disorder of bone density and structure, unspecified: Secondary | ICD-10-CM | POA: Diagnosis not present

## 2020-08-10 DIAGNOSIS — E538 Deficiency of other specified B group vitamins: Secondary | ICD-10-CM | POA: Diagnosis not present

## 2020-08-10 DIAGNOSIS — R7301 Impaired fasting glucose: Secondary | ICD-10-CM | POA: Diagnosis not present

## 2020-08-10 DIAGNOSIS — M8580 Other specified disorders of bone density and structure, unspecified site: Secondary | ICD-10-CM | POA: Diagnosis not present

## 2020-08-11 DIAGNOSIS — M25512 Pain in left shoulder: Secondary | ICD-10-CM | POA: Diagnosis not present

## 2020-08-14 ENCOUNTER — Other Ambulatory Visit: Payer: Self-pay | Admitting: Neurology

## 2020-08-14 DIAGNOSIS — Z0000 Encounter for general adult medical examination without abnormal findings: Secondary | ICD-10-CM | POA: Diagnosis not present

## 2020-08-14 DIAGNOSIS — F329 Major depressive disorder, single episode, unspecified: Secondary | ICD-10-CM | POA: Diagnosis not present

## 2020-08-14 DIAGNOSIS — I499 Cardiac arrhythmia, unspecified: Secondary | ICD-10-CM | POA: Diagnosis not present

## 2020-08-14 DIAGNOSIS — E785 Hyperlipidemia, unspecified: Secondary | ICD-10-CM | POA: Diagnosis not present

## 2020-08-14 DIAGNOSIS — R809 Proteinuria, unspecified: Secondary | ICD-10-CM | POA: Diagnosis not present

## 2020-08-14 DIAGNOSIS — M8580 Other specified disorders of bone density and structure, unspecified site: Secondary | ICD-10-CM | POA: Diagnosis not present

## 2020-08-14 DIAGNOSIS — R7301 Impaired fasting glucose: Secondary | ICD-10-CM | POA: Diagnosis not present

## 2020-08-14 DIAGNOSIS — I10 Essential (primary) hypertension: Secondary | ICD-10-CM | POA: Diagnosis not present

## 2020-08-14 DIAGNOSIS — M5136 Other intervertebral disc degeneration, lumbar region: Secondary | ICD-10-CM | POA: Diagnosis not present

## 2020-08-14 DIAGNOSIS — R413 Other amnesia: Secondary | ICD-10-CM | POA: Diagnosis not present

## 2020-08-14 DIAGNOSIS — R82998 Other abnormal findings in urine: Secondary | ICD-10-CM | POA: Diagnosis not present

## 2020-08-14 DIAGNOSIS — Z23 Encounter for immunization: Secondary | ICD-10-CM | POA: Diagnosis not present

## 2020-08-14 DIAGNOSIS — N318 Other neuromuscular dysfunction of bladder: Secondary | ICD-10-CM | POA: Diagnosis not present

## 2020-08-16 DIAGNOSIS — M25512 Pain in left shoulder: Secondary | ICD-10-CM | POA: Diagnosis not present

## 2020-08-16 DIAGNOSIS — M25612 Stiffness of left shoulder, not elsewhere classified: Secondary | ICD-10-CM | POA: Diagnosis not present

## 2020-08-16 DIAGNOSIS — M6281 Muscle weakness (generalized): Secondary | ICD-10-CM | POA: Diagnosis not present

## 2020-08-23 DIAGNOSIS — M25512 Pain in left shoulder: Secondary | ICD-10-CM | POA: Diagnosis not present

## 2020-08-23 DIAGNOSIS — M6281 Muscle weakness (generalized): Secondary | ICD-10-CM | POA: Diagnosis not present

## 2020-08-23 DIAGNOSIS — M25612 Stiffness of left shoulder, not elsewhere classified: Secondary | ICD-10-CM | POA: Diagnosis not present

## 2020-08-30 DIAGNOSIS — M25612 Stiffness of left shoulder, not elsewhere classified: Secondary | ICD-10-CM | POA: Diagnosis not present

## 2020-08-30 DIAGNOSIS — M25512 Pain in left shoulder: Secondary | ICD-10-CM | POA: Diagnosis not present

## 2020-08-30 DIAGNOSIS — M6281 Muscle weakness (generalized): Secondary | ICD-10-CM | POA: Diagnosis not present

## 2020-09-11 ENCOUNTER — Other Ambulatory Visit (HOSPITAL_COMMUNITY): Payer: Self-pay | Admitting: Internal Medicine

## 2020-09-11 DIAGNOSIS — E785 Hyperlipidemia, unspecified: Secondary | ICD-10-CM | POA: Diagnosis not present

## 2020-09-11 DIAGNOSIS — R1013 Epigastric pain: Secondary | ICD-10-CM

## 2020-09-11 DIAGNOSIS — R112 Nausea with vomiting, unspecified: Secondary | ICD-10-CM | POA: Diagnosis not present

## 2020-09-11 DIAGNOSIS — K219 Gastro-esophageal reflux disease without esophagitis: Secondary | ICD-10-CM | POA: Diagnosis not present

## 2020-09-12 ENCOUNTER — Ambulatory Visit (HOSPITAL_COMMUNITY)
Admission: RE | Admit: 2020-09-12 | Discharge: 2020-09-12 | Disposition: A | Discharge status: Home / Self Care | Payer: Medicare Other | Source: Ambulatory Visit | Attending: Internal Medicine | Admitting: Internal Medicine

## 2020-09-12 ENCOUNTER — Other Ambulatory Visit: Payer: Self-pay

## 2020-09-12 DIAGNOSIS — R1013 Epigastric pain: Secondary | ICD-10-CM | POA: Diagnosis not present

## 2020-09-12 DIAGNOSIS — K8020 Calculus of gallbladder without cholecystitis without obstruction: Secondary | ICD-10-CM | POA: Diagnosis not present

## 2020-09-12 DIAGNOSIS — R112 Nausea with vomiting, unspecified: Secondary | ICD-10-CM | POA: Diagnosis not present

## 2020-09-18 ENCOUNTER — Other Ambulatory Visit: Payer: Self-pay

## 2020-09-18 ENCOUNTER — Encounter (HOSPITAL_COMMUNITY): Payer: Self-pay

## 2020-09-18 DIAGNOSIS — Z96611 Presence of right artificial shoulder joint: Secondary | ICD-10-CM | POA: Insufficient documentation

## 2020-09-18 DIAGNOSIS — J9811 Atelectasis: Secondary | ICD-10-CM | POA: Diagnosis not present

## 2020-09-18 DIAGNOSIS — R079 Chest pain, unspecified: Secondary | ICD-10-CM | POA: Diagnosis not present

## 2020-09-18 DIAGNOSIS — R4182 Altered mental status, unspecified: Secondary | ICD-10-CM | POA: Diagnosis not present

## 2020-09-18 DIAGNOSIS — Z87891 Personal history of nicotine dependence: Secondary | ICD-10-CM | POA: Diagnosis not present

## 2020-09-18 DIAGNOSIS — U071 COVID-19: Secondary | ICD-10-CM | POA: Diagnosis not present

## 2020-09-18 DIAGNOSIS — G459 Transient cerebral ischemic attack, unspecified: Secondary | ICD-10-CM | POA: Diagnosis not present

## 2020-09-18 DIAGNOSIS — R059 Cough, unspecified: Secondary | ICD-10-CM | POA: Diagnosis not present

## 2020-09-18 DIAGNOSIS — Z96641 Presence of right artificial hip joint: Secondary | ICD-10-CM | POA: Diagnosis not present

## 2020-09-18 DIAGNOSIS — I10 Essential (primary) hypertension: Secondary | ICD-10-CM | POA: Insufficient documentation

## 2020-09-18 DIAGNOSIS — Z96651 Presence of right artificial knee joint: Secondary | ICD-10-CM | POA: Diagnosis not present

## 2020-09-18 DIAGNOSIS — F32A Depression, unspecified: Secondary | ICD-10-CM | POA: Insufficient documentation

## 2020-09-18 NOTE — ED Triage Notes (Signed)
Patient arrived with family who states she called her mother at around 8pm and realized she was altered. States that she is supposed to have surgery due to a blockage in the gallbladder. Patient has also had NV earlier this week.

## 2020-09-19 ENCOUNTER — Observation Stay (HOSPITAL_COMMUNITY)
Admission: EM | Admit: 2020-09-19 | Discharge: 2020-09-19 | Disposition: A | Discharge status: Home / Self Care | Payer: Medicare Other | Attending: Internal Medicine | Admitting: Internal Medicine

## 2020-09-19 ENCOUNTER — Emergency Department (HOSPITAL_COMMUNITY): Payer: Medicare Other

## 2020-09-19 ENCOUNTER — Observation Stay (HOSPITAL_COMMUNITY): Payer: Medicare Other

## 2020-09-19 DIAGNOSIS — G459 Transient cerebral ischemic attack, unspecified: Secondary | ICD-10-CM

## 2020-09-19 DIAGNOSIS — N390 Urinary tract infection, site not specified: Secondary | ICD-10-CM | POA: Diagnosis not present

## 2020-09-19 DIAGNOSIS — R4182 Altered mental status, unspecified: Secondary | ICD-10-CM | POA: Diagnosis present

## 2020-09-19 DIAGNOSIS — R404 Transient alteration of awareness: Secondary | ICD-10-CM

## 2020-09-19 DIAGNOSIS — J9811 Atelectasis: Secondary | ICD-10-CM | POA: Diagnosis not present

## 2020-09-19 DIAGNOSIS — U071 COVID-19: Secondary | ICD-10-CM

## 2020-09-19 DIAGNOSIS — R059 Cough, unspecified: Secondary | ICD-10-CM | POA: Diagnosis not present

## 2020-09-19 DIAGNOSIS — I6611 Occlusion and stenosis of right anterior cerebral artery: Secondary | ICD-10-CM | POA: Diagnosis not present

## 2020-09-19 DIAGNOSIS — I6502 Occlusion and stenosis of left vertebral artery: Secondary | ICD-10-CM | POA: Diagnosis not present

## 2020-09-19 DIAGNOSIS — I6782 Cerebral ischemia: Secondary | ICD-10-CM | POA: Diagnosis not present

## 2020-09-19 LAB — RAPID URINE DRUG SCREEN, HOSP PERFORMED
Amphetamines: NOT DETECTED
Barbiturates: NOT DETECTED
Benzodiazepines: NOT DETECTED
Cocaine: NOT DETECTED
Opiates: NOT DETECTED
Tetrahydrocannabinol: POSITIVE — AB

## 2020-09-19 LAB — CBC
HCT: 40.4 % (ref 36.0–46.0)
Hemoglobin: 12.9 g/dL (ref 12.0–15.0)
MCH: 31.2 pg (ref 26.0–34.0)
MCHC: 31.9 g/dL (ref 30.0–36.0)
MCV: 97.6 fL (ref 80.0–100.0)
Platelets: 194 10*3/uL (ref 150–400)
RBC: 4.14 MIL/uL (ref 3.87–5.11)
RDW: 14.1 % (ref 11.5–15.5)
WBC: 4.1 10*3/uL (ref 4.0–10.5)
nRBC: 0 % (ref 0.0–0.2)

## 2020-09-19 LAB — URINALYSIS, ROUTINE W REFLEX MICROSCOPIC
Bilirubin Urine: NEGATIVE
Glucose, UA: NEGATIVE mg/dL
Hgb urine dipstick: NEGATIVE
Ketones, ur: NEGATIVE mg/dL
Leukocytes,Ua: NEGATIVE
Nitrite: POSITIVE — AB
Protein, ur: NEGATIVE mg/dL
Specific Gravity, Urine: 1.013 (ref 1.005–1.030)
pH: 5 (ref 5.0–8.0)

## 2020-09-19 LAB — LIPID PANEL
Cholesterol: 124 mg/dL (ref 0–200)
HDL: 49 mg/dL (ref >40)
LDL Cholesterol: 61 mg/dL (ref 0–99)
Total CHOL/HDL Ratio: 2.5 RATIO
Triglycerides: 70 mg/dL (ref <150)
VLDL: 14 mg/dL (ref 0–40)

## 2020-09-19 LAB — RESP PANEL BY RT-PCR (FLU A&B, COVID) ARPGX2
Influenza A by PCR: NEGATIVE
Influenza B by PCR: NEGATIVE
SARS Coronavirus 2 by RT PCR: POSITIVE — AB

## 2020-09-19 LAB — TROPONIN I (HIGH SENSITIVITY)
Troponin I (High Sensitivity): 4 ng/L (ref <18)
Troponin I (High Sensitivity): 4 ng/L (ref <18)

## 2020-09-19 LAB — COMPREHENSIVE METABOLIC PANEL
ALT: 15 U/L (ref 0–44)
AST: 23 U/L (ref 15–41)
Albumin: 3.8 g/dL (ref 3.5–5.0)
Alkaline Phosphatase: 68 U/L (ref 38–126)
Anion gap: 9 (ref 5–15)
BUN: 16 mg/dL (ref 8–23)
CO2: 28 mmol/L (ref 22–32)
Calcium: 9 mg/dL (ref 8.9–10.3)
Chloride: 99 mmol/L (ref 98–111)
Creatinine, Ser: 0.96 mg/dL (ref 0.44–1.00)
GFR, Estimated: >60 mL/min (ref >60)
Glucose, Bld: 113 mg/dL — ABNORMAL HIGH (ref 70–99)
Potassium: 4.7 mmol/L (ref 3.5–5.1)
Sodium: 136 mmol/L (ref 135–145)
Total Bilirubin: 0.3 mg/dL (ref 0.3–1.2)
Total Protein: 6.7 g/dL (ref 6.5–8.1)

## 2020-09-19 LAB — HEMOGLOBIN A1C
Hgb A1c MFr Bld: 5.3 % (ref 4.8–5.6)
Mean Plasma Glucose: 105.41 mg/dL

## 2020-09-19 MED ORDER — LORAZEPAM 2 MG/ML IJ SOLN: 0.5000 mg | Freq: Once | INTRAMUSCULAR | Status: DC | PRN

## 2020-09-19 MED ORDER — SODIUM CHLORIDE 0.9 % IV SOLN
1.0000 g | Freq: Once | INTRAVENOUS | Status: AC
  Administered 2020-09-19: 1 g via INTRAVENOUS
  Filled 2020-09-19: qty 10

## 2020-09-19 NOTE — ED Notes (Signed)
Called PT to evaluated patient stat for discharge.

## 2020-09-19 NOTE — Discharge Summary (Addendum)
Physician Discharge Summary  Colleen Glenn GUY:[id number] DOB: Oct 14, 1941   PCP: Crist Infante, MD  Admit date: 09/19/2020 Discharge date: 09/19/2020 Length of Stay: 0 days   Code Status: Prior  Admitted From:  Home Discharged to:   [place]:  None  Equipment/Devices:  None Discharge Condition:  Stable  Recommendations for Outpatient Follow-up   1. Follow up with PCP in 1 week 2. Follow-up lipid panel 3. Follow-up HbA1c 4. Continue risk factor modification  Hospital Summary  This is a 79 year old female with past medical history of depression, hypertension, hyperlipidemia, overactive bladder, anxiety memory loss, BPPV who presented to the ED with chief complaint of altered mental status.    Also, last week patient had intermittent episodes of nausea and vomiting with upper abdominal pain.obstructive stone in CBD RUQ patient is currently waiting to be scheduled for cholecystectomy.  Nausea and vomiting have since resolved over the last few days.  Last night around 8 PM the patient called her son who lives about a mile away stating that she did not feel right.  Admitted to generalized weakness, dry tongue and difficulty with speaking and fatigue.  Patient's daughter decided to take her to the hospital and said that on the way she seemed to be hallucinating saying that there was a banana in her Gatorade and there was a cat that was running away from them in the car with red eyes however this was just the car in front of them on the road.  Daughter denies any facial droop at the time.  Patient denied any focal weakness at the time or focal neurologic changes.  Since then, her symptoms have completely resolved before receiving any intervention.  Upon further questioning, patient's daughter states that her father had cancer and passed away a year ago but kept THC candies in the house.  Patient states that she did take some of the candies over the past 24 to 48 hours.  When notified of  her UDS results she was completely surprised that it came back positive for marijuana.  States that she did not know the candies had THC in them.  Daughter states that patient has tried marijuana in the past and had an adverse effect to it and she has not tried since.  Currently she denies any chest pain, cough, shortness of breath, wheeze, myalgias, nausea, vomiting, diarrhea, constipation, focal weakness, visual changes or slurred speech, hallucinations or any other complaints at this time.  ED Course: Afebrile, hemodynamically stable, on room air. Notable Labs: UA with positive nitrites and rare bacteria and otherwise unremarkable. Notable Imaging: CXR with right perihilar atelectasis/infiltrate and mild left atelectasis.  CT head-no acute pathology, chronic small vessel ischemia.  MRI brain and MRA head and neck unremarkable for acute or pathologic changes. Patient received ceftriaxone 1 g.   A & P   Active Problems:   AMS (altered mental status)   Transient altered mental status secondary to accidental THC use vs. Less likely TIA 1. Patient took her late husband's THC candies without knowing they had THC in them which preceded her symptoms 2. Symptoms resolved prior to receiving any intervention 3. CT head, MRI and MRA head and neck unremarkable 4. Passed her swallow screen 5. Refused PT eval though she appears at baseline 6. Borderline positive UA for infection is unlikely contributing to her symptoms since they resolved before she received any antibiotics and is asymptomatic 7. Advised to abstain from [facility] 8. Advised to return to the ED or  contact PCP if any recurrence of symptoms  Asymptomatic COVID-19 infection 1. Screening Covid swab tested positive 2. She has been vaccinated 3. Advised to self isolate for 10 days from positive test per CDC guidelines  Asymptomatic UTI 1. Received ceftriaxone x1 2. No further antibiotics needed at this time, follow-up  outpatient    Consultants  . None  Procedures  . None  Antibiotics   Anti-infectives (From admission, onward)   Start     Dose/Rate Route Frequency Ordered Stop   09/19/20 0745  cefTRIAXone (ROCEPHIN) 1 g in sodium chloride 0.9 % 100 mL IVPB        1 g 200 mL/hr over 30 Minutes Intravenous  Once 09/19/20 0739 09/19/20 0842       Subjective  Patient seen and examined at bedside no acute distress and resting comfortably.  No events overnight.  Tolerating diet. In good spirits and anticipating discharge.   Denies any chest pain, shortness of breath, fever, nausea, vomiting, urinary or bowel complaints. Otherwise ROS negative    Objective   Discharge Exam: Vitals:   09/19/20 1222 09/19/20 1356  BP: 125/61 110/89  Pulse: 87 88  Resp: 15 20  Temp:    SpO2: 97% 96%   Vitals:   09/19/20 0936 09/19/20 1045 09/19/20 1222 09/19/20 1356  BP: 115/72 125/81 125/61 110/89  Pulse: 84 84 87 88  Resp: 16 16 15 20   Temp:      TempSrc:      SpO2: 97% 97% 97% 96%  Weight:      Height:        Physical Exam Vitals and nursing note reviewed.  Constitutional:      Appearance: Normal appearance.  HENT:     Head: Normocephalic and atraumatic.  Eyes:     Conjunctiva/sclera: Conjunctivae normal.  Cardiovascular:     Rate and Rhythm: Normal rate and regular rhythm.  Pulmonary:     Effort: Pulmonary effort is normal.     Breath sounds: Normal breath sounds.  Abdominal:     General: Abdomen is flat.     Palpations: Abdomen is soft.  Musculoskeletal:        General: No swelling or tenderness.  Skin:    Coloration: Skin is not jaundiced or pale.  Neurological:     General: No focal deficit present.     Mental Status: She is alert and oriented to person, place, and time. Mental status is at baseline.  Psychiatric:        Mood and Affect: Mood normal.        Behavior: Behavior normal.       The results of significant diagnostics from this hospitalization (including  imaging, microbiology, ancillary and laboratory) are listed below for reference.     Microbiology: Recent Results (from the past 240 hour(s))  Resp Panel by RT-PCR (Flu A&B, Covid) Urine, Clean Catch     Status: Abnormal   Collection Time: 09/19/20  7:53 AM   Specimen: Urine, Clean Catch; Nasopharyngeal(NP) swabs in vial transport medium  Result Value Ref Range Status   SARS Coronavirus 2 by RT PCR POSITIVE (A) NEGATIVE Final    Comment: CRITICAL RESULT CALLED TO, READ BACK BY AND VERIFIED WITH: WEST, S. RN @1128  ON 11.30.2021 BY NMCCOY (NOTE) SARS-CoV-2 target nucleic acids are DETECTED.  The SARS-CoV-2 RNA is generally detectable in upper respiratory specimens during the acute phase of infection. Positive results are indicative of the presence of the identified virus, but do not rule out  bacterial infection or co-infection with other pathogens not detected by the test. Clinical correlation with patient history and other diagnostic information is necessary to determine patient infection status. The expected result is Negative.  Fact Sheet for Patients: EntrepreneurPulse.com.au  Fact Sheet for Healthcare Providers: IncredibleEmployment.be  This test is not yet approved or cleared by the [place] FDA and  has been authorized for detection and/or diagnosis of SARS-CoV-2 by FDA under an Emergency Use Authorization (EUA).  This EUA will remain in effect (meaning  this test can be used) for the duration of  the COVID-19 declaration under Section 564(b)(1) of the Act, 21 U.S.C. section 360bbb-3(b)(1), unless the authorization is terminated or revoked sooner.     Influenza A by PCR NEGATIVE NEGATIVE Final   Influenza B by PCR NEGATIVE NEGATIVE Final    Comment: (NOTE) The Xpert Xpress SARS-CoV-2/FLU/RSV plus assay is intended as an aid in the diagnosis of influenza from Nasopharyngeal swab specimens and should not be used as a sole basis for  treatment. Nasal washings and aspirates are unacceptable for Xpert Xpress SARS-CoV-2/FLU/RSV testing.  Fact Sheet for Patients: EntrepreneurPulse.com.au  Fact Sheet for Healthcare Providers: IncredibleEmployment.be  This test is not yet approved or cleared by the [place] FDA and has been authorized for detection and/or diagnosis of SARS-CoV-2 by FDA under an Emergency Use Authorization (EUA). This EUA will remain in effect (meaning this test can be used) for the duration of the COVID-19 declaration under Section 564(b)(1) of the Act, 21 U.S.C. section 360bbb-3(b)(1), unless the authorization is terminated or revoked.  Performed at [facility], [place] [address] [place], [place] [address]      Labs: BNP (last 3 results) No results for input(s): BNP in the last 8760 hours. Basic Metabolic Panel: Recent Labs  Lab 09/18/20 2342  NA 136  K 4.7  CL 99  CO2 28  GLUCOSE 113*  BUN 16  CREATININE 0.96  CALCIUM 9.0   Liver Function Tests: Recent Labs  Lab 09/18/20 2342  AST 23  ALT 15  ALKPHOS 68  BILITOT 0.3  PROT 6.7  ALBUMIN 3.8   No results for input(s): LIPASE, AMYLASE in the last 168 hours. No results for input(s): AMMONIA in the last 168 hours. CBC: Recent Labs  Lab 09/18/20 2342  WBC 4.1  HGB 12.9  HCT 40.4  MCV 97.6  PLT 194   Cardiac Enzymes: No results for input(s): CKTOTAL, CKMB, CKMBINDEX, TROPONINI in the last 168 hours. BNP: Invalid input(s): POCBNP CBG: No results for input(s): GLUCAP in the last 168 hours. D-Dimer No results for input(s): DDIMER in the last 72 hours. Hgb A1c Recent Labs    09/19/20 0539  HGBA1C 5.3   Lipid Profile Recent Labs    09/19/20 0802  CHOL 124  HDL 49  LDLCALC 61  TRIG 70  CHOLHDL 2.5   Thyroid function studies No results for input(s): TSH, T4TOTAL, T3FREE, THYROIDAB in the last 72 hours.  Invalid input(s): FREET3 Anemia work up No  results for input(s): VITAMINB12, FOLATE, FERRITIN, TIBC, IRON, RETICCTPCT in the last 72 hours. Urinalysis    Component Value Date/Time   COLORURINE YELLOW 09/19/2020 0426   APPEARANCEUR CLEAR 09/19/2020 0426   LABSPEC 1.013 09/19/2020 0426   PHURINE 5.0 09/19/2020 0426   GLUCOSEU NEGATIVE 09/19/2020 0426   HGBUR NEGATIVE 09/19/2020 0426   BILIRUBINUR NEGATIVE 09/19/2020 0426   KETONESUR NEGATIVE 09/19/2020 0426   PROTEINUR NEGATIVE 09/19/2020 0426   UROBILINOGEN 0.2 11/06/2012 1034  NITRITE POSITIVE (A) 09/19/2020 0426   LEUKOCYTESUR NEGATIVE 09/19/2020 0426   Sepsis Labs Invalid input(s): PROCALCITONIN,  WBC,  LACTICIDVEN Microbiology Recent Results (from the past 240 hour(s))  Resp Panel by RT-PCR (Flu A&B, Covid) Urine, Clean Catch     Status: Abnormal   Collection Time: 09/19/20  7:53 AM   Specimen: Urine, Clean Catch; Nasopharyngeal(NP) swabs in vial transport medium  Result Value Ref Range Status   SARS Coronavirus 2 by RT PCR POSITIVE (A) NEGATIVE Final    Comment: CRITICAL RESULT CALLED TO, READ BACK BY AND VERIFIED WITH: WEST, S. RN @1128  ON 11.30.2021 BY NMCCOY (NOTE) SARS-CoV-2 target nucleic acids are DETECTED.  The SARS-CoV-2 RNA is generally detectable in upper respiratory specimens during the acute phase of infection. Positive results are indicative of the presence of the identified virus, but do not rule out bacterial infection or co-infection with other pathogens not detected by the test. Clinical correlation with patient history and other diagnostic information is necessary to determine patient infection status. The expected result is Negative.  Fact Sheet for Patients: EntrepreneurPulse.com.au  Fact Sheet for Healthcare Providers: IncredibleEmployment.be  This test is not yet approved or cleared by the [place] FDA and  has been authorized for detection and/or diagnosis of SARS-CoV-2 by FDA under an Emergency  Use Authorization (EUA).  This EUA will remain in effect (meaning  this test can be used) for the duration of  the COVID-19 declaration under Section 564(b)(1) of the Act, 21 U.S.C. section 360bbb-3(b)(1), unless the authorization is terminated or revoked sooner.     Influenza A by PCR NEGATIVE NEGATIVE Final   Influenza B by PCR NEGATIVE NEGATIVE Final    Comment: (NOTE) The Xpert Xpress SARS-CoV-2/FLU/RSV plus assay is intended as an aid in the diagnosis of influenza from Nasopharyngeal swab specimens and should not be used as a sole basis for treatment. Nasal washings and aspirates are unacceptable for Xpert Xpress SARS-CoV-2/FLU/RSV testing.  Fact Sheet for Patients: EntrepreneurPulse.com.au  Fact Sheet for Healthcare Providers: IncredibleEmployment.be  This test is not yet approved or cleared by the [place] FDA and has been authorized for detection and/or diagnosis of SARS-CoV-2 by FDA under an Emergency Use Authorization (EUA). This EUA will remain in effect (meaning this test can be used) for the duration of the COVID-19 declaration under Section 564(b)(1) of the Act, 21 U.S.C. section 360bbb-3(b)(1), unless the authorization is terminated or revoked.  Performed at [facility], [place] [address], [place], [place] [address]     Discharge Instructions     Discharge Instructions    Diet - low sodium heart healthy   Complete by: As directed    Discharge instructions   Complete by: As directed    -Refrain from any THC use -You tested positive for COVID-19 today, 09/19/2020, and you are fortunately asymptomatic.  You will still need to self isolate for 10 days from positive test per CDC guidelines (end isolation on 12/9). -Follow-up with your primary care physician within the next week -If you have any recurrence of your symptoms or new onset shortness of breath do not hesitate to contact your primary care  physician or return to the ED   Increase activity slowly   Complete by: As directed      Allergies as of 09/19/2020   No Known Allergies     Medication List    STOP taking these medications   omeprazole 40 MG capsule Commonly known as: PRILOSEC     TAKE  these medications   buPROPion 300 MG 24 hr tablet Commonly known as: WELLBUTRIN XL Take 300 mg by mouth daily before breakfast.   cyclobenzaprine 5 MG tablet Commonly known as: FLEXERIL Take 5 mg by mouth 3 (three) times daily as needed for muscle spasms.   donepezil 10 MG tablet Commonly known as: ARICEPT Take 10 mg by mouth at bedtime.   DULoxetine 60 MG capsule Commonly known as: CYMBALTA Take 60 mg by mouth daily.   ezetimibe 10 MG tablet Commonly known as: ZETIA Take 10 mg by mouth daily.   gabapentin 300 MG capsule Commonly known as: NEURONTIN TAKE 1 CAPSULE(300 MG) BY MOUTH AT BEDTIME What changed: See the new instructions.   Myrbetriq 50 MG Tb24 tablet Generic drug: mirabegron ER Take 50 mg by mouth daily.   omeprazole 20 MG tablet Commonly known as: PRILOSEC OTC Take 20 mg by mouth daily.   ondansetron 8 MG disintegrating tablet Commonly known as: ZOFRAN-ODT Take 8 mg by mouth every 8 (eight) hours as needed for nausea/vomiting.   raloxifene 60 MG tablet Commonly known as: EVISTA Take 60 mg by mouth daily.   simvastatin 20 MG tablet Commonly known as: ZOCOR Take 20 mg by mouth every other day.   triamterene-hydrochlorothiazide 37.5-25 MG tablet Commonly known as: MAXZIDE-25 Take 1 tablet by mouth daily in the afternoon.   vitamin B-12 1000 MCG tablet Commonly known as: CYANOCOBALAMIN Take 1,000 mcg by mouth daily.   Vitamin D 125 MCG (5000 UT) Caps Take 5,000 Units by mouth daily.       No Known Allergies  Dispo: The patient is from: Home              Anticipated d/c is to: Home                       Patient currently is medically stable to d/c.       Time coordinating  discharge: Over 30 minutes   SIGNED:   Harold Hedge, D.O. [facility] Pager: [phone]  09/19/2020, 2:01 PM

## 2020-09-19 NOTE — ED Provider Notes (Signed)
[facility] DEPT Provider Note   CSN: [id number] Arrival date & time: 09/18/20  2326     History Chief Complaint  Patient presents with  . Altered Mental Status    Colleen Glenn is a 79 y.o. female with a history of osteoarthritis, peptic ulcer disease, diverticulosis, BPPV, hyperlipidemia, hypertension who is accompanied to the emergency department by her daughter with a chief complaint of altered mental status.  The patient's daughter reports that she seemed a call from her brother at approximately 2000 with concern for altered mental status and the patient.  The patient was on speaker phone at that time and her daughter noted that she seemed very confused.  The patient stated that she was seeing things on the wall.  She was also seeing objects that were not actually there.  She was repeating that she saw a banana and her Gatorade and on the drive to the ER she was seeing big red Eyes that were actually the car in front of her vehicle.  She notes that around the time that the symptoms began that she also noticed that her tongue seemed very thick and dry.  The symptoms seem to come and go and with onset of symptoms she had a difficult time getting out her words.  Family noted that her words seemed somewhat slurred at this time.  Her daughter reports that she checked her smile when she arrived to her home and noted that her smile was symmetric.  Her daughter also reports that at baseline the patient has walked with a walker for several months.  After symptoms began tonight, the patient was unable to stand and ambulate with her walker.  Her daughter drove her from [place] to the ER tonight.  She reports that the patient's confusion resolved while she was waiting in the ER and she is now back to her baseline, but they have not attempted to ambulate the patient.   She does report that she has noticed some upper abdominal pain along with her symptoms.  She is  unable to characterize the pain.  She has also had a nonproductive cough over the last few days, but denies shortness of breath or leg swelling.   About a week ago, the patient was having intermittent episodes of nausea and vomiting accompanied by upper abdominal pain.  Her daughter noted that the symptoms seem similar to when she had previously had gallbladder attacks and an outpatient right upper quadrant ultrasound demonstrated an obstructive stone in the common bile duct.  They are awaiting to be scheduled for a cholecystectomy.  She reports that vomiting has resolved over the last few days.  The patient denies headache, dizziness, lightheadedness, numbness, fevers, chills, rash, dysuria, hematuria, vaginal discharge, back pain, flank pain, neck pain or stiffness.  The patient lives at home alone.  The patient's son lives within a mile of her home and her daughter lives in [place].  She did undergo shoulder surgery in September 2021.  No other recent immobilization.  No recent long travel.  She reports occasional alcohol use, but none tonight.  She is a non-smoker.  No illicit or recreational substance use.  The history is provided by the patient and a relative. No language interpreter was used.       Past Medical History:  Diagnosis Date  . Anxiety    on meds  . Arthritis    OA bil shoulders, hands, back  . Back pain   . BPPV (  benign paroxysmal positional vertigo) 10/2019  . Bursitis   . Colon polyps    adenomatous  . Colon polyps   . Depression    on meds  . Diverticulosis   . GERD (gastroesophageal reflux disease)    on meds  . Hemorrhoids   . Hyperlipidemia    on meds  . Mild hypertension    white coat syndrome- on meds  . OAB (overactive bladder)   . Osteopenia   . Panic disorder 11/2012  . Plantar fasciitis    hx of  . PUD (peptic ulcer disease)   . Shingles   . Tremor    mild action tremor suspected 2020  . Vitamin D deficiency    on meds     Patient Active Problem List   Diagnosis Date Noted  . Primary osteoarthritis of right knee 03/22/2020  . GERD (gastroesophageal reflux disease)   . GAD (generalized anxiety disorder) 10/31/2019  . Memory loss 10/31/2019  . Postop Hyponatremia 11/18/2012  . Postop Acute blood loss anemia 11/18/2012  . OA (osteoarthritis) of hip 11/16/2012  . NAUSEA 08/01/2009  . HEMORRHOIDS-INTERNAL 07/31/2009  . HEMORRHOIDS-EXTERNAL 07/31/2009  . DIVERTICULOSIS-COLON 07/31/2009  . RECTAL BLEEDING 07/31/2009  . NAUSEA WITH VOMITING 07/31/2009  . Abdominal pain, epigastric 07/31/2009  . PERSONAL HX COLONIC POLYPS 07/31/2009    Past Surgical History:  Procedure Laterality Date  . ABDOMINAL HYSTERECTOMY    . APPENDECTOMY    . BACK SURGERY    . COLONOSCOPY  2016   JP-MAC-TA  . FOOT SURGERY     bilateral   . HEMORRHOID SURGERY    . JOINT REPLACEMENT Right 2014  . laproscopic knee surgery Right    2018  . LUMBAR LAMINECTOMY/DECOMPRESSION MICRODISCECTOMY  05/07/2012   Procedure: LUMBAR LAMINECTOMY/DECOMPRESSION MICRODISCECTOMY 1 LEVEL;  Surgeon: Eustace [place], MD;  Location: [facility];  Service: Neurosurgery;  Laterality: Right;  Lumbar Laminectomy Decompression Microdiscectomy Lumbar Three-Four  . multiple foot surgeries    . POLYPECTOMY  2016   TA  . REVERSE SHOULDER ARTHROPLASTY Left 07/12/2020   Procedure: REVERSE SHOULDER ARTHROPLASTY;  Surgeon: Hiram Gash, MD;  Location: [facility];  Service: Orthopedics;  Laterality: Left;  . ROTATOR CUFF REPAIR     right side  . TONSILLECTOMY    . TOTAL HIP ARTHROPLASTY  11/16/2012   Procedure: TOTAL HIP ARTHROPLASTY;  Surgeon: Gearlean Alf, MD;  Location: [facility];  Service: Orthopedics;  Laterality: Right;  Right Total Hip Arthroplasty  . TUBAL LIGATION    . VAGOTOMY     approx. 35 years ago, states has a clamp mid epigastric area     OB History   No obstetric history on file.     Family History  Problem Relation Age  of Onset  . Colon polyps Mother   . Stroke Mother   . Hypertension Mother   . Congestive Heart Failure Mother   . Stroke Other        several family members on mother's side  . Lung cancer Father 52  . Heart disease Father   . Colon cancer Neg Hx   . Rectal cancer Neg Hx   . Stomach cancer Neg Hx   . Esophageal cancer Neg Hx     Social History   Tobacco Use  . Smoking status: Former Smoker    Packs/day: 1.00    Years: 20.00    Pack years: 20.00    Types: Cigarettes    Quit date: 1983  Years since quitting: 38.9  . Smokeless tobacco: Never Used  . Tobacco comment: 45 years ago  Vaping Use  . Vaping Use: Never used  Substance Use Topics  . Alcohol use: Yes    Alcohol/week: 2.0 - 4.0 standard drinks    Types: 2 - 4 Glasses of wine per week    Comment: every 3-4 days  . Drug use: No    Home Medications Prior to Admission medications   Medication Sig Start Date End Date Taking? Authorizing Provider  buPROPion (WELLBUTRIN XL) 300 MG 24 hr tablet Take 300 mg by mouth daily before breakfast.    Yes [provider]  Cholecalciferol (VITAMIN D) 125 MCG (5000 UT) CAPS Take 5,000 Units by mouth daily.   Yes [provider]  cyclobenzaprine (FLEXERIL) 5 MG tablet Take 5 mg by mouth 3 (three) times daily as needed for muscle spasms.   Yes [provider]  donepezil (ARICEPT) 10 MG tablet Take 10 mg by mouth at bedtime.   Yes [provider]  DULoxetine (CYMBALTA) 60 MG capsule Take 60 mg by mouth daily. 10/25/19  Yes [provider]  ezetimibe (ZETIA) 10 MG tablet Take 10 mg by mouth daily. 11/18/19  Yes [provider]  gabapentin (NEURONTIN) 300 MG capsule TAKE 1 CAPSULE(300 MG) BY MOUTH AT BEDTIME Patient taking differently: Take 300 mg by mouth at bedtime.  07/11/20  Yes Melvenia Beam, MD  MYRBETRIQ 50 MG TB24 tablet Take 50 mg by mouth daily. 03/06/20  Yes [provider]  omeprazole (PRILOSEC OTC) 20 MG tablet  Take 20 mg by mouth daily.   Yes [provider]  ondansetron (ZOFRAN-ODT) 8 MG disintegrating tablet Take 8 mg by mouth every 8 (eight) hours as needed for nausea/vomiting. 09/13/20  Yes [provider]  raloxifene (EVISTA) 60 MG tablet Take 60 mg by mouth daily.   Yes [provider]  simvastatin (ZOCOR) 20 MG tablet Take 20 mg by mouth every other day. 07/30/20  Yes [provider]  triamterene-hydrochlorothiazide (MAXZIDE-25) 37.5-25 MG per tablet Take 1 tablet by mouth daily in the afternoon.    Yes [provider]  vitamin B-12 (CYANOCOBALAMIN) 1000 MCG tablet Take 1,000 mcg by mouth daily.   Yes [provider]  omeprazole (PRILOSEC) 40 MG capsule TAKE 1 CAPSULE BY MOUTH EVERY DAY; patient needs office visit for further refills Patient not taking: Reported on 09/19/2020 10/04/19   Irene Shipper, MD    Allergies    Patient has no known allergies.  Review of Systems   Review of Systems  Constitutional: Negative for activity change, chills and fever.  HENT: Negative for congestion, sinus pressure, sinus pain, sore throat and trouble swallowing.   Eyes: Negative for visual disturbance.  Respiratory: Positive for cough. Negative for shortness of breath and wheezing.   Cardiovascular: Positive for chest pain. Negative for palpitations and leg swelling.  Gastrointestinal: Positive for abdominal pain, nausea (last week) and vomiting (last week). Negative for blood in stool and constipation.  Genitourinary: Negative for dysuria, frequency, urgency and vaginal discharge.  Musculoskeletal: Negative for back pain, myalgias, neck pain and neck stiffness.  Skin: Negative for rash and wound.  Allergic/Immunologic: Negative for immunocompromised state.  Neurological: Positive for weakness. Negative for dizziness, seizures, syncope, speech difficulty, numbness and headaches.  Psychiatric/Behavioral: Negative for confusion.    Physical  Exam Updated Vital Signs BP 108/70   Pulse 88   Temp 98.7 F (37.1 C) (Oral)   Resp  18   Ht _0  (1.676 m)   Wt 70.8 kg   SpO2 98%   BMI 25.18 kg/m   Physical Exam Vitals and nursing note reviewed.  Constitutional:      General: She is not in acute distress.    Appearance: She is not ill-appearing, toxic-appearing or diaphoretic.  HENT:     Head: Normocephalic.     Mouth/Throat:     Mouth: Mucous membranes are moist.     Pharynx: No oropharyngeal exudate or posterior oropharyngeal erythema.  Eyes:     General: No scleral icterus.    Extraocular Movements: Extraocular movements intact.     Conjunctiva/sclera: Conjunctivae normal.     Pupils: Pupils are equal, round, and reactive to light.  Cardiovascular:     Rate and Rhythm: Normal rate and regular rhythm.     Pulses: Normal pulses.     Heart sounds: Normal heart sounds. No murmur heard.  No friction rub. No gallop.   Pulmonary:     Effort: Pulmonary effort is normal. No respiratory distress.     Breath sounds: No stridor. No wheezing, rhonchi or rales.  Chest:     Chest wall: No tenderness.  Abdominal:     General: There is no distension.     Palpations: Abdomen is soft.     Tenderness: There is abdominal tenderness. There is no right CVA tenderness or left CVA tenderness.     Comments: Minimal tenderness to palpation in the epigastric region and right upper quadrant.  Negative Murphy sign.  Abdomen is soft and nondistended.  No rebound or guarding.  No CVA tenderness bilaterally.  Normoactive bowel sounds.  Musculoskeletal:        General: No tenderness.     Cervical back: Normal range of motion and neck supple.     Right lower leg: No edema.     Left lower leg: No edema.  Skin:    General: Skin is warm.     Capillary Refill: Capillary refill takes less than 2 seconds.     Coloration: Skin is not jaundiced or pale.     Findings: No rash.  Neurological:     Mental Status: She is alert.     Comments: Alert and  oriented x4.  Cranial nerves II through XII are grossly intact.  Patient is able to speak in complete, fluent sentences.  No dysarthria.  5 out of 5 strength against resistance of the bilateral upper and lower extremities.  Sensation is intact and equal throughout.  No pronator drift.  Answers questions appropriately.  Finger-to-nose is intact bilaterally.  Gait exam is deferred at this time.  Psychiatric:        Behavior: Behavior normal.     ED Results / Procedures / Treatments   Labs (all labs ordered are listed, but only abnormal results are displayed) Labs Reviewed  COMPREHENSIVE METABOLIC PANEL - Abnormal; Notable for the following components:      Result Value   Glucose, Bld 113 (*)    All other components within normal limits  URINALYSIS, ROUTINE W REFLEX MICROSCOPIC - Abnormal; Notable for the following components:   Nitrite POSITIVE (*)    Bacteria, UA RARE (*)    All other components within normal limits  URINE CULTURE  RESP PANEL BY RT-PCR (FLU A&B, COVID) ARPGX2  CBC  RAPID URINE DRUG SCREEN, HOSP PERFORMED  TROPONIN I (HIGH SENSITIVITY)  TROPONIN I (HIGH SENSITIVITY)    EKG EKG Interpretation  Date/Time:  Tuesday September 19 2020 05:35:18 EST Ventricular Rate:  87 PR Interval:    QRS Duration: 85 QT Interval:  377 QTC Calculation: 149 R Axis:   6 Text Interpretation: Sinus rhythm Atrial premature complexes in couplets Consider left atrial enlargement Left ventricular hypertrophy When compared with ECG of 05/05/2012, No significant change was found Confirmed by Delora Fuel ([id number]) on 09/19/2020 7:17:23 AM   Radiology CT Head Wo Contrast  Result Date: 09/19/2020 CLINICAL DATA:  Altered mental status EXAM: CT HEAD WITHOUT CONTRAST TECHNIQUE: Contiguous axial images were obtained from the base of the skull through the vertex without intravenous contrast. COMPARISON:  10/20/2019 brain MRI FINDINGS: Brain: No evidence of acute infarction, hemorrhage, hydrocephalus,  extra-axial collection or mass lesion/mass effect. Mild chronic small vessel ischemia in the cerebral white matter. Age congruent cerebral volume loss. Vascular: Atheromatous calcification. Skull: Normal. Negative for fracture or focal lesion. Sinuses/Orbits: No acute finding. IMPRESSION: Senescent changes without acute or reversible finding. Electronically Signed   By: Monte Fantasia M.D.   On: 09/19/2020 06:25   DG Chest Portable 1 View  Result Date: 09/19/2020 CLINICAL DATA:  Cough.  Altered mental status. EXAM: PORTABLE CHEST 1 VIEW COMPARISON:  05/05/2012.  12/05/2009. FINDINGS: Mediastinum and hilar structures normal. Low lung volumes. Right perihilar atelectasis/infiltrate. Follow-up exams suggested to demonstrate resolution and to exclude underlying mass. Mild left base subsegmental atelectasis. No pleural effusion or pneumothorax. Prior left shoulder replacement. Surgical clips upper abdomen. IMPRESSION: 1. Low lung volumes. Right perihilar atelectasis/infiltrate. Follow-up exams suggested to demonstrate resolution and to exclude underlying mass. 2. Mild left base subsegmental atelectasis. Electronically Signed   By: Marcello Moores  Register   On: 09/19/2020 05:50    Procedures Procedures (including critical care time)  Medications Ordered in ED Medications  LORazepam (ATIVAN) injection 0.5 mg (has no administration in time range)  cefTRIAXone (ROCEPHIN) 1 g in sodium chloride 0.9 % 100 mL IVPB (has no administration in time range)    ED Course  I have reviewed the triage vital signs and the nursing notes.  Pertinent labs & imaging results that were available during my care of the patient were reviewed by me and considered in my medical decision making (see chart for details).    MDM Rules/Calculators/A&P                          This is a 79 y.o. female who presents to the ED for concern of altered mental status.   This involves an extensive number of treatment options, and is a  complaint that carries with it a high risk of complications and morbidity.  The differential diagnosis includes sepsis, TIA, CVA, hepatic encephalopathy, cholecystitis, UTI, pyelonephritis, or psychosis.   The patient was seen and independently evaluated by Dr. Roxanne Mins, attending physician.  Vitals and Exam:    Vital signs are stable.  Abdomen is benign.  No acute neurologic abnormalities.   Lab Tests:    I ordered, reviewed, and interpreted labs, which included  CBC, troponin, metabolic panel, urinalysis  UA with nitrites.  She is having no urinary complaints.  We will send urine culture.  No metabolic derangements.  Hepatic function panel is normal.  She does not appear clinically dehydrated.  Troponin is not elevated.  EKG with no changes from previous.  Imaging Studies ordered:    I ordered imaging studies which included CT head and chest x-ray  I independently visualized and interpreted imaging which showed no acute findings on CT head.  Chest x-ray with right perihilar atelectasis or infiltrate.  Follow-up exams recommended to exclude underlying mass.  She may benefit from a CT chest.   Additional history obtained:    Previous records obtained and reviewed  Medicines ordered:    I ordered Rocephin given nitrites on UA.  As needed Ativan has been ordered prior to MRI imaging.   Consultations Obtained:    I consulted  the hospitalist team and Dr. Neysa Bonito will accept the patient for admission.   Reevaluation:   After the interventions stated above, I reevaluated the patient and found improved  Plan and Disposition:   The patient appears reasonably stabilized for admission considering the current resources, flow, and capabilities available in the ED at this time, and I doubt any other [facility] requiring further screening and/or treatment in the ED prior to admission.  Final Clinical Impression(s) / ED Diagnoses Final diagnoses:  None    Rx / DC Orders ED Discharge  Orders    None       Joanne Gavel, PA-C 48/01/65 [id number]    Delora Fuel, MD 82/70/78 [phone]

## 2020-09-19 NOTE — TOC Initial Note (Signed)
Transition of Care [facility]) - Initial/Assessment Note    Patient Details  Name: Colleen Glenn MRN: [id number] Date of Birth: Oct 01, 1941  Transition [facility]) CM/SW Contact:    Erenest Rasher, RN Phone Number: [phone] 09/19/2020, 1:46 PM  Clinical Narrative:                 TOC CM spoke to pt's dtr, States pt is ready for dc. She has been up walking. She will be assisting her at home. Pt has RW at home. Declined HH. Explained importance of follow up with PCP in 1-2 weeks post dc.   Expected Discharge Plan: Home/Self Care Barriers to Discharge: No Barriers Identified   Patient Goals and CMS Choice Patient states their goals for this hospitalization and ongoing recovery are:: daughter feels pt is back to her baseline CMS Medicare.gov Compare Post Acute Care list provided to:: Patient Represenative (must comment) Colleen Glenn) Choice offered to / list presented to : Adult Children  Expected Discharge Plan and Services Expected Discharge Plan: Home/Self Care In-house Referral: Clinical Social Work Discharge Planning Services: CM Consult   Living arrangements for the past 2 months: Single Family Home                                      Prior Living Arrangements/Services Living arrangements for the past 2 months: Single Family Home Lives with:: Adult Children Patient language and need for interpreter reviewed:: Yes Do you feel safe going back to the place where you live?: Yes      Need for Family Participation in Patient Care: Yes (Comment) Care giver support system in place?: Yes (comment) Current home services: DME (rolling walker) Criminal Activity/Legal Involvement Pertinent to Current Situation/Hospitalization: No - Comment as needed  Activities of Daily Living Home Assistive Devices/Equipment: Eyeglasses, Environmental consultant (specify type), Cane (specify quad or straight) (4 wheeled walker, single point cane) ADL Screening (condition at time of  admission) Patient's cognitive ability adequate to safely complete daily activities?: Yes Is the patient deaf or have difficulty hearing?: No Does the patient have difficulty seeing, even when wearing glasses/contacts?: No Does the patient have difficulty concentrating, remembering, or making decisions?: Yes Patient able to express need for assistance with ADLs?: Yes Does the patient have difficulty dressing or bathing?: No Independently performs ADLs?: No Communication: Independent Dressing (OT): Independent Grooming: Independent Feeding: Independent Bathing: Independent Toileting: Independent with device (comment) In/Out Bed: Independent Walks in Home: Independent with device (comment) Does the patient have difficulty walking or climbing stairs?: Yes Weakness of Legs: Both Weakness of Arms/Hands: None  Permission Sought/Granted Permission sought to share information with : Case Manager, PCP, Family Supports Permission granted to share information with : Yes, Verbal Permission Granted  Share Information with NAME: Colleen Glenn     Permission granted to share info w Relationship: daughter     Emotional Assessment           Psych Involvement: No (comment)  Admission diagnosis:  AMS (altered mental status) [R41.82] Patient Active Problem List   Diagnosis Date Noted  . AMS (altered mental status) 09/19/2020  . Primary osteoarthritis of right knee 03/22/2020  . GERD (gastroesophageal reflux disease)   . GAD (generalized anxiety disorder) 10/31/2019  . Memory loss 10/31/2019  . Postop Hyponatremia 11/18/2012  . Postop Acute blood loss anemia 11/18/2012  . OA (osteoarthritis) of hip 11/16/2012  . NAUSEA 08/01/2009  .  HEMORRHOIDS-INTERNAL 07/31/2009  . HEMORRHOIDS-EXTERNAL 07/31/2009  . DIVERTICULOSIS-COLON 07/31/2009  . RECTAL BLEEDING 07/31/2009  . NAUSEA WITH VOMITING 07/31/2009  . Abdominal pain, epigastric 07/31/2009  . PERSONAL HX COLONIC POLYPS 07/31/2009   PCP:   Crist Infante, MD Pharmacy:   [facility], [address] [place] [address] Phone: [phone] Fax: [phone]  [place] - [address] [place] [address] Phone: [phone] Fax: [phone]  [facility], [place] [address] Phone: [phone] Fax: [phone]     Social Determinants of Health (SDOH) Interventions    Readmission Risk Interventions No flowsheet data found.

## 2020-09-21 ENCOUNTER — Telehealth: Payer: Self-pay | Admitting: Nurse Practitioner

## 2020-09-21 NOTE — Telephone Encounter (Signed)
Called to Discuss with patient about Covid symptoms and the use of the monoclonal antibody infusion for those with mild to moderate Covid symptoms and at a high risk of hospitalization.     Pt appears to qualify for this infusion due to co-morbid conditions and/or a member of an at-risk group in accordance with the FDA Emergency Use Authorization. (BMI >25/age)   Unable to reach pt. Voicemail left and My Chart message sent.   Alda Lea, NP [facility]  [phone]

## 2020-09-22 DIAGNOSIS — Z1212 Encounter for screening for malignant neoplasm of rectum: Secondary | ICD-10-CM | POA: Diagnosis not present

## 2020-09-22 LAB — URINE CULTURE: Culture: >=100000 — AB

## 2020-09-23 ENCOUNTER — Telehealth (HOSPITAL_BASED_OUTPATIENT_CLINIC_OR_DEPARTMENT_OTHER): Payer: Self-pay | Admitting: Emergency Medicine

## 2020-09-23 NOTE — Telephone Encounter (Signed)
Post ED Visit - Positive Culture Follow-up  Culture report reviewed by antimicrobial stewardship pharmacist: [place] Team []  Elenor Quinones, Pharm.D. []  Heide Guile, Pharm.D., BCPS AQ-ID []  Parks Neptune, Pharm.D., BCPS []  Alycia Rossetti, Pharm.D., BCPS []  [place], Pharm.D., BCPS, AAHIVP []  Legrand Como, Pharm.D., BCPS, AAHIVP []  Salome Arnt, PharmD, BCPS []  Johnnette Gourd, PharmD, BCPS []  Hughes Better, PharmD, BCPS []  Leeroy Cha, PharmD []  Laqueta Linden, PharmD, BCPS []  Albertina Parr, PharmD  [facility] Team [x]  Leodis Sias, PharmD []  Lindell Spar, PharmD []  Royetta Asal, PharmD []  Graylin Shiver, Rph []  Rema Fendt) Glennon Mac, PharmD []  Arlyn Dunning, PharmD []  Netta Cedars, PharmD []  Dia Sitter, PharmD []  Leone Haven, PharmD []  Gretta Arab, PharmD []  Theodis Shove, PharmD []  Peggyann Juba, PharmD []  Reuel Boom, PharmD   Positive urine culture No further patient follow-up is required at this time. Britni Henderly PA  Qiara Minetti C Devantae Babe 09/23/2020, 2:53 PM

## 2020-09-23 NOTE — Progress Notes (Signed)
ED Antimicrobial Stewardship Positive Culture Follow Up   Colleen Glenn is an 79 y.o. female who presented to [facility] on 09/19/2020 with a chief complaint of  Chief Complaint  Patient presents with  . Altered Mental Status    Recent Results (from the past 720 hour(s))  Urine culture     Status: Abnormal   Collection Time: 09/19/20  4:40 AM   Specimen: Urine, Random  Result Value Ref Range Status   Specimen Description   Final    URINE, RANDOM Performed at [place] [address], [place], [place] [address]    Special Requests   Final    NONE Performed at [facility], [place] [address] [place], [place] [address]    Culture >=100,000 COLONIES/mL KLEBSIELLA PNEUMONIAE (A)  Final   Report Status 09/22/2020 FINAL  Final   Organism ID, Bacteria KLEBSIELLA PNEUMONIAE (A)  Final      Susceptibility   Klebsiella pneumoniae - MIC*    AMPICILLIN >=32 RESISTANT Resistant     CEFAZOLIN >=64 RESISTANT Resistant     CEFEPIME >=32 RESISTANT Resistant     CEFTRIAXONE 32 RESISTANT Resistant     CIPROFLOXACIN <=0.25 SENSITIVE Sensitive     GENTAMICIN <=1 SENSITIVE Sensitive     IMIPENEM 1 SENSITIVE Sensitive     NITROFURANTOIN <=16 SENSITIVE Sensitive     TRIMETH/SULFA <=20 SENSITIVE Sensitive     AMPICILLIN/SULBACTAM <=2 SENSITIVE Sensitive     PIP/TAZO <=4 SENSITIVE Sensitive     * >=100,000 COLONIES/mL KLEBSIELLA PNEUMONIAE  Resp Panel by RT-PCR (Flu A&B, Covid) Urine, Clean Catch     Status: Abnormal   Collection Time: 09/19/20  7:53 AM   Specimen: Urine, Clean Catch; Nasopharyngeal(NP) swabs in vial transport medium  Result Value Ref Range Status   SARS Coronavirus 2 by RT PCR POSITIVE (A) NEGATIVE Final    Comment: CRITICAL RESULT CALLED TO, READ BACK BY AND VERIFIED WITH: WEST, S. RN @1128  ON 11.30.2021 BY NMCCOY (NOTE) SARS-CoV-2 target nucleic acids are DETECTED.  The SARS-CoV-2 RNA is generally detectable in upper  respiratory specimens during the acute phase of infection. Positive results are indicative of the presence of the identified virus, but do not rule out bacterial infection or co-infection with other pathogens not detected by the test. Clinical correlation with patient history and other diagnostic information is necessary to determine patient infection status. The expected result is Negative.  Fact Sheet for Patients: EntrepreneurPulse.com.au  Fact Sheet for Healthcare Providers: IncredibleEmployment.be  This test is not yet approved or cleared by the [place] FDA and  has been authorized for detection and/or diagnosis of SARS-CoV-2 by FDA under an Emergency Use Authorization (EUA).  This EUA will remain in effect (meaning  this test can be used) for the duration of  the COVID-19 declaration under Section 564(b)(1) of the Act, 21 U.S.C. section 360bbb-3(b)(1), unless the authorization is terminated or revoked sooner.     Influenza A by PCR NEGATIVE NEGATIVE Final   Influenza B by PCR NEGATIVE NEGATIVE Final    Comment: (NOTE) The Xpert Xpress SARS-CoV-2/FLU/RSV plus assay is intended as an aid in the diagnosis of influenza from Nasopharyngeal swab specimens and should not be used as a sole basis for treatment. Nasal washings and aspirates are unacceptable for Xpert Xpress SARS-CoV-2/FLU/RSV testing.  Fact Sheet for Patients: EntrepreneurPulse.com.au  Fact Sheet for Healthcare Providers: IncredibleEmployment.be  This test is not yet approved or cleared by the [place] FDA and has been  authorized for detection and/or diagnosis of SARS-CoV-2 by FDA under an Emergency Use Authorization (EUA). This EUA will remain in effect (meaning this test can be used) for the duration of the COVID-19 declaration under Section 564(b)(1) of the Act, 21 U.S.C. section 360bbb-3(b)(1), unless the authorization is  terminated or revoked.  Performed at [facility], [place] [address] [place], [place] [address]    Assessment: asymptomatic bacturia  Plan: no treatment needed  ED Provider: B. Townsend Roger 09/23/2020, 10:43 AM Clinical Pharmacist [phone]

## 2020-09-26 ENCOUNTER — Ambulatory Visit: Payer: Medicare Other | Admitting: Nurse Practitioner

## 2020-09-28 ENCOUNTER — Inpatient Hospital Stay (HOSPITAL_COMMUNITY)
Admission: EM | Admit: 2020-09-28 | Discharge: 2020-10-03 | DRG: 287 | Disposition: A | Discharge status: Home Health | Payer: Medicare Other | Attending: Cardiovascular Disease | Admitting: Cardiovascular Disease

## 2020-09-28 ENCOUNTER — Encounter (HOSPITAL_COMMUNITY)
Admission: EM | Disposition: A | Discharge status: Home Health | Payer: Self-pay | Source: Home / Self Care | Attending: Cardiovascular Disease

## 2020-09-28 ENCOUNTER — Inpatient Hospital Stay (HOSPITAL_COMMUNITY): Payer: Medicare Other

## 2020-09-28 ENCOUNTER — Ambulatory Visit (HOSPITAL_COMMUNITY): Admit: 2020-09-28 | Payer: Medicare Other | Admitting: Cardiovascular Disease

## 2020-09-28 ENCOUNTER — Emergency Department (HOSPITAL_COMMUNITY): Payer: Medicare Other

## 2020-09-28 DIAGNOSIS — R0789 Other chest pain: Secondary | ICD-10-CM | POA: Diagnosis not present

## 2020-09-28 DIAGNOSIS — R1013 Epigastric pain: Secondary | ICD-10-CM | POA: Diagnosis not present

## 2020-09-28 DIAGNOSIS — Z801 Family history of malignant neoplasm of trachea, bronchus and lung: Secondary | ICD-10-CM

## 2020-09-28 DIAGNOSIS — R1084 Generalized abdominal pain: Secondary | ICD-10-CM | POA: Diagnosis not present

## 2020-09-28 DIAGNOSIS — I2510 Atherosclerotic heart disease of native coronary artery without angina pectoris: Secondary | ICD-10-CM | POA: Diagnosis present

## 2020-09-28 DIAGNOSIS — I25110 Atherosclerotic heart disease of native coronary artery with unstable angina pectoris: Secondary | ICD-10-CM

## 2020-09-28 DIAGNOSIS — E785 Hyperlipidemia, unspecified: Secondary | ICD-10-CM | POA: Diagnosis not present

## 2020-09-28 DIAGNOSIS — Z823 Family history of stroke: Secondary | ICD-10-CM | POA: Diagnosis not present

## 2020-09-28 DIAGNOSIS — R112 Nausea with vomiting, unspecified: Secondary | ICD-10-CM

## 2020-09-28 DIAGNOSIS — Z96612 Presence of left artificial shoulder joint: Secondary | ICD-10-CM | POA: Diagnosis present

## 2020-09-28 DIAGNOSIS — I5181 Takotsubo syndrome: Secondary | ICD-10-CM | POA: Diagnosis not present

## 2020-09-28 DIAGNOSIS — Z8616 Personal history of COVID-19: Secondary | ICD-10-CM

## 2020-09-28 DIAGNOSIS — K8020 Calculus of gallbladder without cholecystitis without obstruction: Secondary | ICD-10-CM | POA: Diagnosis present

## 2020-09-28 DIAGNOSIS — R079 Chest pain, unspecified: Secondary | ICD-10-CM | POA: Diagnosis not present

## 2020-09-28 DIAGNOSIS — Z96641 Presence of right artificial hip joint: Secondary | ICD-10-CM | POA: Diagnosis not present

## 2020-09-28 DIAGNOSIS — E860 Dehydration: Secondary | ICD-10-CM | POA: Diagnosis present

## 2020-09-28 DIAGNOSIS — N179 Acute kidney failure, unspecified: Secondary | ICD-10-CM | POA: Diagnosis present

## 2020-09-28 DIAGNOSIS — I493 Ventricular premature depolarization: Secondary | ICD-10-CM | POA: Diagnosis present

## 2020-09-28 DIAGNOSIS — I248 Other forms of acute ischemic heart disease: Secondary | ICD-10-CM | POA: Diagnosis not present

## 2020-09-28 DIAGNOSIS — R1010 Upper abdominal pain, unspecified: Secondary | ICD-10-CM | POA: Diagnosis not present

## 2020-09-28 DIAGNOSIS — I213 ST elevation (STEMI) myocardial infarction of unspecified site: Secondary | ICD-10-CM | POA: Diagnosis not present

## 2020-09-28 DIAGNOSIS — Z8249 Family history of ischemic heart disease and other diseases of the circulatory system: Secondary | ICD-10-CM | POA: Diagnosis not present

## 2020-09-28 DIAGNOSIS — I340 Nonrheumatic mitral (valve) insufficiency: Secondary | ICD-10-CM | POA: Diagnosis not present

## 2020-09-28 DIAGNOSIS — I10 Essential (primary) hypertension: Secondary | ICD-10-CM | POA: Diagnosis present

## 2020-09-28 DIAGNOSIS — I2109 ST elevation (STEMI) myocardial infarction involving other coronary artery of anterior wall: Secondary | ICD-10-CM | POA: Diagnosis present

## 2020-09-28 DIAGNOSIS — I351 Nonrheumatic aortic (valve) insufficiency: Secondary | ICD-10-CM | POA: Diagnosis not present

## 2020-09-28 DIAGNOSIS — I214 Non-ST elevation (NSTEMI) myocardial infarction: Secondary | ICD-10-CM

## 2020-09-28 HISTORY — PX: LEFT HEART CATH AND CORONARY ANGIOGRAPHY: CATH118249

## 2020-09-28 LAB — COMPREHENSIVE METABOLIC PANEL
ALT: 16 U/L (ref 0–44)
AST: 55 U/L — ABNORMAL HIGH (ref 15–41)
Albumin: 3.3 g/dL — ABNORMAL LOW (ref 3.5–5.0)
Alkaline Phosphatase: 78 U/L (ref 38–126)
Anion gap: 16 — ABNORMAL HIGH (ref 5–15)
BUN: 19 mg/dL (ref 8–23)
CO2: 23 mmol/L (ref 22–32)
Calcium: 9 mg/dL (ref 8.9–10.3)
Chloride: 100 mmol/L (ref 98–111)
Creatinine, Ser: 0.9 mg/dL (ref 0.44–1.00)
GFR, Estimated: >60 mL/min (ref >60)
Glucose, Bld: 126 mg/dL — ABNORMAL HIGH (ref 70–99)
Potassium: 4.5 mmol/L (ref 3.5–5.1)
Sodium: 139 mmol/L (ref 135–145)
Total Bilirubin: 1.1 mg/dL (ref 0.3–1.2)
Total Protein: 6.3 g/dL — ABNORMAL LOW (ref 6.5–8.1)

## 2020-09-28 LAB — POCT I-STAT, CHEM 8
BUN: 17 mg/dL (ref 8–23)
Calcium, Ion: 1.14 mmol/L — ABNORMAL LOW (ref 1.15–1.40)
Chloride: 101 mmol/L (ref 98–111)
Creatinine, Ser: 0.7 mg/dL (ref 0.44–1.00)
Glucose, Bld: 124 mg/dL — ABNORMAL HIGH (ref 70–99)
HCT: 46 % (ref 36.0–46.0)
Hemoglobin: 15.6 g/dL — ABNORMAL HIGH (ref 12.0–15.0)
Potassium: 3.8 mmol/L (ref 3.5–5.1)
Sodium: 140 mmol/L (ref 135–145)
TCO2: 25 mmol/L (ref 22–32)

## 2020-09-28 LAB — CBC
HCT: 47.9 % — ABNORMAL HIGH (ref 36.0–46.0)
Hemoglobin: 16 g/dL — ABNORMAL HIGH (ref 12.0–15.0)
MCH: 31.1 pg (ref 26.0–34.0)
MCHC: 33.4 g/dL (ref 30.0–36.0)
MCV: 93 fL (ref 80.0–100.0)
Platelets: 243 10*3/uL (ref 150–400)
RBC: 5.15 MIL/uL — ABNORMAL HIGH (ref 3.87–5.11)
RDW: 13.8 % (ref 11.5–15.5)
WBC: 7.2 10*3/uL (ref 4.0–10.5)
nRBC: 0 % (ref 0.0–0.2)

## 2020-09-28 LAB — CBC WITH DIFFERENTIAL/PLATELET
Abs Immature Granulocytes: 0.03 10*3/uL (ref 0.00–0.07)
Basophils Absolute: 0 10*3/uL (ref 0.0–0.1)
Basophils Relative: 0 %
Eosinophils Absolute: 0 10*3/uL (ref 0.0–0.5)
Eosinophils Relative: 0 %
HCT: 49.1 % — ABNORMAL HIGH (ref 36.0–46.0)
Hemoglobin: 15.8 g/dL — ABNORMAL HIGH (ref 12.0–15.0)
Immature Granulocytes: 0 %
Lymphocytes Relative: 23 %
Lymphs Abs: 1.5 10*3/uL (ref 0.7–4.0)
MCH: 30.3 pg (ref 26.0–34.0)
MCHC: 32.2 g/dL (ref 30.0–36.0)
MCV: 94.2 fL (ref 80.0–100.0)
Monocytes Absolute: 0.6 10*3/uL (ref 0.1–1.0)
Monocytes Relative: 9 %
Neutro Abs: 4.6 10*3/uL (ref 1.7–7.7)
Neutrophils Relative %: 68 %
Platelets: 257 10*3/uL (ref 150–400)
RBC: 5.21 MIL/uL — ABNORMAL HIGH (ref 3.87–5.11)
RDW: 13.6 % (ref 11.5–15.5)
WBC: 6.8 10*3/uL (ref 4.0–10.5)
nRBC: 0 % (ref 0.0–0.2)

## 2020-09-28 LAB — LIPID PANEL
Cholesterol: 188 mg/dL (ref 0–200)
HDL: 44 mg/dL (ref >40)
LDL Cholesterol: 118 mg/dL — ABNORMAL HIGH (ref 0–99)
Total CHOL/HDL Ratio: 4.3 RATIO
Triglycerides: 128 mg/dL (ref <150)
VLDL: 26 mg/dL (ref 0–40)

## 2020-09-28 LAB — HEMOGLOBIN A1C
Hgb A1c MFr Bld: 5.6 % (ref 4.8–5.6)
Mean Plasma Glucose: 114.02 mg/dL

## 2020-09-28 LAB — PROTIME-INR
INR: 1 (ref 0.8–1.2)
Prothrombin Time: 12.3 seconds (ref 11.4–15.2)

## 2020-09-28 LAB — APTT: aPTT: 22 seconds — ABNORMAL LOW (ref 24–36)

## 2020-09-28 LAB — TROPONIN I (HIGH SENSITIVITY)
Troponin I (High Sensitivity): 4566 ng/L (ref <18)
Troponin I (High Sensitivity): 4586 ng/L (ref <18)

## 2020-09-28 LAB — CREATININE, SERUM
Creatinine, Ser: 0.89 mg/dL (ref 0.44–1.00)
GFR, Estimated: >60 mL/min (ref >60)

## 2020-09-28 SURGERY — LEFT HEART CATH AND CORONARY ANGIOGRAPHY
Anesthesia: LOCAL

## 2020-09-28 MED ORDER — DIAZEPAM 2 MG PO TABS: 5.0000 mg | ORAL_TABLET | ORAL | Status: DC | PRN

## 2020-09-28 MED ORDER — LIDOCAINE HCL (PF) 1 % IJ SOLN
INTRAMUSCULAR | Status: DC | PRN
  Administered 2020-09-28: 2 mL

## 2020-09-28 MED ORDER — TRIAMTERENE-HCTZ 37.5-25 MG PO TABS
1.0000 | ORAL_TABLET | Freq: Every day | ORAL | Status: DC
  Administered 2020-09-28: 1 via ORAL
  Filled 2020-09-28 (×2): qty 1

## 2020-09-28 MED ORDER — VERAPAMIL HCL 2.5 MG/ML IV SOLN
INTRAVENOUS | Status: DC | PRN
  Administered 2020-09-28: 10 mL via INTRA_ARTERIAL

## 2020-09-28 MED ORDER — EZETIMIBE 10 MG PO TABS
10.0000 mg | ORAL_TABLET | Freq: Every day | ORAL | Status: DC
  Administered 2020-09-28 – 2020-10-03 (×6): 10 mg via ORAL
  Filled 2020-09-28 (×6): qty 1

## 2020-09-28 MED ORDER — HEPARIN SODIUM (PORCINE) 5000 UNIT/ML IJ SOLN
5000.0000 [IU] | Freq: Three times a day (TID) | INTRAMUSCULAR | Status: DC
  Administered 2020-09-29 – 2020-10-03 (×11): 5000 [IU] via SUBCUTANEOUS
  Filled 2020-09-28 (×11): qty 1

## 2020-09-28 MED ORDER — OMEPRAZOLE MAGNESIUM 20 MG PO TBEC: 20.0000 mg | DELAYED_RELEASE_TABLET | Freq: Every day | ORAL | Status: DC

## 2020-09-28 MED ORDER — IOHEXOL 350 MG/ML SOLN
INTRAVENOUS | Status: AC
  Filled 2020-09-28: qty 1

## 2020-09-28 MED ORDER — SODIUM CHLORIDE 0.9 % WEIGHT BASED INFUSION
1.0000 mL/kg/h | INTRAVENOUS | Status: AC
  Administered 2020-09-28: 1 mL/kg/h via INTRAVENOUS

## 2020-09-28 MED ORDER — HEPARIN (PORCINE) IN NACL 1000-0.9 UT/500ML-% IV SOLN
INTRAVENOUS | Status: DC | PRN
  Administered 2020-09-28 (×2): 500 mL

## 2020-09-28 MED ORDER — LABETALOL HCL 5 MG/ML IV SOLN: 10.0000 mg | INTRAVENOUS | Status: AC | PRN

## 2020-09-28 MED ORDER — SODIUM CHLORIDE 0.9 % IV SOLN: INTRAVENOUS | Status: DC

## 2020-09-28 MED ORDER — HYDRALAZINE HCL 20 MG/ML IJ SOLN: 10.0000 mg | INTRAMUSCULAR | Status: AC | PRN

## 2020-09-28 MED ORDER — IOHEXOL 350 MG/ML SOLN
INTRAVENOUS | Status: DC | PRN
  Administered 2020-09-28: 75 mL via INTRA_ARTERIAL

## 2020-09-28 MED ORDER — CARVEDILOL 3.125 MG PO TABS
3.1250 mg | ORAL_TABLET | Freq: Two times a day (BID) | ORAL | Status: DC
  Administered 2020-09-29 (×2): 3.125 mg via ORAL
  Filled 2020-09-28 (×3): qty 1

## 2020-09-28 MED ORDER — ONDANSETRON 4 MG PO TBDP
8.0000 mg | ORAL_TABLET | Freq: Three times a day (TID) | ORAL | Status: DC | PRN
  Filled 2020-09-28: qty 2

## 2020-09-28 MED ORDER — CYCLOBENZAPRINE HCL 10 MG PO TABS: 5.0000 mg | ORAL_TABLET | Freq: Three times a day (TID) | ORAL | Status: DC | PRN

## 2020-09-28 MED ORDER — SIMVASTATIN 20 MG PO TABS: 20.0000 mg | ORAL_TABLET | ORAL | Status: DC

## 2020-09-28 MED ORDER — SODIUM CHLORIDE 0.9% FLUSH
3.0000 mL | INTRAVENOUS | Status: DC | PRN
  Administered 2020-10-01: 22:00:00 3 mL via INTRAVENOUS

## 2020-09-28 MED ORDER — ACETAMINOPHEN 325 MG PO TABS
650.0000 mg | ORAL_TABLET | ORAL | Status: DC | PRN
  Administered 2020-10-03: 650 mg via ORAL
  Filled 2020-09-28: qty 2

## 2020-09-28 MED ORDER — MIRABEGRON ER 50 MG PO TB24
50.0000 mg | ORAL_TABLET | Freq: Every day | ORAL | Status: DC
  Administered 2020-09-28 – 2020-10-03 (×5): 50 mg via ORAL
  Filled 2020-09-28 (×7): qty 1

## 2020-09-28 MED ORDER — HEPARIN SODIUM (PORCINE) 5000 UNIT/ML IJ SOLN: 4000.0000 [IU] | Freq: Once | INTRAMUSCULAR | Status: DC

## 2020-09-28 MED ORDER — HEPARIN (PORCINE) IN NACL 1000-0.9 UT/500ML-% IV SOLN
INTRAVENOUS | Status: AC
  Filled 2020-09-28: qty 1000

## 2020-09-28 MED ORDER — SODIUM CHLORIDE 0.9 % IV BOLUS: 500.0000 mL | Freq: Once | INTRAVENOUS | Status: DC

## 2020-09-28 MED ORDER — MIDAZOLAM HCL 2 MG/2ML IJ SOLN
INTRAMUSCULAR | Status: AC
  Filled 2020-09-28: qty 2

## 2020-09-28 MED ORDER — BUPROPION HCL ER (XL) 150 MG PO TB24
300.0000 mg | ORAL_TABLET | Freq: Every day | ORAL | Status: DC
  Administered 2020-09-29 – 2020-10-03 (×5): 300 mg via ORAL
  Filled 2020-09-28 (×5): qty 2

## 2020-09-28 MED ORDER — CANGRELOR TETRASODIUM 50 MG IV SOLR
INTRAVENOUS | Status: AC
  Filled 2020-09-28: qty 50

## 2020-09-28 MED ORDER — DONEPEZIL HCL 10 MG PO TABS
10.0000 mg | ORAL_TABLET | Freq: Every day | ORAL | Status: DC
  Administered 2020-09-28 – 2020-10-02 (×5): 10 mg via ORAL
  Filled 2020-09-28 (×5): qty 1

## 2020-09-28 MED ORDER — GABAPENTIN 300 MG PO CAPS
300.0000 mg | ORAL_CAPSULE | Freq: Every day | ORAL | Status: DC
  Administered 2020-09-28 – 2020-10-02 (×5): 300 mg via ORAL
  Filled 2020-09-28 (×5): qty 1

## 2020-09-28 MED ORDER — VITAMIN D 25 MCG (1000 UNIT) PO TABS
5000.0000 [IU] | ORAL_TABLET | Freq: Every day | ORAL | Status: DC
  Administered 2020-09-28 – 2020-10-02 (×5): 5000 [IU] via ORAL
  Filled 2020-09-28 (×9): qty 5

## 2020-09-28 MED ORDER — RALOXIFENE HCL 60 MG PO TABS
60.0000 mg | ORAL_TABLET | Freq: Every day | ORAL | Status: DC
  Administered 2020-09-28 – 2020-10-03 (×5): 60 mg via ORAL
  Filled 2020-09-28 (×6): qty 1

## 2020-09-28 MED ORDER — SODIUM CHLORIDE 0.9 % IV SOLN: 250.0000 mL | INTRAVENOUS | Status: DC | PRN

## 2020-09-28 MED ORDER — DULOXETINE HCL 60 MG PO CPEP
60.0000 mg | ORAL_CAPSULE | Freq: Every day | ORAL | Status: DC
  Administered 2020-09-28 – 2020-10-03 (×6): 60 mg via ORAL
  Filled 2020-09-28 (×6): qty 1

## 2020-09-28 MED ORDER — FENTANYL CITRATE (PF) 100 MCG/2ML IJ SOLN
INTRAMUSCULAR | Status: AC
  Filled 2020-09-28: qty 2

## 2020-09-28 MED ORDER — ASPIRIN 300 MG RE SUPP: 300.0000 mg | Freq: Once | RECTAL | Status: DC

## 2020-09-28 MED ORDER — HEPARIN SODIUM (PORCINE) 1000 UNIT/ML IJ SOLN
INTRAMUSCULAR | Status: AC
  Filled 2020-09-28: qty 1

## 2020-09-28 MED ORDER — FENTANYL CITRATE (PF) 100 MCG/2ML IJ SOLN
INTRAMUSCULAR | Status: DC | PRN
  Administered 2020-09-28: 25 ug via INTRAVENOUS

## 2020-09-28 MED ORDER — MIDAZOLAM HCL 2 MG/2ML IJ SOLN
INTRAMUSCULAR | Status: DC | PRN
  Administered 2020-09-28: 1 mg via INTRAVENOUS

## 2020-09-28 MED ORDER — VERAPAMIL HCL 2.5 MG/ML IV SOLN
INTRAVENOUS | Status: AC
  Filled 2020-09-28: qty 4

## 2020-09-28 MED ORDER — ONDANSETRON HCL 4 MG/2ML IJ SOLN
4.0000 mg | Freq: Four times a day (QID) | INTRAMUSCULAR | Status: DC | PRN
  Administered 2020-09-28 – 2020-10-01 (×6): 4 mg via INTRAVENOUS
  Filled 2020-09-28 (×7): qty 2

## 2020-09-28 MED ORDER — LIDOCAINE HCL (PF) 1 % IJ SOLN
INTRAMUSCULAR | Status: AC
  Filled 2020-09-28: qty 30

## 2020-09-28 MED ORDER — SODIUM CHLORIDE 0.9% FLUSH: 3.0000 mL | Freq: Two times a day (BID) | INTRAVENOUS | Status: DC

## 2020-09-28 MED ORDER — SODIUM CHLORIDE 0.9 % IV SOLN
INTRAVENOUS | Status: AC | PRN
  Administered 2020-09-28: 10 mL/h via INTRAVENOUS

## 2020-09-28 MED ORDER — NITROGLYCERIN 1 MG/10 ML FOR IR/CATH LAB
INTRA_ARTERIAL | Status: AC
  Filled 2020-09-28: qty 10

## 2020-09-28 MED ORDER — PANTOPRAZOLE SODIUM 40 MG PO TBEC
40.0000 mg | DELAYED_RELEASE_TABLET | Freq: Every day | ORAL | Status: DC
  Administered 2020-09-28 – 2020-10-01 (×4): 40 mg via ORAL
  Filled 2020-09-28 (×4): qty 1

## 2020-09-28 MED ORDER — HEPARIN SODIUM (PORCINE) 1000 UNIT/ML IJ SOLN
INTRAMUSCULAR | Status: DC | PRN
  Administered 2020-09-28: 8000 [IU] via INTRAVENOUS

## 2020-09-28 SURGICAL SUPPLY — 14 items
CATH 5FR JL3.5 JR4 ANG PIG MP (CATHETERS) ×1 IMPLANT
DEVICE RAD COMP TR BAND LRG (VASCULAR PRODUCTS) ×1 IMPLANT
GLIDESHEATH SLEND SS 6F .021 (SHEATH) ×1 IMPLANT
GUIDEWIRE ANGLED .035X150CM (WIRE) ×1 IMPLANT
GUIDEWIRE INQWIRE 1.5J.035X260 (WIRE) IMPLANT
INQWIRE 1.5J .035X260CM (WIRE) ×2
KIT ENCORE 26 ADVANTAGE (KITS) ×1 IMPLANT
KIT HEART LEFT (KITS) ×2 IMPLANT
KIT HEMO VALVE WATCHDOG (MISCELLANEOUS) ×1 IMPLANT
PACK CARDIAC CATHETERIZATION (CUSTOM PROCEDURE TRAY) ×2 IMPLANT
SYR MEDRAD MARK 7 150ML (SYRINGE) ×1 IMPLANT
TRANSDUCER W/STOPCOCK (MISCELLANEOUS) ×2 IMPLANT
TUBING CIL FLEX 10 FLL-RA (TUBING) ×2 IMPLANT
WIRE COUGAR XT STRL 190CM (WIRE) ×1 IMPLANT

## 2020-09-28 NOTE — ED Provider Notes (Signed)
[place] EMERGENCY DEPARTMENT Provider Note   CSN: [id number] Arrival date & time: 09/28/20  1812  LEVEL 5 CAVEAT - ACUITY OF CONDITION History No chief complaint on file.   Colleen Glenn is a 79 y.o. female.  HPI 79 year old female brought in as abdominal/chest pain and vomiting.  ECG from EMS revealed anterior STEMI and code STEMI was called.  The patient tells me that this pain and vomiting feels like her gallbladder as she has had issues with it and a stone was found on a recent ultrasound.  Numerous episodes of vomiting and abdominal pain but it has been consistent all day today.  Some diaphoresis.  No shortness of breath.  Given Zofran and nitroglycerin by EMS.  No aspirin given because patient was stating she would vomit it up.   Past Medical History:  Diagnosis Date  . Anxiety    on meds  . Arthritis    OA bil shoulders, hands, back  . Back pain   . BPPV (benign paroxysmal positional vertigo) 10/2019  . Bursitis   . Colon polyps    adenomatous  . Colon polyps   . Depression    on meds  . Diverticulosis   . GERD (gastroesophageal reflux disease)    on meds  . Hemorrhoids   . Hyperlipidemia    on meds  . Mild hypertension    white coat syndrome- on meds  . OAB (overactive bladder)   . Osteopenia   . Panic disorder 11/2012  . Plantar fasciitis    hx of  . PUD (peptic ulcer disease)   . Shingles   . Tremor    mild action tremor suspected 2020  . Vitamin D deficiency    on meds    Patient Active Problem List   Diagnosis Date Noted  . Anterolateral myocardial infarction ([place]) 09/28/2020  . Takotsubo syndrome 09/28/2020  . AMS (altered mental status) 09/19/2020  . Primary osteoarthritis of right knee 03/22/2020  . GERD (gastroesophageal reflux disease)   . GAD (generalized anxiety disorder) 10/31/2019  . Memory loss 10/31/2019  . Postop Hyponatremia 11/18/2012  . Postop Acute blood loss anemia 11/18/2012  . OA  (osteoarthritis) of hip 11/16/2012  . NAUSEA 08/01/2009  . HEMORRHOIDS-INTERNAL 07/31/2009  . HEMORRHOIDS-EXTERNAL 07/31/2009  . DIVERTICULOSIS-COLON 07/31/2009  . RECTAL BLEEDING 07/31/2009  . NAUSEA WITH VOMITING 07/31/2009  . Abdominal pain, epigastric 07/31/2009  . PERSONAL HX COLONIC POLYPS 07/31/2009    Past Surgical History:  Procedure Laterality Date  . ABDOMINAL HYSTERECTOMY    . APPENDECTOMY    . BACK SURGERY    . COLONOSCOPY  2016   JP-MAC-TA  . FOOT SURGERY     bilateral   . HEMORRHOID SURGERY    . JOINT REPLACEMENT Right 2014  . laproscopic knee surgery Right    2018  . LUMBAR LAMINECTOMY/DECOMPRESSION MICRODISCECTOMY  05/07/2012   Procedure: LUMBAR LAMINECTOMY/DECOMPRESSION MICRODISCECTOMY 1 LEVEL;  Surgeon: Eustace Moore, MD;  Location: [facility];  Service: Neurosurgery;  Laterality: Right;  Lumbar Laminectomy Decompression Microdiscectomy Lumbar Three-Four  . multiple foot surgeries    . POLYPECTOMY  2016   TA  . REVERSE SHOULDER ARTHROPLASTY Left 07/12/2020   Procedure: REVERSE SHOULDER ARTHROPLASTY;  Surgeon: Hiram Gash, MD;  Location: [facility];  Service: Orthopedics;  Laterality: Left;  . ROTATOR CUFF REPAIR     right side  . TONSILLECTOMY    . TOTAL HIP ARTHROPLASTY  11/16/2012   Procedure: TOTAL HIP ARTHROPLASTY;  Surgeon: Gearlean Alf, MD;  Location: [facility];  Service: Orthopedics;  Laterality: Right;  Right Total Hip Arthroplasty  . TUBAL LIGATION    . VAGOTOMY     approx. 35 years ago, states has a clamp mid epigastric area     OB History   No obstetric history on file.     Family History  Problem Relation Age of Onset  . Colon polyps Mother   . Stroke Mother   . Hypertension Mother   . Congestive Heart Failure Mother   . Stroke Other        several family members on mother's side  . Lung cancer Father 14  . Heart disease Father   . Colon cancer Neg Hx   . Rectal cancer Neg Hx   . Stomach cancer Neg Hx   .  Esophageal cancer Neg Hx     Social History   Tobacco Use  . Smoking status: Former Smoker    Packs/day: 1.00    Years: 20.00    Pack years: 20.00    Types: Cigarettes    Quit date: 1983    Years since quitting: 38.9  . Smokeless tobacco: Never Used  . Tobacco comment: 45 years ago  Vaping Use  . Vaping Use: Never used  Substance Use Topics  . Alcohol use: Yes    Alcohol/week: 2.0 - 4.0 standard drinks    Types: 2 - 4 Glasses of wine per week    Comment: every 3-4 days  . Drug use: No    Home Medications Prior to Admission medications   Medication Sig Start Date End Date Taking? Authorizing Provider  buPROPion (WELLBUTRIN XL) 300 MG 24 hr tablet Take 300 mg by mouth daily before breakfast.     [provider]  Cholecalciferol (VITAMIN D) 125 MCG (5000 UT) CAPS Take 5,000 Units by mouth daily.    [provider]  cyclobenzaprine (FLEXERIL) 5 MG tablet Take 5 mg by mouth 3 (three) times daily as needed for muscle spasms.    [provider]  donepezil (ARICEPT) 10 MG tablet Take 10 mg by mouth at bedtime.    [provider]  DULoxetine (CYMBALTA) 60 MG capsule Take 60 mg by mouth daily. 10/25/19   [provider]  ezetimibe (ZETIA) 10 MG tablet Take 10 mg by mouth daily. 11/18/19   [provider]  gabapentin (NEURONTIN) 300 MG capsule TAKE 1 CAPSULE(300 MG) BY MOUTH AT BEDTIME Patient taking differently: Take 300 mg by mouth at bedtime.  07/11/20   Melvenia Beam, MD  MYRBETRIQ 50 MG TB24 tablet Take 50 mg by mouth daily. 03/06/20   [provider]  omeprazole (PRILOSEC OTC) 20 MG tablet Take 20 mg by mouth daily.    [provider]  ondansetron (ZOFRAN-ODT) 8 MG disintegrating tablet Take 8 mg by mouth every 8 (eight) hours as needed for nausea/vomiting. 09/13/20   [provider]  raloxifene (EVISTA) 60 MG tablet Take 60 mg by mouth daily.    [provider]  simvastatin (ZOCOR) 20 MG tablet  Take 20 mg by mouth every other day. 07/30/20   [provider]  triamterene-hydrochlorothiazide (MAXZIDE-25) 37.5-25 MG per tablet Take 1 tablet by mouth daily in the afternoon.     [provider]  vitamin B-12 (CYANOCOBALAMIN) 1000 MCG tablet Take 1,000 mcg by mouth daily.    [provider]    Allergies    Patient has no known allergies.  Review of  Systems   Review of Systems  Unable to perform ROS: Acuity of condition    Physical Exam Updated Vital Signs BP 123/77 (BP Location: Left Arm)   Pulse 86   Temp 97.9 F (36.6 C) (Oral)   Resp 18   Ht _0  (1.676 m)   Wt 67.1 kg   SpO2 95%   BMI 23.88 kg/m   Physical Exam Vitals and nursing note reviewed.  Constitutional:      General: She is in acute distress (in pain).     Appearance: She is well-developed and well-nourished.  HENT:     Head: Normocephalic and atraumatic.     Right Ear: External ear normal.     Left Ear: External ear normal.     Nose: Nose normal.  Eyes:     General:        Right eye: No discharge.        Left eye: No discharge.  Cardiovascular:     Rate and Rhythm: Normal rate and regular rhythm.     Heart sounds: Normal heart sounds.  Pulmonary:     Effort: Pulmonary effort is normal.     Breath sounds: Normal breath sounds.  Abdominal:     General: There is no distension.  Skin:    General: Skin is warm and dry.  Neurological:     Mental Status: She is alert.  Psychiatric:        Mood and Affect: Mood is not anxious.     ED Results / Procedures / Treatments   Labs (all labs ordered are listed, but only abnormal results are displayed) Labs Reviewed  CBC WITH DIFFERENTIAL/PLATELET - Abnormal; Notable for the following components:      Result Value   RBC 5.21 (*)    Hemoglobin 15.8 (*)    HCT 49.1 (*)    All other components within normal limits  APTT - Abnormal; Notable for the following components:   aPTT 22 (*)    All other components within normal  limits  COMPREHENSIVE METABOLIC PANEL - Abnormal; Notable for the following components:   Glucose, Bld 126 (*)    Total Protein 6.3 (*)    Albumin 3.3 (*)    AST 55 (*)    Anion gap 16 (*)    All other components within normal limits  LIPID PANEL - Abnormal; Notable for the following components:   LDL Cholesterol 118 (*)    All other components within normal limits  POCT I-STAT, CHEM 8 - Abnormal; Notable for the following components:   Glucose, Bld 124 (*)    Calcium, Ion 1.14 (*)    Hemoglobin 15.6 (*)    All other components within normal limits  TROPONIN I (HIGH SENSITIVITY) - Abnormal; Notable for the following components:   Troponin I (High Sensitivity) 4,566 (*)    All other components within normal limits  HEMOGLOBIN A1C  PROTIME-INR  CBC  CREATININE, SERUM  TROPONIN I (HIGH SENSITIVITY)    EKG EKG Interpretation  Date/Time:  Thursday September 28 2020 18:17:01 EST Ventricular Rate:  113 PR Interval:    QRS Duration: 83 QT Interval:  305 QTC Calculation: 452 R Axis:   6 Text Interpretation: Sinus tachycardia LAE, consider biatrial enlargement Inferior infarct, acute (LCx) Anterolateral infarct, acute (LAD) >>> Acute MI <<<  STEMI new since Sep 19 2020 Confirmed by Sherwood Gambler [phone]) on 09/28/2020 6:22:20 PM   Radiology CARDIAC CATHETERIZATION  Result Date: 09/28/2020 1.  Severe segmental LV dysfunction,  with classic pattern of acute Takotsubo syndrome (LV apical ballooning) 2.  Nonobstructive coronary artery disease with moderate mid LAD stenosis, mild proximal RCA stenosis, and no evidence of high-grade obstruction 3.  Mildly elevated LVEDP Recommendations: Medical therapy, supportive care.  Anticipate 48-hour hospitalization as long as no early complications arise.  Repeat echocardiogram in about 3 months to assess for LV recovery.  Treat with a beta-blocker as tolerated.  DG Chest Port 1 View  Result Date: 09/28/2020 CLINICAL DATA:  STEMI EXAM: PORTABLE  CHEST 1 VIEW COMPARISON:  09/19/2020 and prior FINDINGS: No focal consolidation. No pneumothorax or pleural effusion. Stable cardiomediastinal silhouette. Sequela of left shoulder arthroplasty. Epigastric surgical clips. IMPRESSION: No focal airspace disease. Electronically Signed   By: Primitivo Gauze M.D.   On: 09/28/2020 20:18    Procedures .Critical Care Performed by: Sherwood Gambler, MD Authorized by: Sherwood Gambler, MD   Critical care provider statement:    Critical care time (minutes):  30   Critical care time was exclusive of:  Separately billable procedures and treating other patients   Critical care was necessary to treat or prevent imminent or life-threatening deterioration of the following conditions:  Cardiac failure   Critical care was time spent personally by me on the following activities:  Discussions with consultants, evaluation of patient's response to treatment, examination of patient, ordering and performing treatments and interventions, ordering and review of laboratory studies, ordering and review of radiographic studies, pulse oximetry, re-evaluation of patient's condition, obtaining history from patient or surrogate and review of old charts   (including critical care time)  Medications Ordered in ED Medications  sodium chloride 0.9 % bolus 500 mL ( Intravenous MAR Unhold 09/28/20 1952)  buPROPion (WELLBUTRIN XL) 24 hr tablet 300 mg (has no administration in time range)  Vitamin D CAPS 5,000 Units (has no administration in time range)  cyclobenzaprine (FLEXERIL) tablet 5 mg (has no administration in time range)  donepezil (ARICEPT) tablet 10 mg (has no administration in time range)  DULoxetine (CYMBALTA) DR capsule 60 mg (has no administration in time range)  ezetimibe (ZETIA) tablet 10 mg (has no administration in time range)  gabapentin (NEURONTIN) capsule 300 mg (has no administration in time range)  mirabegron ER (MYRBETRIQ) tablet 50 mg (has no  administration in time range)  omeprazole (PRILOSEC OTC) EC tablet 20 mg (has no administration in time range)  ondansetron (ZOFRAN-ODT) disintegrating tablet 8 mg (has no administration in time range)  raloxifene (EVISTA) tablet 60 mg (has no administration in time range)  simvastatin (ZOCOR) tablet 20 mg (has no administration in time range)  triamterene-hydrochlorothiazide (MAXZIDE-25) 37.5-25 MG per tablet 1 tablet (has no administration in time range)  labetalol (NORMODYNE) injection 10 mg (has no administration in time range)  hydrALAZINE (APRESOLINE) injection 10 mg (has no administration in time range)  acetaminophen (TYLENOL) tablet 650 mg (has no administration in time range)  ondansetron (ZOFRAN) injection 4 mg (has no administration in time range)  heparin injection 5,000 Units (has no administration in time range)  0.9% sodium chloride infusion (has no administration in time range)  sodium chloride flush (NS) 0.9 % injection 3 mL (has no administration in time range)  sodium chloride flush (NS) 0.9 % injection 3 mL (has no administration in time range)  0.9 %  sodium chloride infusion (has no administration in time range)  diazepam (VALIUM) tablet 5 mg (has no administration in time range)  carvedilol (COREG) tablet 3.125 mg (has no administration in time range)  0.9 %  sodium chloride infusion (10 mL/hr Intravenous New Bag/Given 09/28/20 1837)    ED Course  I have reviewed the triage vital signs and the nursing notes.  Pertinent labs & imaging results that were available during my care of the patient were reviewed by me and considered in my medical decision making (see chart for details).    MDM Rules/Calculators/A&P                          Patient presents with abdominal/chest pain and vomiting.  She feels like it is from her gallbladder which has been previously diagnosed but her ECG is convincing for anterior STEMI.  She will be taken emergently to the Cath Lab.  Perhaps  the abdominal pain is from her gallbladder but it also could be that she was misdiagnosed and has really been having angina this whole time.  Will order aspirin suppository given her vomiting and give IV heparin bolus. Final Clinical Impression(s) / ED Diagnoses Final diagnoses:  ST elevation myocardial infarction (STEMI), unspecified artery [facility])    Rx / DC Orders ED Discharge Orders    None       Sherwood Gambler, MD 09/28/20 2029

## 2020-09-28 NOTE — H&P (Signed)
Cardiology Admission History and Physical:   Patient ID: Colleen Glenn MRN: [id number]; DOB: June 27, 1941   Admission date: 09/28/2020  Primary Care Provider: Crist Infante, MD [place] Cardiologist: No primary care provider on file.  [facility] Electrophysiologist:  None   Chief Complaint:  Chest pain  Patient Profile:   Colleen Glenn is a 79 y.o. female with no past cardiac history who presents with chest pain and EKG in the field demonstrating anterolateral STEMI.  History of Present Illness:   Colleen Glenn has been intermittently nauseated over the past few weeks with episodes of vomiting as well.  Today she developed substernal chest pain.  Her daughter called EMS and they activated a code STEMI from the field after her EKG suggested an anterolateral STEMI.  The patient is brought directly to the cardiac catheterization lab.  She is interviewed in the Cath Lab on arrival.  She complains of 8-9/10 chest pain located in the center of her chest.  She continues to have nausea and has vomited this evening.  No diaphoresis or shortness of breath.  The patient was recently evaluated in the emergency department September 19, 2020 for concerns about a TIA.  She had transient confusion and some difficulty with word finding.  Her work-up was essentially negative.  She also tested positive for COVID-19 but was felt to be asymptomatic.  She has been diagnosed with an obstructive common bile duct stone and is in the process of being evaluated for a cholecystectomy.  She had total shoulder replacement in September of this year after a fall causing severe rotator cuff tear.  Otherwise no pertinent medical history.   Past Medical History:  Diagnosis Date  . Anxiety    on meds  . Arthritis    OA bil shoulders, hands, back  . Back pain   . BPPV (benign paroxysmal positional vertigo) 10/2019  . Bursitis   . Colon polyps    adenomatous  . Colon polyps   . Depression    on meds   . Diverticulosis   . GERD (gastroesophageal reflux disease)    on meds  . Hemorrhoids   . Hyperlipidemia    on meds  . Mild hypertension    white coat syndrome- on meds  . OAB (overactive bladder)   . Osteopenia   . Panic disorder 11/2012  . Plantar fasciitis    hx of  . PUD (peptic ulcer disease)   . Shingles   . Tremor    mild action tremor suspected 2020  . Vitamin D deficiency    on meds    Past Surgical History:  Procedure Laterality Date  . ABDOMINAL HYSTERECTOMY    . APPENDECTOMY    . BACK SURGERY    . COLONOSCOPY  2016   [facility]-MAC-TA  . FOOT SURGERY     bilateral   . HEMORRHOID SURGERY    . JOINT REPLACEMENT Right 2014  . laproscopic knee surgery Right    2018  . LUMBAR LAMINECTOMY/DECOMPRESSION MICRODISCECTOMY  05/07/2012   Procedure: LUMBAR LAMINECTOMY/DECOMPRESSION MICRODISCECTOMY 1 LEVEL;  Surgeon: Eustace Moore, MD;  Location: [facility];  Service: Neurosurgery;  Laterality: Right;  Lumbar Laminectomy Decompression Microdiscectomy Lumbar Three-Four  . multiple foot surgeries    . POLYPECTOMY  2016   TA  . REVERSE SHOULDER ARTHROPLASTY Left 07/12/2020   Procedure: REVERSE SHOULDER ARTHROPLASTY;  Surgeon: Hiram Gash, MD;  Location: [facility];  Service: Orthopedics;  Laterality: Left;  . ROTATOR CUFF REPAIR  right side  . TONSILLECTOMY    . TOTAL HIP ARTHROPLASTY  11/16/2012   Procedure: TOTAL HIP ARTHROPLASTY;  Surgeon: Gearlean Alf, MD;  Location: [facility];  Service: Orthopedics;  Laterality: Right;  Right Total Hip Arthroplasty  . TUBAL LIGATION    . VAGOTOMY     approx. 35 years ago, states has a clamp mid epigastric area     Medications Prior to Admission: Prior to Admission medications   Medication Sig Start Date End Date Taking? Authorizing Provider  buPROPion (WELLBUTRIN XL) 300 MG 24 hr tablet Take 300 mg by mouth daily before breakfast.     [provider]  Cholecalciferol (VITAMIN D) 125 MCG (5000 UT) CAPS Take  5,000 Units by mouth daily.    [provider]  cyclobenzaprine (FLEXERIL) 5 MG tablet Take 5 mg by mouth 3 (three) times daily as needed for muscle spasms.    [provider]  donepezil (ARICEPT) 10 MG tablet Take 10 mg by mouth at bedtime.    [provider]  DULoxetine (CYMBALTA) 60 MG capsule Take 60 mg by mouth daily. 10/25/19   [provider]  ezetimibe (ZETIA) 10 MG tablet Take 10 mg by mouth daily. 11/18/19   [provider]  gabapentin (NEURONTIN) 300 MG capsule TAKE 1 CAPSULE(300 MG) BY MOUTH AT BEDTIME Patient taking differently: Take 300 mg by mouth at bedtime.  07/11/20   Melvenia Beam, MD  MYRBETRIQ 50 MG TB24 tablet Take 50 mg by mouth daily. 03/06/20   [provider]  omeprazole (PRILOSEC OTC) 20 MG tablet Take 20 mg by mouth daily.    [provider]  ondansetron (ZOFRAN-ODT) 8 MG disintegrating tablet Take 8 mg by mouth every 8 (eight) hours as needed for nausea/vomiting. 09/13/20   [provider]  raloxifene (EVISTA) 60 MG tablet Take 60 mg by mouth daily.    [provider]  simvastatin (ZOCOR) 20 MG tablet Take 20 mg by mouth every other day. 07/30/20   [provider]  triamterene-hydrochlorothiazide (MAXZIDE-25) 37.5-25 MG per tablet Take 1 tablet by mouth daily in the afternoon.     [provider]  vitamin B-12 (CYANOCOBALAMIN) 1000 MCG tablet Take 1,000 mcg by mouth daily.    [provider]     Allergies:   No Known Allergies  Social History:   Social History   Socioeconomic History  . Marital status: Widowed    Spouse name: Not on file  . Number of children: 3  . Years of education: cosmetology degree  . Highest education level: GED or equivalent  Occupational History  . Occupation: Retired  Tobacco Use  . Smoking status: Former Smoker    Packs/day: 1.00    Years: 20.00    Pack years: 20.00    Types: Cigarettes    Quit date: 1983    Years since  quitting: 38.9  . Smokeless tobacco: Never Used  . Tobacco comment: 45 years ago  Vaping Use  . Vaping Use: Never used  Substance and Sexual Activity  . Alcohol use: Yes    Alcohol/week: 2.0 - 4.0 standard drinks    Types: 2 - 4 Glasses of wine per week    Comment: every 3-4 days  . Drug use: No  . Sexual activity: Not Currently    Partners: Male  Other Topics Concern  . Not on file  Social History Narrative   Lives alone   Right handed   Social Determinants of Health  Financial Resource Strain: Not on file  Food Insecurity: Not on file  Transportation Needs: Not on file  Physical Activity: Not on file  Stress: Not on file  Social Connections: Not on file  Intimate Partner Violence: Not on file    Family History:   The patient's family history includes Colon polyps in her mother; Congestive Heart Failure in her mother; Heart disease in her father; Hypertension in her mother; Lung cancer (age of onset: 51) in her father; Stroke in her mother and another family member. There is no history of Colon cancer, Rectal cancer, Stomach cancer, or Esophageal cancer.    ROS:  Please see the history of present illness.  All other ROS reviewed and negative.     Physical Exam/Data:   Vitals:   09/28/20 1839 09/28/20 1844 09/28/20 1849 09/28/20 1854  BP: 133/84 109/64 95/64 112/71  Pulse: 85 92 87 87  Resp: _0 (!) 25  SpO2: 99% 99% 98% 97%   No intake or output data in the 24 hours ending 09/28/20 1925 Last 3 Weights 09/18/2020 07/12/2020 07/05/2020  Weight (lbs) 156 lb 156 lb 1.4 oz 159 lb 9.8 oz  Weight (kg) 70.761 kg 70.8 kg 72.4 kg     There is no height or weight on file to calculate BMI.  General:  Well nourished, well developed, in mild distress secondary to chest discomfort.  Elderly appearing woman.  Breathing comfortably without respiratory distress. HEENT: normal Lymph: no adenopathy Neck: no JVD Endocrine:  No thryomegaly Vascular: No carotid bruits; FA pulses  2+ bilaterally Cardiac:  normal S1, S2; RRR; no murmur  Lungs:  clear to auscultation bilaterally, no wheezing, rhonchi or rales  Abd: soft, nontender, no hepatomegaly  Ext: no edema Musculoskeletal:  No deformities, BUE and BLE strength normal and equal Skin: warm and dry  Neuro:  CNs 2-12 intact, no focal abnormalities noted Psych:  Normal affect    EKG:  The ECG that was done in the field was personally reviewed and demonstrates normal sinus rhythm with frequent PACs and acute anterolateral STEMI pattern  Relevant CV Studies: Pending  Laboratory Data:  High Sensitivity Troponin:   Recent Labs  Lab 09/19/20 0539 09/19/20 0802  TROPONINIHS 4 4      Chemistry Recent Labs  Lab 09/28/20 1846  NA 140  K 3.8  CL 101  GLUCOSE 124*  BUN 17  CREATININE 0.70    No results for input(s): PROT, ALBUMIN, AST, ALT, ALKPHOS, BILITOT in the last 168 hours. Hematology Recent Labs  Lab 09/28/20 1831 09/28/20 1846  WBC 6.8  --   RBC 5.21*  --   HGB 15.8* 15.6*  HCT 49.1* 46.0  MCV 94.2  --   MCH 30.3  --   MCHC 32.2  --   RDW 13.6  --   PLT 257  --    BNPNo results for input(s): BNP, PROBNP in the last 168 hours.  DDimer No results for input(s): DDIMER in the last 168 hours.   Radiology/Studies:  No results found.   Assessment and Plan:   1. Acute anterolateral STEMI: Plan to proceed emergently with cardiac catheterization and primary PCI.  EKG is diagnostic and the patient is having ongoing 8-9/10 chest pain.  She did not receive heparin or aspirin prior to arrival.  May have to use Cangrelor if she requires intervention because of her inability to take pills right now.  Further plans pending cardiac catheterization results.  I called the patient's daughter  and spoke with her over the telephone to inform her that we are proceeding emergently with cardiac catheterization.  She understands and is advised that I will contact her as soon as the procedure is completed.  Would  anticipate instituting post MI medical therapy with aspirin, ticagrelor, high intensity statin drug, and a beta-blocker, pending cardiac catheterization findings.     {Click here for STEMI Score (TIMI)  THEN REFRESH NOTE   :[id number]} TIMI Risk Score for ST  Elevation MI:   The patient's TIMI risk score is 5, which indicates a 12.4% risk of all cause mortality at 30 days.    For questions or updates, please contact [place] Please consult [email or web address] for contact info under     Signed, Sherren Mocha, MD  09/28/2020 7:25 PM

## 2020-09-29 ENCOUNTER — Encounter (HOSPITAL_COMMUNITY): Payer: Self-pay | Admitting: Cardiovascular Disease

## 2020-09-29 ENCOUNTER — Inpatient Hospital Stay (HOSPITAL_COMMUNITY): Payer: Medicare Other

## 2020-09-29 ENCOUNTER — Other Ambulatory Visit: Payer: Self-pay

## 2020-09-29 DIAGNOSIS — I351 Nonrheumatic aortic (valve) insufficiency: Secondary | ICD-10-CM

## 2020-09-29 DIAGNOSIS — I340 Nonrheumatic mitral (valve) insufficiency: Secondary | ICD-10-CM

## 2020-09-29 DIAGNOSIS — I2109 ST elevation (STEMI) myocardial infarction involving other coronary artery of anterior wall: Secondary | ICD-10-CM

## 2020-09-29 MED ORDER — ADULT MULTIVITAMIN W/MINERALS CH
1.0000 | ORAL_TABLET | Freq: Every day | ORAL | Status: DC
  Administered 2020-09-30 – 2020-10-02 (×2): 1 via ORAL
  Filled 2020-09-29 (×4): qty 1

## 2020-09-29 MED ORDER — LOSARTAN POTASSIUM 25 MG PO TABS
25.0000 mg | ORAL_TABLET | Freq: Every day | ORAL | Status: DC
  Administered 2020-09-29 – 2020-09-30 (×2): 25 mg via ORAL
  Filled 2020-09-29 (×3): qty 1

## 2020-09-29 MED ORDER — PERFLUTREN LIPID MICROSPHERE
1.0000 mL | INTRAVENOUS | Status: AC | PRN
  Administered 2020-09-29: 3 mL via INTRAVENOUS
  Filled 2020-09-29: qty 10

## 2020-09-29 MED ORDER — SODIUM CHLORIDE 0.9% FLUSH
3.0000 mL | Freq: Two times a day (BID) | INTRAVENOUS | Status: DC
  Administered 2020-09-29 – 2020-10-03 (×8): 3 mL via INTRAVENOUS

## 2020-09-29 MED ORDER — ASPIRIN EC 81 MG PO TBEC
81.0000 mg | DELAYED_RELEASE_TABLET | Freq: Every day | ORAL | Status: DC
  Administered 2020-09-29 – 2020-10-03 (×5): 81 mg via ORAL
  Filled 2020-09-29 (×6): qty 1

## 2020-09-29 MED ORDER — ENSURE ENLIVE PO LIQD
237.0000 mL | Freq: Three times a day (TID) | ORAL | Status: DC
  Administered 2020-09-29 – 2020-10-02 (×7): 237 mL via ORAL

## 2020-09-29 MED ORDER — ROSUVASTATIN CALCIUM 20 MG PO TABS
20.0000 mg | ORAL_TABLET | Freq: Every day | ORAL | Status: DC
  Administered 2020-09-29 – 2020-10-03 (×4): 20 mg via ORAL
  Filled 2020-09-29 (×5): qty 1

## 2020-09-29 NOTE — Progress Notes (Signed)
Progress Note  Patient Name: Colleen Glenn Date of Encounter: 09/29/2020  [facility] Cardiologist: Sherren Mocha, MD  Subjective   Mild nausea. No chest pain or dyspnea.   Inpatient Medications    Scheduled Meds: . aspirin EC  81 mg Oral Daily  . buPROPion  300 mg Oral QAC breakfast  . carvedilol  3.125 mg Oral BID WC  . cholecalciferol  5,000 Units Oral Daily  . donepezil  10 mg Oral QHS  . DULoxetine  60 mg Oral Daily  . ezetimibe  10 mg Oral Daily  . gabapentin  300 mg Oral QHS  . heparin  5,000 Units Subcutaneous Q8H  . losartan  25 mg Oral Daily  . mirabegron ER  50 mg Oral Daily  . pantoprazole  40 mg Oral Daily  . raloxifene  60 mg Oral Daily  . rosuvastatin  20 mg Oral Daily  . sodium chloride flush  3 mL Intravenous Q12H   Continuous Infusions: . sodium chloride    . sodium chloride     PRN Meds: sodium chloride, acetaminophen, cyclobenzaprine, diazepam, ondansetron (ZOFRAN) IV, ondansetron, sodium chloride flush   Vital Signs    Vitals:   09/28/20 1952 09/29/20 0100 09/29/20 0528 09/29/20 0544  BP: 123/77 118/68 124/78   Pulse: 86 (!) 58 (!) 106   Resp: 18 17 19    Temp: 97.9 F (36.6 C) 98 F (36.7 C) 98.9 F (37.2 C)   TempSrc: Oral Oral Oral   SpO2: 95% 96% 96%   Weight: 67.1 kg   64.8 kg  Height: 5\' 6"  (1.676 m)       Intake/Output Summary (Last 24 hours) at 09/29/2020 0906 Last data filed at 09/29/2020 6314 Gross per 24 hour  Intake 678.5 ml  Output --  Net 678.5 ml   Last 3 Weights 09/29/2020 09/28/2020 09/18/2020  Weight (lbs) 142 lb 14.4 oz 147 lb 14.9 oz 156 lb  Weight (kg) 64.819 kg 67.1 kg 70.761 kg      Telemetry    Sinus tachycardia with PACs- Personally Reviewed  ECG    No new tracing this morning.  Physical Exam   GEN: No acute distress.   Neck: No JVD Cardiac: RRR, no murmurs, rubs, or gallops.  Respiratory: Clear to auscultation bilaterally. GI: Soft, nontender, non-distended  MS: No edema; No  deformity. Neuro:  Nonfocal  Psych: Normal affect   Labs    High Sensitivity Troponin:   Recent Labs  Lab 09/19/20 0539 09/19/20 0802 09/28/20 1831 09/28/20 2027  TROPONINIHS 4 4 4,566* 4,586*      Chemistry Recent Labs  Lab 09/28/20 1831 09/28/20 1846 09/28/20 2027  NA 139 140  --   K 4.5 3.8  --   CL 100 101  --   CO2 23  --   --   GLUCOSE 126* 124*  --   BUN 19 17  --   CREATININE 0.90 0.70 0.89  CALCIUM 9.0  --   --   PROT 6.3*  --   --   ALBUMIN 3.3*  --   --   AST 55*  --   --   ALT 16  --   --   ALKPHOS 78  --   --   BILITOT 1.1  --   --   GFRNONAA >60  --  >60  ANIONGAP 16*  --   --      Hematology Recent Labs  Lab 09/28/20 1831 09/28/20 1846 09/28/20 2027  WBC  6.8  --  7.2  RBC 5.21*  --  5.15*  HGB 15.8* 15.6* 16.0*  HCT 49.1* 46.0 47.9*  MCV 94.2  --  93.0  MCH 30.3  --  31.1  MCHC 32.2  --  33.4  RDW 13.6  --  13.8  PLT 257  --  243    BNPNo results for input(s): BNP, PROBNP in the last 168 hours.   DDimer No results for input(s): DDIMER in the last 168 hours.   Radiology    CARDIAC CATHETERIZATION  Result Date: 09/28/2020 1.  Severe segmental LV dysfunction, with classic pattern of acute Takotsubo syndrome (LV apical ballooning) 2.  Nonobstructive coronary artery disease with moderate mid LAD stenosis, mild proximal RCA stenosis, and no evidence of high-grade obstruction 3.  Mildly elevated LVEDP Recommendations: Medical therapy, supportive care.  Anticipate 48-hour hospitalization as long as no early complications arise.  Repeat echocardiogram in about 3 months to assess for LV recovery.  Treat with a beta-blocker as tolerated.  DG Chest Port 1 View  Result Date: 09/28/2020 CLINICAL DATA:  STEMI EXAM: PORTABLE CHEST 1 VIEW COMPARISON:  09/19/2020 and prior FINDINGS: No focal consolidation. No pneumothorax or pleural effusion. Stable cardiomediastinal silhouette. Sequela of left shoulder arthroplasty. Epigastric surgical clips.  IMPRESSION: No focal airspace disease. Electronically Signed   By: Primitivo Gauze M.D.   On: 09/28/2020 20:18    Cardiac Studies   Cath: 09/28/20  1.  Severe segmental LV dysfunction, with classic pattern of acute Takotsubo syndrome (LV apical ballooning) 2.  Nonobstructive coronary artery disease with moderate mid LAD stenosis, mild proximal RCA stenosis, and no evidence of high-grade obstruction 3.  Mildly elevated LVEDP  Recommendations: Medical therapy, supportive care.  Anticipate 48-hour hospitalization as long as no early complications arise.  Repeat echocardiogram in about 3 months to assess for LV recovery.  Treat with a beta-blocker as tolerated. Diagnostic Dominance: Right     Patient Profile     79 y.o. female with PMH of HTN, HLD, GERD, depression who presented with chest pain and STEMI called in the ED given ST elevation in anterolateral leads.   Assessment & Plan    1. NSTEMI with Taktosubo cardiomyopathy: underwent cardiac cath with Dr. Burt Knack noted above with mild nonobstructive coronary disease. Noted to have severe segmental LV dysfunction with appearance of Takotsubo.  -- continue Coreg 3.125mg  BID, start low dose losartan and ASA -- echo pending today  2. HTN: has been on Maxzide prior to admission. Will hold this to allow for coreg and initiation of losartan  3. HLD: LDL 118, will switch statin to Crestor 20mg  and continue Zetia  4. COVID: tested positive on 11/30 during a work up in the ED for TIA which was negative. She has been asymptomatic.   For questions or updates, please contact [place] Please consult [email or web address] for contact info under      Signed, Lauree Chandler, MD  09/29/2020, 9:06 AM

## 2020-09-29 NOTE — Progress Notes (Signed)
  Echocardiogram 2D Echocardiogram has been performed with [facility].  Colleen Glenn 09/29/2020, 5:36 PM

## 2020-09-29 NOTE — ED Notes (Signed)
Pt transported to Cath Lab at this time with Charlett Nose, EMT

## 2020-09-29 NOTE — Progress Notes (Addendum)
Initial Nutrition Assessment  DOCUMENTATION CODES:   Non-severe (moderate) malnutrition in context of acute illness/injury  INTERVENTION:    Ensure Enlive po TID, each supplement provides 350 kcal and 20 grams of protein  MVI with minerals daily  NUTRITION DIAGNOSIS:   Moderate Malnutrition related to acute illness (vomiting r/t vertigo) as evidenced by energy intake < 75% for > 7 days,percent weight loss (8.5% weight loss within 1 month).  GOAL:   Patient will meet greater than or equal to 90% of their needs  MONITOR:   PO intake,Supplement acceptance,Labs,Weight trends  REASON FOR ASSESSMENT:   Malnutrition Screening Tool    ASSESSMENT:   79 yo female admitted with STEMI. PMH includes diverticulosis, PUD, osteopenia, vitamin D deficiency, anxiety, vertigo, GERD, HLD, mild HTN.  Patient has lost 8.5% of usual weight within the past month. She reports that she hasn't been eating d/t vomiting every time she moves around, which she thinks is r/t vertigo. This has been going on for the past few months. She has asked her RN for nausea medication. Diagnosed with COVID during last admission (Nov 30, 21). She has stayed at home since discharge and she has had no COVID symptoms. She denies any loss of taste or smell. She agreed to try Ensure supplements to maximize protein and calorie intake.    Labs reviewed. Medications reviewed and include vitamin D.  Patient meets criteria for moderate malnutrition with 8.5% weight loss x 1 month and intake meeting < 75% of estimated energy requirement for > 7 days.   Diet Order:   Diet Order            Diet Heart Room service appropriate? Yes; Fluid consistency: Thin  Diet effective now                 EDUCATION NEEDS:   No education needs have been identified at this time  Skin:  Skin Assessment: Reviewed RN Assessment  Last BM:  no BM documented  Height:   Ht Readings from Last 1 Encounters:  09/28/20 5\' 6"  (1.676 m)     Weight:   Wt Readings from Last 1 Encounters:  09/29/20 64.8 kg    Ideal Body Weight:  59.1 kg  BMI:  Body mass index is 23.06 kg/m.  Estimated Nutritional Needs:   Kcal:  1700-1900  Protein:  80-95 gm  Fluid:  >/= 1.7 L    Lucas Mallow, RD, LDN, CNSC Please refer to Amion for contact information.

## 2020-09-29 NOTE — ED Notes (Signed)
Pt BIB [facility] from home with ABD Pain.  Pt has Hx of ABD Pain due to Gallbladder issues but upon assessment [facility] noted ST Elevation on pts EKG.  STEMI activated. VSS with EMS. No ASA given to due Nausea. 1SL NTG given and 4 mg Zofran given IV

## 2020-09-30 DIAGNOSIS — R1013 Epigastric pain: Secondary | ICD-10-CM

## 2020-09-30 LAB — ECHOCARDIOGRAM LIMITED
Height: 66 in
Weight: 2286.4 oz

## 2020-09-30 LAB — TROPONIN I (HIGH SENSITIVITY): Troponin I (High Sensitivity): 2517 ng/L (ref <18)

## 2020-09-30 MED ORDER — ONDANSETRON 4 MG PO TBDP
4.0000 mg | ORAL_TABLET | Freq: Four times a day (QID) | ORAL | Status: DC
  Administered 2020-09-30 – 2020-10-01 (×2): 4 mg via ORAL
  Filled 2020-09-30 (×5): qty 1

## 2020-09-30 MED ORDER — ISOSORBIDE DINITRATE 10 MG PO TABS
10.0000 mg | ORAL_TABLET | Freq: Two times a day (BID) | ORAL | Status: DC
  Administered 2020-09-30: 16:00:00 10 mg via ORAL
  Filled 2020-09-30 (×3): qty 1

## 2020-09-30 MED ORDER — CLOPIDOGREL BISULFATE 75 MG PO TABS
75.0000 mg | ORAL_TABLET | Freq: Every day | ORAL | Status: DC
  Administered 2020-09-30 – 2020-10-01 (×3): 75 mg via ORAL
  Filled 2020-09-30 (×3): qty 1

## 2020-09-30 MED ORDER — CARVEDILOL 6.25 MG PO TABS
6.2500 mg | ORAL_TABLET | Freq: Two times a day (BID) | ORAL | Status: DC
  Administered 2020-09-30 (×2): 6.25 mg via ORAL
  Filled 2020-09-30 (×2): qty 1

## 2020-09-30 NOTE — Consult Note (Addendum)
Consultation  Referring Provider:  Cardiology/ Dr Burt Knack Primary Care Physician: Dr. Joylene Draft Primary Gastroenterologist:  Dr. Henrene Pastor  Reason for Consultation:   Persistent  nausea/ vomiting  HPI: Colleen Glenn is a 79 y.o. female, who we are asked to see for persistent complaints of nausea and vomiting. Patient is known to Dr. Henrene Pastor but not seen since 2019. She has history of diverticulosis, and colon polyps. Patient was admitted on 09/28/2020 with complaints of chest pain. This was in the setting of diagnosis of COVID-19 on 09/19/2020 which was made when she went to the emergency room with complaints of not feeling well in general and some confusion. She has not felt well since that time with significant fatigue weakness, and has had persistent daily nausea with inability to take p.o.'s and intermittent vomiting. She has also had some upper abdominal discomfort, no diarrhea. She has been unaware of any fever or chills at home. On the day admit she had complained of chest pain in addition to her other symptoms and was brought to the ER. Troponins were positive and she went to cardiac cath which showed nonobstructive coronary disease. She has an EF of about 35%, and is felt to have had an anterior lateral MI secondary to Takotsubo cardiomyopathy. She is now on Plavix. She is continuing to complain of nausea since admit. Today for the first time she tried a little bit of solid food and has not vomited. In the background of this she has been having intermittent episodes over the past 4 months with epigastric abdominal pain which may last for a few hours sometimes radiates around into the right side and has been associated with nausea and vomiting. She did have ultrasound done on 1123 22,021 which showed a 12 mm stone in the neck of the gallbladder, sludge in the gallbladder and a CBD of 5 mm, no gallbladder wall thickening. Patient's daughter states that they've been trying to get an  appointment with surgery but could not be seen until the end of January. Labs on admit WBC of 6.8, hemoglobin 15.8/hematocrit of 49.1 LFTs normal with exception of AST of 55.  She has been on Protonix once daily and is on Zofran IV and p.o. as needed. Patient has been very focused on her gallbladder, and patient and daughter asking what they can do to prevent further gallbladder attacks and what she can do for the ongoing nausea.   Past Medical History:  Diagnosis Date  . Anxiety    on meds  . Arthritis    OA bil shoulders, hands, back  . Back pain   . BPPV (benign paroxysmal positional vertigo) 10/2019  . Bursitis   . Colon polyps    adenomatous  . Colon polyps   . Depression    on meds  . Diverticulosis   . GERD (gastroesophageal reflux disease)    on meds  . Hemorrhoids   . Hyperlipidemia    on meds  . Mild hypertension    white coat syndrome- on meds  . OAB (overactive bladder)   . Osteopenia   . Panic disorder 11/2012  . Plantar fasciitis    hx of  . PUD (peptic ulcer disease)   . Shingles   . Tremor    mild action tremor suspected 2020  . Vitamin D deficiency    on meds    Past Surgical History:  Procedure Laterality Date  . ABDOMINAL HYSTERECTOMY    . APPENDECTOMY    . BACK SURGERY    .  COLONOSCOPY  2016   JP-MAC-TA  . FOOT SURGERY     bilateral   . HEMORRHOID SURGERY    . JOINT REPLACEMENT Right 2014  . laproscopic knee surgery Right    2018  . LEFT HEART CATH AND CORONARY ANGIOGRAPHY N/A 09/28/2020   Procedure: LEFT HEART CATH AND CORONARY ANGIOGRAPHY;  Surgeon: Sherren Mocha, MD;  Location: [facility];  Service: Cardiovascular;  Laterality: N/A;  . LUMBAR LAMINECTOMY/DECOMPRESSION MICRODISCECTOMY  05/07/2012   Procedure: LUMBAR LAMINECTOMY/DECOMPRESSION MICRODISCECTOMY 1 LEVEL;  Surgeon: Eustace Moore, MD;  Location: [facility];  Service: Neurosurgery;  Laterality: Right;  Lumbar Laminectomy Decompression Microdiscectomy Lumbar Three-Four   . multiple foot surgeries    . POLYPECTOMY  2016   TA  . REVERSE SHOULDER ARTHROPLASTY Left 07/12/2020   Procedure: REVERSE SHOULDER ARTHROPLASTY;  Surgeon: Hiram Gash, MD;  Location: [facility];  Service: Orthopedics;  Laterality: Left;  . ROTATOR CUFF REPAIR     right side  . TONSILLECTOMY    . TOTAL HIP ARTHROPLASTY  11/16/2012   Procedure: TOTAL HIP ARTHROPLASTY;  Surgeon: Gearlean Alf, MD;  Location: [facility];  Service: Orthopedics;  Laterality: Right;  Right Total Hip Arthroplasty  . TUBAL LIGATION    . VAGOTOMY     approx. 35 years ago, states has a clamp mid epigastric area    Prior to Admission medications   Medication Sig Start Date End Date Taking? Authorizing Provider  acetaminophen (TYLENOL) 325 MG tablet Take 325-650 mg by mouth every 6 (six) hours as needed for mild pain.   Yes [provider]  aspirin EC 81 MG tablet Take 81 mg by mouth at bedtime. Swallow whole.   Yes [provider]  buPROPion (WELLBUTRIN XL) 300 MG 24 hr tablet Take 300 mg by mouth daily before breakfast.    Yes [provider]  Cholecalciferol (VITAMIN D-3) 125 MCG (5000 UT) TABS Take 5,000 Units by mouth daily after breakfast.   Yes [provider]  donepezil (ARICEPT) 10 MG tablet Take 10 mg by mouth at bedtime.   Yes [provider]  DULoxetine (CYMBALTA) 60 MG capsule Take 60 mg by mouth daily. 10/25/19  Yes [provider]  ezetimibe (ZETIA) 10 MG tablet Take 10 mg by mouth daily. 11/18/19  Yes [provider]  gabapentin (NEURONTIN) 300 MG capsule TAKE 1 CAPSULE(300 MG) BY MOUTH AT BEDTIME Patient taking differently: Take 300 mg by mouth at bedtime. 07/11/20  Yes Melvenia Beam, MD  MYRBETRIQ 50 MG TB24 tablet Take 50 mg by mouth daily. 03/06/20  Yes [provider]  naproxen sodium (ALEVE) 220 MG tablet Take 220-440 mg by mouth 2 (two) times daily as needed (for arthritic pain).   Yes [provider]   omeprazole (PRILOSEC OTC) 20 MG tablet Take 20 mg by mouth daily before breakfast.   Yes [provider]  ondansetron (ZOFRAN-ODT) 8 MG disintegrating tablet Take 8 mg by mouth every 8 (eight) hours as needed for nausea/vomiting (DISSOLVE ORALLY). 09/13/20  Yes [provider]  polyethylene glycol powder (GLYCOLAX/MIRALAX) 17 GM/SCOOP powder Take 17 g by mouth daily.   Yes [provider]  raloxifene (EVISTA) 60 MG tablet Take 60 mg by mouth daily.   Yes [provider]  simvastatin (ZOCOR) 20 MG tablet Take 20 mg by mouth every other day. 07/30/20  Yes [provider]  triamterene-hydrochlorothiazide (MAXZIDE-25) 37.5-25 MG per tablet Take 1 tablet by mouth daily in the  afternoon.    Yes [provider]  cyclobenzaprine (FLEXERIL) 5 MG tablet Take 5 mg by mouth 3 (three) times daily as needed for muscle spasms.    [provider]  vitamin B-12 (CYANOCOBALAMIN) 1000 MCG tablet Take 1,000 mcg by mouth daily.    [provider]    Current Facility-Administered Medications  Medication Dose Route Frequency Provider Last Rate Last Admin  . 0.9 %  sodium chloride infusion  250 mL Intravenous PRN Sherren Mocha, MD      . acetaminophen (TYLENOL) tablet 650 mg  650 mg Oral Q4H PRN Sherren Mocha, MD      . aspirin EC tablet 81 mg  81 mg Oral Daily Reino Bellis B, NP   81 mg at 09/30/20 1137  . buPROPion (WELLBUTRIN XL) 24 hr tablet 300 mg  300 mg Oral QAC breakfast Sherren Mocha, MD   300 mg at 09/30/20 1138  . carvedilol (COREG) tablet 6.25 mg  6.25 mg Oral BID WC Almyra Deforest, PA   6.25 mg at 09/30/20 1143  . cholecalciferol (VITAMIN D3) tablet 5,000 Units  5,000 Units Oral Daily Sherren Mocha, MD   5,000 Units at 09/30/20 1136  . clopidogrel (PLAVIX) tablet 75 mg  75 mg Oral Daily Josue Hector, MD      . cyclobenzaprine (FLEXERIL) tablet 5 mg  5 mg Oral TID PRN Sherren Mocha, MD      . diazepam (VALIUM) tablet 5 mg   5 mg Oral Q4H PRN Sherren Mocha, MD      . donepezil (ARICEPT) tablet 10 mg  10 mg Oral Pollie Meyer, MD   10 mg at 09/29/20 2325  . DULoxetine (CYMBALTA) DR capsule 60 mg  60 mg Oral Daily Sherren Mocha, MD   60 mg at 09/30/20 1136  . ezetimibe (ZETIA) tablet 10 mg  10 mg Oral Daily Sherren Mocha, MD   10 mg at 09/30/20 1136  . feeding supplement (ENSURE ENLIVE / ENSURE PLUS) liquid 237 mL  237 mL Oral TID BM Sherren Mocha, MD   237 mL at 09/30/20 1306  . gabapentin (NEURONTIN) capsule 300 mg  300 mg Oral QHS Sherren Mocha, MD   300 mg at 09/29/20 2324  . heparin injection 5,000 Units  5,000 Units Subcutaneous Q8H Sherren Mocha, MD   5,000 Units at 09/30/20 [phone]  . isosorbide dinitrate (ISORDIL) tablet 10 mg  10 mg Oral BID Josue Hector, MD      . losartan (COZAAR) tablet 25 mg  25 mg Oral Daily Reino Bellis B, NP   25 mg at 09/30/20 1138  . mirabegron ER (MYRBETRIQ) tablet 50 mg  50 mg Oral Daily Sherren Mocha, MD   50 mg at 09/30/20 1138  . multivitamin with minerals tablet 1 tablet  1 tablet Oral Daily Sherren Mocha, MD   1 tablet at 09/30/20 1136  . ondansetron (ZOFRAN) injection 4 mg  4 mg Intravenous Q6H PRN Sherren Mocha, MD   4 mg at 09/30/20 1004  . ondansetron (ZOFRAN-ODT) disintegrating tablet 4 mg  4 mg Oral Q6H Esterwood, Amy S, PA-C      . pantoprazole (PROTONIX) EC tablet 40 mg  40 mg Oral Daily Pierce, Dwayne A, RPH   40 mg at 09/30/20 1137  . raloxifene (EVISTA) tablet 60 mg  60 mg Oral Daily Sherren Mocha, MD   60 mg at 09/30/20 1135  . rosuvastatin (CRESTOR) tablet 20 mg  20 mg Oral Daily Cheryln Manly, NP  20 mg at 09/30/20 1137  . sodium chloride 0.9 % bolus 500 mL  500 mL Intravenous Once Sherren Mocha, MD      . sodium chloride flush (NS) 0.9 % injection 3 mL  3 mL Intravenous PRN Sherren Mocha, MD      . sodium chloride flush (NS) 0.9 % injection 3 mL  3 mL Intravenous Q12H Sherren Mocha, MD   3 mL at 09/30/20 1138     Allergies as of 09/28/2020  . (No Known Allergies)    Family History  Problem Relation Age of Onset  . Colon polyps Mother   . Stroke Mother   . Hypertension Mother   . Congestive Heart Failure Mother   . Stroke Other        several family members on mother's side  . Lung cancer Father 62  . Heart disease Father   . Colon cancer Neg Hx   . Rectal cancer Neg Hx   . Stomach cancer Neg Hx   . Esophageal cancer Neg Hx     Social History   Socioeconomic History  . Marital status: Widowed    Spouse name: Not on file  . Number of children: 3  . Years of education: cosmetology degree  . Highest education level: GED or equivalent  Occupational History  . Occupation: Retired  Tobacco Use  . Smoking status: Former Smoker    Packs/day: 1.00    Years: 20.00    Pack years: 20.00    Types: Cigarettes    Quit date: 1983    Years since quitting: 38.9  . Smokeless tobacco: Never Used  . Tobacco comment: 45 years ago  Vaping Use  . Vaping Use: Never used  Substance and Sexual Activity  . Alcohol use: Yes    Alcohol/week: 2.0 - 4.0 standard drinks    Types: 2 - 4 Glasses of wine per week    Comment: every 3-4 days  . Drug use: No  . Sexual activity: Not Currently    Partners: Male  Other Topics Concern  . Not on file  Social History Narrative   Lives alone   Right handed   Social Determinants of Health   Financial Resource Strain: Not on file  Food Insecurity: Not on file  Transportation Needs: Not on file  Physical Activity: Not on file  Stress: Not on file  Social Connections: Not on file  Intimate Partner Violence: Not on file    Review of Systems: Pertinent positive and negative review of systems were noted in the above HPI section.  All other review of systems was otherwise negative.  Physical Exam: Vital signs in last 24 hours: Temp:  [98 F (36.7 C)-98.2 F (36.8 C)] 98 F (36.7 C) (12/11 1432) Pulse Rate:  [51-102] 51 (12/11 1432) Resp:  [15-19]  15 (12/11 0800) BP: (86-127)/(36-75) 86/36 (12/11 1432) SpO2:  [94 %-95 %] 95 % (12/11 0556) Weight:  [64.3 kg] 64.3 kg (12/11 0556) Last BM Date: 09/28/20 General:   Alert,  Well-developed, well-nourished, ill appearing eld WF pleasant and cooperative in NAD Head:  Normocephalic and atraumatic. Eyes:  Sclera clear, no icterus.   Conjunctiva pink. Ears:  Normal auditory acuity. Nose:  No deformity, discharge,  or lesions. Mouth:  No deformity or lesions.   Neck:  Supple; no masses or thyromegaly. Lungs:  Clear throughout to auscultation.   No wheezes, crackles, or rhonchi.  Heart:  Regular rate and rhythm; syst murmur Abdomen:  Soft,tender epigastrium nonguarding BS active,nonpalp  mass or hsm.   Rectal:  Not done Msk:  Symmetrical without gross deformities. . Pulses:  Normal pulses noted. Extremities:  Without clubbing or edema. Neurologic:  Alert and  oriented x4;  grossly normal neurologically. Skin:  Intact without significant lesions or rashes.. Psych:  Alert and cooperative. Normal mood and affect.  Intake/Output from previous day: 12/10 0701 - 12/11 0700 In: 173 [P.O.:120; I.V.:53] Out: -  Intake/Output this shift: No intake/output data recorded.  Lab Results: Recent Labs    09/28/20 1831 09/28/20 1846 09/28/20 2027  WBC 6.8  --  7.2  HGB 15.8* 15.6* 16.0*  HCT 49.1* 46.0 47.9*  PLT 257  --  243   BMET Recent Labs    09/28/20 1831 09/28/20 1846 09/28/20 2027  NA 139 140  --   K 4.5 3.8  --   CL 100 101  --   CO2 23  --   --   GLUCOSE 126* 124*  --   BUN 19 17  --   CREATININE 0.90 0.70 0.89  CALCIUM 9.0  --   --    LFT Recent Labs    09/28/20 1831  PROT 6.3*  ALBUMIN 3.3*  AST 55*  ALT 16  ALKPHOS 78  BILITOT 1.1   PT/INR Recent Labs    09/28/20 1831  LABPROT 12.3  INR 1.0   Hepatitis Panel No results for input(s): HEPBSAG, HCVAB, HEPAIGM, HEPBIGM in the last 72 hours.   IMPRESSION:  56. 79 year old white female admitted 09/28/2020  with chest pain and found to have anterolateral MI in setting of Takotsubo cardiomyopathy. Nonobstructive coronary disease on cath and EF of 35%. This is in the setting of diagnosis of COVID-19 09/19/2020 which may have precipitated  2. Persistent nausea and vomiting over the past 2 weeks with inability to take p.o.'s. Associated significant weakness  These more acute symptoms are likely secondary to COVID-19.   3. 44-monthhistory of intermittent epigastric pain with nausea and vomiting consistent with biliary colic. Recent ultrasound confirmed stone in the neck of the gallbladder and gallbladder sludge no evidence of cholecystitis and normal CBD. LFTs normal with exception of AST of 55  4. anxiety 5. Memory loss 6. History of BPPV 7. Depression 8. History of hypertension  Recommendation:  Continue Protonix 40 mg qam Will change Zofran ODT to 4 mg every 6 hours around-the-clock and can use IV as needed. Low-fat diet as tolerated bland We'll repeat upper abdominal ultrasound to assure no evidence of cholecystitis We'll ask surgery to see in consultation, anticipate need for eventual cholecystectomy Hopefully she will have gradual improvement in the persistent nausea over the next few weeks. That is has been seen frequently in patients with COVID-19.   09/30/2020, 4:05 PM  Amy ETrellis Paganini PA-C    Attending Physician Note   I have taken a history, examined the patient and reviewed the chart. I agree with the Advanced Practitioner's note, impression and recommendations.  Impression: * Persistent N/V possibly due to recent Covid-19 infection * Intermittent epigastric pain with N/V for several months consistent with symptomatic cholelithiasis * Anterolateral MI in setting of Takotsubo cardiomyopathy  Recommendation: * Change Zofran 480mto RTC, ODT or IV * Continue Protonix 40 mg qd * Abd [place] [facility] surgery consult, anticipate need for cholecystectomy when cleared by  Cardiology  MaLucio EdwardMD [facility]

## 2020-09-30 NOTE — Progress Notes (Addendum)
Progress Note  Patient Name: Colleen Glenn Date of Encounter: 09/30/2020  [facility] Cardiologist: Colleen Mocha, MD   Subjective   Denies any CP or SOB. Has been nauseated for weeks now, want to see a GI doctor at some point.   Inpatient Medications    Scheduled Meds: . aspirin EC  81 mg Oral Daily  . buPROPion  300 mg Oral QAC breakfast  . carvedilol  3.125 mg Oral BID WC  . cholecalciferol  5,000 Units Oral Daily  . donepezil  10 mg Oral QHS  . DULoxetine  60 mg Oral Daily  . ezetimibe  10 mg Oral Daily  . feeding supplement  237 mL Oral TID BM  . gabapentin  300 mg Oral QHS  . heparin  5,000 Units Subcutaneous Q8H  . losartan  25 mg Oral Daily  . mirabegron ER  50 mg Oral Daily  . multivitamin with minerals  1 tablet Oral Daily  . pantoprazole  40 mg Oral Daily  . raloxifene  60 mg Oral Daily  . rosuvastatin  20 mg Oral Daily  . sodium chloride flush  3 mL Intravenous Q12H   Continuous Infusions: . sodium chloride    . sodium chloride     PRN Meds: sodium chloride, acetaminophen, cyclobenzaprine, diazepam, ondansetron (ZOFRAN) IV, ondansetron, sodium chloride flush   Vital Signs    Vitals:   09/29/20 0900 09/29/20 2000 09/29/20 2333 09/30/20 0556  BP: 132/88 (!) 118/54  127/75  Pulse: (!) 107 94 98 (!) 102  Resp: 20 18  19   Temp: 98 F (36.7 C)  98.1 F (36.7 C) 98.2 F (36.8 C)  TempSrc: Oral  Oral Oral  SpO2: 93% 94%  95%  Weight:    64.3 kg  Height:        Intake/Output Summary (Last 24 hours) at 09/30/2020 0752 Last data filed at 09/29/2020 1600 Gross per 24 hour  Intake 173 ml  Output --  Net 173 ml   Last 3 Weights 09/30/2020 09/29/2020 09/28/2020  Weight (lbs) 141 lb 12.8 oz 142 lb 14.4 oz 147 lb 14.9 oz  Weight (kg) 64.32 kg 64.819 kg 67.1 kg      Telemetry    Sinus rhythm with frequent PVCs and occasional bigemny - Personally Reviewed  ECG    NSR with minimal ST elevation in the inferior leads - Personally  Reviewed  Physical Exam   GEN: No acute distress.   Neck: No JVD Cardiac: RRR, no murmurs, rubs, or gallops.  Respiratory: Clear to auscultation bilaterally. GI: Soft, nontender, non-distended  MS: No edema; No deformity. Neuro:  Nonfocal  Psych: Normal affect   Labs    High Sensitivity Troponin:   Recent Labs  Lab 09/19/20 0539 09/19/20 0802 09/28/20 1831 09/28/20 2027  TROPONINIHS 4 4 4,566* 4,586*      Chemistry Recent Labs  Lab 09/28/20 1831 09/28/20 1846 09/28/20 2027  NA 139 140  --   K 4.5 3.8  --   CL 100 101  --   CO2 23  --   --   GLUCOSE 126* 124*  --   BUN 19 17  --   CREATININE 0.90 0.70 0.89  CALCIUM 9.0  --   --   PROT 6.3*  --   --   ALBUMIN 3.3*  --   --   AST 55*  --   --   ALT 16  --   --   ALKPHOS 78  --   --  BILITOT 1.1  --   --   GFRNONAA >60  --  >60  ANIONGAP 16*  --   --      Hematology Recent Labs  Lab 09/28/20 1831 09/28/20 1846 09/28/20 2027  WBC 6.8  --  7.2  RBC 5.21*  --  5.15*  HGB 15.8* 15.6* 16.0*  HCT 49.1* 46.0 47.9*  MCV 94.2  --  93.0  MCH 30.3  --  31.1  MCHC 32.2  --  33.4  RDW 13.6  --  13.8  PLT 257  --  243    BNPNo results for input(s): BNP, PROBNP in the last 168 hours.   DDimer No results for input(s): DDIMER in the last 168 hours.   Radiology    CARDIAC CATHETERIZATION  Result Date: 09/28/2020 1.  Severe segmental LV dysfunction, with classic pattern of acute Takotsubo syndrome (LV apical ballooning) 2.  Nonobstructive coronary artery disease with moderate mid LAD stenosis, mild proximal RCA stenosis, and no evidence of high-grade obstruction 3.  Mildly elevated LVEDP Recommendations: Medical therapy, supportive care.  Anticipate 48-hour hospitalization as long as no early complications arise.  Repeat echocardiogram in about 3 months to assess for LV recovery.  Treat with a beta-blocker as tolerated.  DG Chest Port 1 View  Result Date: 09/28/2020 CLINICAL DATA:  STEMI EXAM: PORTABLE CHEST 1  VIEW COMPARISON:  09/19/2020 and prior FINDINGS: No focal consolidation. No pneumothorax or pleural effusion. Stable cardiomediastinal silhouette. Sequela of left shoulder arthroplasty. Epigastric surgical clips. IMPRESSION: No focal airspace disease. Electronically Signed   By: Primitivo Gauze M.D.   On: 09/28/2020 20:18   ECHOCARDIOGRAM LIMITED  Result Date: 09/30/2020    ECHOCARDIOGRAM LIMITED REPORT   Patient Name:   Colleen Glenn Jue Date of Exam: 09/29/2020 Medical Rec #:  [id number]           Height:       66.0 in Accession #:    [id number]          Weight:       142.9 lb Date of Birth:  [birth year]           BSA:          1.734 m Patient Age:    79 years            BP:           132/88 mmHg Patient Gender: F                   HR:           88 bpm. Exam Location:  Inpatient Procedure: Limited Echo, Cardiac Doppler, Color Doppler and Intracardiac            Opacification Agent Indications:    Acute myocardial infarction  History:        Patient has no prior history of Echocardiogram examinations.                 Acute MI; Risk Factors:Former Smoker. GERD.  Sonographer:    Colleen Glenn RDCS (AE) Sonographer#2:  Colleen Glenn RDCS (AE) Referring Phys: Colleen Glenn  1. No thrombus (Definity utilized). Left ventricular ejection fraction, by estimation, is 35 to 40%. The left ventricle has moderately decreased function. The left ventricle demonstrates regional wall motion abnormalities (see scoring diagram/findings for description). Left ventricular diastolic parameters are consistent with Grade I diastolic dysfunction (impaired relaxation).  2. The mitral valve is degenerative. Mild mitral valve regurgitation. No evidence  of mitral stenosis. Moderate mitral annular calcification.  3. The aortic valve is calcified. Aortic valve regurgitation is mild. Mild aortic valve sclerosis is present, with no evidence of aortic valve stenosis.  4. There is normal pulmonary artery systolic  pressure. The estimated right ventricular systolic pressure is 59.1 mmHg.  5. The inferior vena cava is normal in size with greater than 50% respiratory variability, suggesting right atrial pressure of 3 mmHg. FINDINGS  Left Ventricle: No thrombus (Definity utilized). Left ventricular ejection fraction, by estimation, is 35 to 40%. The left ventricle has moderately decreased function. The left ventricle demonstrates regional wall motion abnormalities. Definity contrast  agent was given IV to delineate the left ventricular endocardial borders. Left ventricular diastolic parameters are consistent with Grade I diastolic dysfunction (impaired relaxation).  LV Wall Scoring: The mid and distal anterior septum, mid and distal inferior wall, and mid inferoseptal segment are akinetic. Right Ventricle: There is normal pulmonary artery systolic pressure. The tricuspid regurgitant velocity is 2.00 m/s, and with an assumed right atrial pressure of 3 mmHg, the estimated right ventricular systolic pressure is 63.8 mmHg. Left Atrium: Left atrial size was normal in size. Right Atrium: Right atrial size was normal in size. Pericardium: There is no evidence of pericardial effusion. Mitral Valve: The mitral valve is degenerative in appearance. There is moderate thickening of the mitral valve leaflet(s). There is moderate calcification of the mitral valve leaflet(s). Moderate mitral annular calcification. Mild mitral valve regurgitation. No evidence of mitral valve stenosis. Tricuspid Valve: Tricuspid valve regurgitation is trivial. Aortic Valve: The aortic valve is calcified. Aortic valve regurgitation is mild. Mild aortic valve sclerosis is present, with no evidence of aortic valve stenosis. Pulmonic Valve: The pulmonic valve was not well visualized. Pulmonic valve regurgitation is not visualized. Aorta: The aortic root is normal in size and structure. Venous: The inferior vena cava is normal in size with greater than 50% respiratory  variability, suggesting right atrial pressure of 3 mmHg. IVC IVC diam: 1.50 cm LEFT ATRIUM             Index LA Vol (A2C):   44.8 ml 25.84 ml/m LA Vol (A4C):   45.0 ml 25.96 ml/m LA Biplane Vol: 47.9 ml 27.63 ml/m  TRICUSPID VALVE TR Peak grad:   16.0 mmHg TR Vmax:        200.00 cm/s Candee Furbish MD Electronically signed by Candee Furbish MD Signature Date/Time: 09/30/2020/7:02:15 AM    Final     Cardiac Studies   Cath 09/28/2020 1.  Severe segmental LV dysfunction, with classic pattern of acute Takotsubo syndrome (LV apical ballooning) 2.  Nonobstructive coronary artery disease with moderate mid LAD stenosis, mild proximal RCA stenosis, and no evidence of high-grade obstruction 3.  Mildly elevated LVEDP   Recommendations: Medical therapy, supportive care.  Anticipate 48-hour hospitalization as long as no early complications arise.  Repeat echocardiogram in about 3 months to assess for LV recovery.  Treat with a beta-blocker as tolerated.    Echo 09/29/2020 1. No thrombus (Definity utilized). Left ventricular ejection fraction,  by estimation, is 35 to 40%. The left ventricle has moderately decreased  function. The left ventricle demonstrates regional wall motion  abnormalities (see scoring diagram/findings  for description). Left ventricular diastolic parameters are consistent  with Grade I diastolic dysfunction (impaired relaxation).   2. The mitral valve is degenerative. Mild mitral valve regurgitation. No  evidence of mitral stenosis. Moderate mitral annular calcification.   3. The aortic valve is calcified. Aortic valve regurgitation  is mild.  Mild aortic valve sclerosis is present, with no evidence of aortic valve  stenosis.   4. There is normal pulmonary artery systolic pressure. The estimated  right ventricular systolic pressure is 30.0 mmHg.   5. The inferior vena cava is normal in size with greater than 50%  respiratory variability, suggesting right atrial pressure of 3 mmHg.    Patient Profile     79 y.o. female with no prior cardiac history presented with chest and EKG changes concerning for anterolateral STEMI. Cardiac cath showed nonobstructive CAD with segmental LV dysfunction with appearance of takotsubo cardiomyopathy. Note, recent positive COVID test on 11/30 during workup for TIA which was negative.   Assessment & Plan    1. NSTEMI with takotsubo cardiomyopathy  - cath showed nonobstructive CAD, regional wall motion concerning for takotsubo cardiomyopathy  - Echo 09/29/2020 EF 35-40%.   - started on coreg and losartan. Plan repeat echo in 3 month.   - increasing carvedilol to 6.25mg  BID given frequent PVCs. Likely can be discharged soon. Radial cath site some bruising, slow to answer questions, unclear baseline mental status.   2. HTN: continue coreg and losartan. Increase carvedilol to 6.25mg  BID  3. HLD: on crestor  4. COVID 19 positive: positive on 09/19/2020  5. Frequent PVCs: increase coreg, if EF remain low despite medical therapy, may need holter monitor later to assess PVC burden  6. Nausea: she says nausea has been going on for several weeks, will need GI eval. Will defer to MD regarding timing.    For questions or updates, please contact [place] Please consult [email or web address] for contact info under        Signed, Almyra Deforest, [place]  09/30/2020, 7:52 AM    Patient examined chart reviewed. Takatsubo on Rx EF 35-40% exam otherwise with no murmur clear lungs small bruise in right radial cath site She was supposed to see Dr Henrene Pastor with [facility] but cancelled due to [place] will send him a note to reschedule Start protonix check LfTls amylase and lipase.  Note ECG 12/9 with persistent ST elevation and she has ST elevation on telemetry but no chest pain and known anatomy from cath 09/28/20 nonobstructive   D/c in am   Jenkins Rouge MD [facility]

## 2020-10-01 ENCOUNTER — Inpatient Hospital Stay (HOSPITAL_COMMUNITY): Payer: Medicare Other

## 2020-10-01 LAB — CBC
HCT: 39.3 % (ref 36.0–46.0)
Hemoglobin: 13.2 g/dL (ref 12.0–15.0)
MCH: 31.2 pg (ref 26.0–34.0)
MCHC: 33.6 g/dL (ref 30.0–36.0)
MCV: 92.9 fL (ref 80.0–100.0)
Platelets: 226 10*3/uL (ref 150–400)
RBC: 4.23 MIL/uL (ref 3.87–5.11)
RDW: 13.6 % (ref 11.5–15.5)
WBC: 8.7 10*3/uL (ref 4.0–10.5)
nRBC: 0 % (ref 0.0–0.2)

## 2020-10-01 LAB — COMPREHENSIVE METABOLIC PANEL
ALT: 17 U/L (ref 0–44)
AST: 34 U/L (ref 15–41)
Albumin: 2.9 g/dL — ABNORMAL LOW (ref 3.5–5.0)
Alkaline Phosphatase: 69 U/L (ref 38–126)
Anion gap: 10 (ref 5–15)
BUN: 32 mg/dL — ABNORMAL HIGH (ref 8–23)
CO2: 27 mmol/L (ref 22–32)
Calcium: 8.9 mg/dL (ref 8.9–10.3)
Chloride: 98 mmol/L (ref 98–111)
Creatinine, Ser: 1.36 mg/dL — ABNORMAL HIGH (ref 0.44–1.00)
GFR, Estimated: 40 mL/min — ABNORMAL LOW (ref >60)
Glucose, Bld: 102 mg/dL — ABNORMAL HIGH (ref 70–99)
Potassium: 3.8 mmol/L (ref 3.5–5.1)
Sodium: 135 mmol/L (ref 135–145)
Total Bilirubin: 0.9 mg/dL (ref 0.3–1.2)
Total Protein: 5.9 g/dL — ABNORMAL LOW (ref 6.5–8.1)

## 2020-10-01 LAB — LIPASE, BLOOD: Lipase: 24 U/L (ref 11–51)

## 2020-10-01 LAB — MAGNESIUM: Magnesium: 2.1 mg/dL (ref 1.7–2.4)

## 2020-10-01 LAB — TSH: TSH: 3.148 u[IU]/mL (ref 0.350–4.500)

## 2020-10-01 MED ORDER — PROMETHAZINE HCL 25 MG/ML IJ SOLN
12.5000 mg | Freq: Four times a day (QID) | INTRAMUSCULAR | Status: DC | PRN
  Administered 2020-10-01 – 2020-10-03 (×4): 12.5 mg via INTRAVENOUS
  Filled 2020-10-01 (×4): qty 1

## 2020-10-01 MED ORDER — PANTOPRAZOLE SODIUM 40 MG IV SOLR: 40.0000 mg | INTRAVENOUS | Status: DC

## 2020-10-01 MED ORDER — POTASSIUM CHLORIDE CRYS ER 20 MEQ PO TBCR: 20.0000 meq | EXTENDED_RELEASE_TABLET | Freq: Once | ORAL | Status: DC

## 2020-10-01 MED ORDER — SODIUM CHLORIDE 0.9 % IV SOLN: INTRAVENOUS | Status: AC

## 2020-10-01 MED ORDER — CARVEDILOL 3.125 MG PO TABS
3.1250 mg | ORAL_TABLET | Freq: Two times a day (BID) | ORAL | Status: DC
  Administered 2020-10-01: 19:00:00 3.125 mg via ORAL
  Filled 2020-10-01: qty 1

## 2020-10-01 NOTE — Progress Notes (Addendum)
Patient ID: Colleen Glenn, female   DOB: [birth year], 79 y.o.   MRN: [id number]    Progress Note   Subjective   Day # 3  CC: persistent nausea and vomiting, Takotsubo cardiomyopathy/MI and recent diagnosis of  COVID-19 09/19/2020  Last troponin 2500  WBC 8.7, hgb 13.2  Creat 1.36  LFT's WNL    ABD US-CBD 14 mm, cholelithiasis no cholecystitis    Transient CP this am  while in [place]  BP 86/36 last pm - 120's sys today Patient says she still sore and hurting in the epigastrium, continues to have nausea and vomiting not keeping down any p.o.'s.  She thinks that she may have vomited the Zofran this morning. Also related today that she had a remote vagotomy for ulcer disease in the 1970s.   Objective   Vital signs in last 24 hours: Temp:  [97.8 F (36.6 C)-98.4 F (36.9 C)] 97.8 F (36.6 C) (12/12 0941) Pulse Rate:  [78-91] 78 (12/12 0554) Resp:  [20-22] 22 (12/12 0855) BP: (90-124)/(50-59) 124/59 (12/12 0855) SpO2:  [92 %-98 %] 98 % (12/12 0941) Last BM Date: 09/28/20 General:    Elderly WF in NAD, ill-appearing Heart:  Regular rate and rhythm; no murmurs Lungs: Respirations even and unlabored, lungs CTA bilaterally Abdomen:  Soft, mildly tender in the epigastrium no guarding and nondistended. Normal bowel sounds. Extremities:  Without edema. Neurologic:  Alert and oriented,  grossly normal neurologically. Psych:  Cooperative. Normal mood and affect.  Intake/Output from previous day: No intake/output data recorded. Intake/Output this shift: No intake/output data recorded.  Lab Results: Recent Labs    09/28/20 1831 09/28/20 1846 09/28/20 2027 10/01/20 1004  WBC 6.8  --  7.2 8.7  HGB 15.8* 15.6* 16.0* 13.2  HCT 49.1* 46.0 47.9* 39.3  PLT 257  --  243 226   BMET Recent Labs    09/28/20 1831 09/28/20 1846 09/28/20 2027 10/01/20 1004  NA 139 140  --  135  K 4.5 3.8  --  3.8  CL 100 101  --  98  CO2 23  --   --  27  GLUCOSE 126* 124*  --  102*  BUN 19 17   --  32*  CREATININE 0.90 0.70 0.89 1.36*  CALCIUM 9.0  --   --  8.9   LFT Recent Labs    10/01/20 1004  PROT 5.9*  ALBUMIN 2.9*  AST 34  ALT 17  ALKPHOS 69  BILITOT 0.9   PT/INR Recent Labs    09/28/20 1831  LABPROT 12.3  INR 1.0    Studies/Results: US Abdomen Complete  Result Date: 10/01/2020 CLINICAL DATA:  Nausea and vomiting EXAM: ABDOMEN ULTRASOUND COMPLETE COMPARISON:  None. FINDINGS: Gallbladder: Gallbladder is nondistended however there is a 1 cm calculus within lumen the gallbladder towards the neck. No gallbladder wall thickening. No pericholecystic fluid. Negative sonographic Murphy's sign. Common bile duct: Diameter: Enlarged measuring 7 mm proximally to 14 mm distally. Liver: Echogenic liver lesion measuring 10 mm with posterior shadowing likely a benign calcification. Within normal limits in parenchymal echogenicity. Portal vein is patent on color Doppler imaging with normal direction of blood flow towards the liver. IVC: No abnormality visualized. Pancreas: Visualized portion unremarkable. Spleen: Size and appearance within normal limits. Right Kidney: Length: 9.9 cm.  No hydronephrosis Left Kidney: Length: Is 9.7 cm.  No hydronephrosis Abdominal aorta: No aneurysm visualized. Other findings: No ascites IMPRESSION: 1. Distension of the common bile duct up to 14 mm. 2. Cholelithiasis  without evidence cholecystitis. 3. Consider MRI/MRCP for evaluation of the common bile duct. Electronically Signed   By: Suzy Bouchard M.D.   On: 10/01/2020 11:44   ECHOCARDIOGRAM LIMITED  Result Date: 09/30/2020    ECHOCARDIOGRAM LIMITED REPORT   Patient Name:   DENNIE VECCHIO Sagar Date of Exam: 09/29/2020 Medical Rec #:  [id number]           Height:       66.0 in Accession #:    [id number]          Weight:       142.9 lb Date of Birth:  May 13, 1941           BSA:          1.734 m Patient Age:    64 years            BP:           132/88 mmHg Patient Gender: F                   HR:            88 bpm. Exam Location:  Inpatient Procedure: Limited Echo, Cardiac Doppler, Color Doppler and Intracardiac            Opacification Agent Indications:    Acute myocardial infarction  History:        Patient has no prior history of Echocardiogram examinations.                 Acute MI; Risk Factors:Former Smoker. GERD.  Sonographer:    Mikki Santee RDCS (AE) Sonographer#2:  Clayton Lefort RDCS (AE) Referring Phys: [place]  1. No thrombus (Definity utilized). Left ventricular ejection fraction, by estimation, is 35 to 40%. The left ventricle has moderately decreased function. The left ventricle demonstrates regional wall motion abnormalities (see scoring diagram/findings for description). Left ventricular diastolic parameters are consistent with Grade I diastolic dysfunction (impaired relaxation).  2. The mitral valve is degenerative. Mild mitral valve regurgitation. No evidence of mitral stenosis. Moderate mitral annular calcification.  3. The aortic valve is calcified. Aortic valve regurgitation is mild. Mild aortic valve sclerosis is present, with no evidence of aortic valve stenosis.  4. There is normal pulmonary artery systolic pressure. The estimated right ventricular systolic pressure is 50.2 mmHg.  5. The inferior vena cava is normal in size with greater than 50% respiratory variability, suggesting right atrial pressure of 3 mmHg. FINDINGS  Left Ventricle: No thrombus (Definity utilized). Left ventricular ejection fraction, by estimation, is 35 to 40%. The left ventricle has moderately decreased function. The left ventricle demonstrates regional wall motion abnormalities. Definity contrast  agent was given IV to delineate the left ventricular endocardial borders. Left ventricular diastolic parameters are consistent with Grade I diastolic dysfunction (impaired relaxation).  LV Wall Scoring: The mid and distal anterior septum, mid and distal inferior wall, and mid inferoseptal segment  are akinetic. Right Ventricle: There is normal pulmonary artery systolic pressure. The tricuspid regurgitant velocity is 2.00 m/s, and with an assumed right atrial pressure of 3 mmHg, the estimated right ventricular systolic pressure is 77.4 mmHg. Left Atrium: Left atrial size was normal in size. Right Atrium: Right atrial size was normal in size. Pericardium: There is no evidence of pericardial effusion. Mitral Valve: The mitral valve is degenerative in appearance. There is moderate thickening of the mitral valve leaflet(s). There is moderate calcification of the mitral valve leaflet(s). Moderate mitral annular calcification. Mild mitral valve  regurgitation. No evidence of mitral valve stenosis. Tricuspid Valve: Tricuspid valve regurgitation is trivial. Aortic Valve: The aortic valve is calcified. Aortic valve regurgitation is mild. Mild aortic valve sclerosis is present, with no evidence of aortic valve stenosis. Pulmonic Valve: The pulmonic valve was not well visualized. Pulmonic valve regurgitation is not visualized. Aorta: The aortic root is normal in size and structure. Venous: The inferior vena cava is normal in size with greater than 50% respiratory variability, suggesting right atrial pressure of 3 mmHg. IVC IVC diam: 1.50 cm LEFT ATRIUM             Index LA Vol (A2C):   44.8 ml 25.84 ml/m LA Vol (A4C):   45.0 ml 25.96 ml/m LA Biplane Vol: 47.9 ml 27.63 ml/m  TRICUSPID VALVE TR Peak grad:   16.0 mmHg TR Vmax:        200.00 cm/s Candee Furbish MD Electronically signed by Candee Furbish MD Signature Date/Time: 09/30/2020/7:02:15 AM    Final        Assessment / Plan:    #98 79 year old white female admitted 09/28/2020 with chest pain, found to have anterior lateral MI in setting of Takotsubo cardiomyopathy, nonobstructive coronary disease on cath, EF 35% Now on aspirin and Plavix  Diagnosed with COVID-19 09/19/2020, primary symptoms of weakness, had some confusion nausea and vomiting.  She had completed  COVID-19 vaccination but no booster.  Suspect COVID-19 may have precipitated cardiomyopathy  #2 persistent nausea and vomiting over the past 2 weeks with inability to keep down p.o.'s, associated significant weakness. The persistent nausea and vomiting started around the same time as the diagnosis of COVID-19 and suspect this is COVID-19 related, consider peptic ulcer disease, gastropathy, dysmotility  #3 cholelithiasis with 4 to 40-month history of intermittent epigastric pain with nausea and vomiting episodic and consistent with biliary colic Ultrasound today shows no evidence of Cholecystitis She does have a dilated common bile duct which is new, LFTs normal. Doubt choledocholithiasis but will need eventual further investigation.  #4 anxiety and depression #5 memory loss #6 history of BPPV  #7 mild acute kidney injury secondary to persistent nausea and vomiting  Plan:  Clear liquid diet Change Protonix to 40 mg IV daily Stop Zofran ODT, start Phenergan 12.5 mg IV every 6 hours as needed for nausea. We will consider EGD in next couple days, have been trying to allow her some time to recuperate from acute MI. Also plan for MRCP but would like to hold off until nausea and vomiting is under better control. GI will continue to follow    LOS: 3 days   Amy Esterwood PA-C 10/01/2020, 3:03 PM      Attending Physician Note   I have taken an interval history, reviewed the chart and examined the patient. I agree with the Advanced Practitioner's note, impression and recommendations.   Worsening nausea, vomiting and now dehydration. Resume IVF, change to Phenergan, change Protonix to IV. Change to clear liquids. Abd US shows cholelithiasis, dilated proximal CBD. LFTs normal. MRCP when symptoms have improved. If symptoms do not rapidly improve plan for EGD to further evaluate.  Lucio Edward, MD [facility]

## 2020-10-01 NOTE — Plan of Care (Signed)
?  Problem: Clinical Measurements: ?Goal: Ability to maintain clinical measurements within normal limits will improve ?Outcome: Progressing ?Goal: Will remain free from infection ?Outcome: Progressing ?Goal: Diagnostic test results will improve ?Outcome: Progressing ?  ?

## 2020-10-01 NOTE — Progress Notes (Addendum)
Progress Note  Patient Name: Colleen Glenn Date of Encounter: 10/01/2020  Primary Cardiologist: Sherren Mocha, MD  Subjective   Reports feeling poorly with continued nausea and vomiting this morning. Had transient CP while down getting abdominal ultrasound but that resolved. Denies any SOB.  BP soft yesterday (down to 86/36) - PM dose of carvedilol was initially held then given at bedtime. Isordil held.  Inpatient Medications    Scheduled Meds:  aspirin EC  81 mg Oral Daily   buPROPion  300 mg Oral QAC breakfast   carvedilol  3.125 mg Oral BID WC   cholecalciferol  5,000 Units Oral Daily   clopidogrel  75 mg Oral Daily   donepezil  10 mg Oral QHS   DULoxetine  60 mg Oral Daily   ezetimibe  10 mg Oral Daily   feeding supplement  237 mL Oral TID BM   gabapentin  300 mg Oral QHS   heparin  5,000 Units Subcutaneous Q8H   losartan  25 mg Oral Daily   mirabegron ER  50 mg Oral Daily   multivitamin with minerals  1 tablet Oral Daily   ondansetron  4 mg Oral Q6H   pantoprazole  40 mg Oral Daily   raloxifene  60 mg Oral Daily   rosuvastatin  20 mg Oral Daily   sodium chloride flush  3 mL Intravenous Q12H   Continuous Infusions:  sodium chloride     sodium chloride     PRN Meds: sodium chloride, acetaminophen, cyclobenzaprine, diazepam, ondansetron (ZOFRAN) IV, sodium chloride flush   Vital Signs    Vitals:   09/30/20 1947 09/30/20 2231 10/01/20 0554 10/01/20 0855  BP: (!) 90/50 (!) 111/58  (!) 124/59  Pulse: 90 91 78   Resp:    (!) 22  Temp:   98.4 F (36.9 C)   TempSrc:   Oral   SpO2:   92%   Weight:      Height:       No intake or output data in the 24 hours ending 10/01/20 0934 Last 3 Weights 09/30/2020 09/29/2020 09/28/2020  Weight (lbs) 141 lb 12.8 oz 142 lb 14.4 oz 147 lb 14.9 oz  Weight (kg) 64.32 kg 64.819 kg 67.1 kg     Telemetry    NSR/wandering atrial pacemaker with frequent PACs (some of these also look to be PACs or fusion beats) -  Personally Reviewed  Physical Exam   GEN: No acute distress.  HEENT: Normocephalic, atraumatic, sclera non-icteric. Neck: No JVD or bruits. Cardiac: RRR no murmurs, rubs, or gallops.  Radials/DP/PT 1+ and equal bilaterally.  Respiratory: Clear to auscultation bilaterally. Breathing is unlabored. GI: Soft, nontender, non-distended, BS +x 4. MS: no deformity. Extremities: No clubbing or cyanosis. No edema. Distal pedal pulses are 2+ and equal bilaterally. Right radial cath site with ecchymosis but no hematoma or bruit, good pulse. Neuro:  AAOx3. Follows commands. Psych:  Responds to questions appropriately with a normal affect.  Labs    High Sensitivity Troponin:   Recent Labs  Lab 09/19/20 0539 09/19/20 0802 09/28/20 1831 09/28/20 2027 09/30/20 1448  TROPONINIHS 4 4 4,566* 4,586* 2,517*      Cardiac EnzymesNo results for input(s): TROPONINI in the last 168 hours. No results for input(s): TROPIPOC in the last 168 hours.   Chemistry Recent Labs  Lab 09/28/20 1831 09/28/20 1846 09/28/20 2027  NA 139 140  --   K 4.5 3.8  --   CL 100 101  --  CO2 23  --   --   GLUCOSE 126* 124*  --   BUN 19 17  --   CREATININE 0.90 0.70 0.89  CALCIUM 9.0  --   --   PROT 6.3*  --   --   ALBUMIN 3.3*  --   --   AST 55*  --   --   ALT 16  --   --   ALKPHOS 78  --   --   BILITOT 1.1  --   --   GFRNONAA >60  --  >60  ANIONGAP 16*  --   --      Hematology Recent Labs  Lab 09/28/20 1831 09/28/20 1846 09/28/20 2027  WBC 6.8  --  7.2  RBC 5.21*  --  5.15*  HGB 15.8* 15.6* 16.0*  HCT 49.1* 46.0 47.9*  MCV 94.2  --  93.0  MCH 30.3  --  31.1  MCHC 32.2  --  33.4  RDW 13.6  --  13.8  PLT 257  --  243    BNPNo results for input(s): BNP, PROBNP in the last 168 hours.   DDimer No results for input(s): DDIMER in the last 168 hours.   Radiology    ECHOCARDIOGRAM LIMITED  Result Date: 09/30/2020    ECHOCARDIOGRAM LIMITED REPORT   Patient Name:   JULIONA VALES Mateja Date of  Exam: 09/29/2020 Medical Rec #:  [id number]           Height:       66.0 in Accession #:    [id number]          Weight:       142.9 lb Date of Birth:  May 11, 1941           BSA:          1.734 m Patient Age:    21 years            BP:           132/88 mmHg Patient Gender: F                   HR:           88 bpm. Exam Location:  Inpatient Procedure: Limited Echo, Cardiac Doppler, Color Doppler and Intracardiac            Opacification Agent Indications:    Acute myocardial infarction  History:        Patient has no prior history of Echocardiogram examinations.                 Acute MI; Risk Factors:Former Smoker. GERD.  Sonographer:    Mikki Santee RDCS (AE) Sonographer#2:  Clayton Lefort RDCS (AE) Referring Phys: [place]  1. No thrombus (Definity utilized). Left ventricular ejection fraction, by estimation, is 35 to 40%. The left ventricle has moderately decreased function. The left ventricle demonstrates regional wall motion abnormalities (see scoring diagram/findings for description). Left ventricular diastolic parameters are consistent with Grade I diastolic dysfunction (impaired relaxation).  2. The mitral valve is degenerative. Mild mitral valve regurgitation. No evidence of mitral stenosis. Moderate mitral annular calcification.  3. The aortic valve is calcified. Aortic valve regurgitation is mild. Mild aortic valve sclerosis is present, with no evidence of aortic valve stenosis.  4. There is normal pulmonary artery systolic pressure. The estimated right ventricular systolic pressure is 09.3 mmHg.  5. The inferior vena cava is normal in size with greater than 50% respiratory variability,  suggesting right atrial pressure of 3 mmHg. FINDINGS  Left Ventricle: No thrombus (Definity utilized). Left ventricular ejection fraction, by estimation, is 35 to 40%. The left ventricle has moderately decreased function. The left ventricle demonstrates regional wall motion abnormalities. Definity  contrast  agent was given IV to delineate the left ventricular endocardial borders. Left ventricular diastolic parameters are consistent with Grade I diastolic dysfunction (impaired relaxation).  LV Wall Scoring: The mid and distal anterior septum, mid and distal inferior wall, and mid inferoseptal segment are akinetic. Right Ventricle: There is normal pulmonary artery systolic pressure. The tricuspid regurgitant velocity is 2.00 m/s, and with an assumed right atrial pressure of 3 mmHg, the estimated right ventricular systolic pressure is 27.7 mmHg. Left Atrium: Left atrial size was normal in size. Right Atrium: Right atrial size was normal in size. Pericardium: There is no evidence of pericardial effusion. Mitral Valve: The mitral valve is degenerative in appearance. There is moderate thickening of the mitral valve leaflet(s). There is moderate calcification of the mitral valve leaflet(s). Moderate mitral annular calcification. Mild mitral valve regurgitation. No evidence of mitral valve stenosis. Tricuspid Valve: Tricuspid valve regurgitation is trivial. Aortic Valve: The aortic valve is calcified. Aortic valve regurgitation is mild. Mild aortic valve sclerosis is present, with no evidence of aortic valve stenosis. Pulmonic Valve: The pulmonic valve was not well visualized. Pulmonic valve regurgitation is not visualized. Aorta: The aortic root is normal in size and structure. Venous: The inferior vena cava is normal in size with greater than 50% respiratory variability, suggesting right atrial pressure of 3 mmHg. IVC IVC diam: 1.50 cm LEFT ATRIUM             Index LA Vol (A2C):   44.8 ml 25.84 ml/m LA Vol (A4C):   45.0 ml 25.96 ml/m LA Biplane Vol: 47.9 ml 27.63 ml/m  TRICUSPID VALVE TR Peak grad:   16.0 mmHg TR Vmax:        200.00 cm/s Candee Furbish MD Electronically signed by Candee Furbish MD Signature Date/Time: 09/30/2020/7:02:15 AM    Final     Cardiac Studies    2D Echo 09/29/20  1. No thrombus  (Definity utilized). Left ventricular ejection fraction,  by estimation, is 35 to 40%. The left ventricle has moderately decreased  function. The left ventricle demonstrates regional wall motion  abnormalities (see scoring diagram/findings  for description). Left ventricular diastolic parameters are consistent  with Grade I diastolic dysfunction (impaired relaxation).   2. The mitral valve is degenerative. Mild mitral valve regurgitation. No  evidence of mitral stenosis. Moderate mitral annular calcification.   3. The aortic valve is calcified. Aortic valve regurgitation is mild.  Mild aortic valve sclerosis is present, with no evidence of aortic valve  stenosis.   4. There is normal pulmonary artery systolic pressure. The estimated  right ventricular systolic pressure is 82.4 mmHg.   5. The inferior vena cava is normal in size with greater than 50%  respiratory variability, suggesting right atrial pressure of 3 mmHg.  LHC 09/28/20 1.  Severe segmental LV dysfunction, with classic pattern of acute Takotsubo syndrome (LV apical ballooning) 2.  Nonobstructive coronary artery disease with moderate mid LAD stenosis, mild proximal RCA stenosis, and no evidence of high-grade obstruction 3.  Mildly elevated LVEDP   Recommendations: Medical therapy, supportive care.  Anticipate 48-hour hospitalization as long as no early complications arise.  Repeat echocardiogram in about 3 months to assess for LV recovery.  Treat with a beta-blocker as tolerated.   Patient  Profile     79 y.o. female without prior cardiac history but recent + Covid-19 09/19/20 in the context of AMS/hallucinations felt due to accidental THC intoxication, HTN, HLD, panic disorder, anxiety, BPPV, diverticulosis who presented to [facility] with chest pain and ST elevation. Went to cath lab with cath showing nonobstructive CAD, classic pattern of Takotsubo on LV gram, 2D echo EF 35-40%. Outside of this episode has been having intermittent  abdominal pain, nausea and vomiting for several months with recent US showing stone in the neck of gallbladder, gallbladder sludge and CBG of 40mm. She was pending outpatient gen surg eval for this.  Assessment & Plan    1. Taktosubo cardiomyopathy - cath with nonobstructive CAD, EF 35-40%, medical therapy advised - volume status looks OK - anticipate OP f/u with echo in 3 months after dc - started on Plavix and isordil yesterday by Dr. Johnsie Cancel  - given hypotension yesterday, will hold isordil, decrease carvedilol back to 3.125mg  with hold parameters, and continue losartan at present dose as tolerated - check labs today given low BP yesterday  2. Persistent nausea, vomiting, abdominal pain - appreciate GI input - has had intermittent epigastric pain with N/V for several months consistent with symptomatic cholelithiasis, but also recent persistent symptoms with possible link to recent Covid-19 dx - patient still very symptomatic and feeling poorly today - abdominal US pending today and general surgery to see as well - will ask Dr. Johnsie Cancel to weigh in on cardiac clearance for cholecystectomy if that is what is recommended  3. Essential HTN - follow in context of the above  4. HLD - on simvastatin and Zetia prior to admission, now on Crestor/zetia - needs close f/u of liver function  5. Recent Covid-19 positive on 09/19/20 - out of 10 day isolation window, no classic sx aside from nausea - O2 listed as 92% RA this AM, discussed with nurse who reports repeat value is 98% on RA now - CXR 12/9 without focal airspace disease  6. Frequent PVCs - asymptomatic, continue to follow on BB - consider Op monitor for quantification once recovered - check labs today  For questions or updates, please contact [place] Please consult [email or web address] for contact info under Cardiology/STEMI.  Signed, Charlie Pitter, PA-C 10/01/2020, 9:34 AM    Patient examined chart reviewed. Abdomen non acute US  GB pending Appreciate GI input. Given persistent ST elevation and elevated troponin would not recommend general anesthesia or surgery for at least 2-4 weeks if possible No chest pain On beta blocker for Takatsubo and troponin coming down Not clear if recent COVID exacerbated GB sludge LFTls ok Euvolemic on ARB for depressed EF Consider transfer to medical service in am if GI issues linger Surgical consult pending post [place] arranged by GI  Jenkins Rouge MD [facility]

## 2020-10-01 NOTE — Progress Notes (Signed)
F/u labs reviewed showing evidence of AKI with BUN 32/Cr 1.36. Patient also refusing some meds due to nausea. Nurse will retry with next dose of Zofran on board. D/w Dr. Johnsie Cancel. We will hold further losartan and give gentle IV fluids 80cc/hr x 8 hours. F/u labs in AM.

## 2020-10-01 NOTE — Consult Note (Addendum)
Colleen Glenn 1940/12/24  [id number].    Requesting MD: Dr. Fuller Plan Chief Complaint/Reason for Consult: Persistent n/v w/ presence of cholelithiasis   HPI: Colleen Glenn is a 79 y.o. female with a history of HTN and HLD who we were asked to see for persistent n/v.   Patient notes that over the last 4 months she has been having frequent episodes of epigastric abdominal pain, fullness that is followed by nausea and vomiting after eating.  She normally tries Gas-X for this without any relief.  Her pain usually improves after emesis some subsides after some time.  She underwent work-up as an outpatient was found to have cholelithiasis.  She knows she had an appointment with general surgery but had to cancel this as she recently was diagnosed with Covid on 11/30.  Patient presented on 12/9 with chest pain and emesis.  Code STEMI was called.  Patient was taken to the Cath Lab and found to have nonobstructive CAD, EF 35-40% and tach Subu's cardiomyopathy.  She was started on Plavix yesterday.  During admission she has had persistent nausea and vomiting and not able to tolerate p.o.. She notes over the last 1-2 weeks her nausea/vomiting has become more severe with inability to take any p.o.'s.  GI was consulted and is adjusting nausea medication.  Repeat labs and right a quadrant ultrasound were ordered.  They asked [place] to see.  Patient has history of prior abdominal hysterectomy, appendectomy and vagotomy for ulcer disease ~35 years ago.   ROS: Review of Systems  Constitutional: Positive for malaise/fatigue and weight loss. Negative for chills and fever.  Cardiovascular: Positive for chest pain.  Gastrointestinal: Positive for abdominal pain, nausea and vomiting.  Musculoskeletal: Negative for back pain.  Psychiatric/Behavioral: Positive for substance abuse (she reports accidental).  All other systems reviewed and are negative.   Family History  Problem Relation Age of Onset  . Colon  polyps Mother   . Stroke Mother   . Hypertension Mother   . Congestive Heart Failure Mother   . Stroke Other        several family members on mother's side  . Lung cancer Father 62  . Heart disease Father   . Colon cancer Neg Hx   . Rectal cancer Neg Hx   . Stomach cancer Neg Hx   . Esophageal cancer Neg Hx     Past Medical History:  Diagnosis Date  . Anxiety    on meds  . Arthritis    OA bil shoulders, hands, back  . Back pain   . BPPV (benign paroxysmal positional vertigo) 10/2019  . Bursitis   . Colon polyps    adenomatous  . Colon polyps   . Depression    on meds  . Diverticulosis   . GERD (gastroesophageal reflux disease)    on meds  . Hemorrhoids   . Hyperlipidemia    on meds  . Mild hypertension    white coat syndrome- on meds  . OAB (overactive bladder)   . Osteopenia   . Panic disorder 11/2012  . Plantar fasciitis    hx of  . PUD (peptic ulcer disease)   . Shingles   . Tremor    mild action tremor suspected 2020  . Vitamin D deficiency    on meds    Past Surgical History:  Procedure Laterality Date  . ABDOMINAL HYSTERECTOMY    . APPENDECTOMY    . BACK SURGERY    . COLONOSCOPY  2016   JP-MAC-TA  . FOOT SURGERY     bilateral   . HEMORRHOID SURGERY    . JOINT REPLACEMENT Right 2014  . laproscopic knee surgery Right    2018  . LEFT HEART CATH AND CORONARY ANGIOGRAPHY N/A 09/28/2020   Procedure: LEFT HEART CATH AND CORONARY ANGIOGRAPHY;  Surgeon: Sherren Mocha, MD;  Location: [facility];  Service: Cardiovascular;  Laterality: N/A;  . LUMBAR LAMINECTOMY/DECOMPRESSION MICRODISCECTOMY  05/07/2012   Procedure: LUMBAR LAMINECTOMY/DECOMPRESSION MICRODISCECTOMY 1 LEVEL;  Surgeon: Eustace Moore, MD;  Location: [facility];  Service: Neurosurgery;  Laterality: Right;  Lumbar Laminectomy Decompression Microdiscectomy Lumbar Three-Four  . multiple foot surgeries    . POLYPECTOMY  2016   TA  . REVERSE SHOULDER ARTHROPLASTY Left 07/12/2020    Procedure: REVERSE SHOULDER ARTHROPLASTY;  Surgeon: Hiram Gash, MD;  Location: [facility];  Service: Orthopedics;  Laterality: Left;  . ROTATOR CUFF REPAIR     right side  . TONSILLECTOMY    . TOTAL HIP ARTHROPLASTY  11/16/2012   Procedure: TOTAL HIP ARTHROPLASTY;  Surgeon: Gearlean Alf, MD;  Location: [facility];  Service: Orthopedics;  Laterality: Right;  Right Total Hip Arthroplasty  . TUBAL LIGATION    . VAGOTOMY     approx. 35 years ago, states has a clamp mid epigastric area    Social History:  reports that she quit smoking about 38 years ago. Her smoking use included cigarettes. She has a 20.00 pack-year smoking history. She has never used smokeless tobacco. She reports current alcohol use of about 2.0 - 4.0 standard drinks of alcohol per week. She reports that she does not use drugs. Lives alone. Has family nearby that visits her daily.  Walks with a walker.   Allergies: No Known Allergies  Medications Prior to Admission  Medication Sig Dispense Refill  . acetaminophen (TYLENOL) 325 MG tablet Take 325-650 mg by mouth every 6 (six) hours as needed for mild pain.    Marland Kitchen aspirin EC 81 MG tablet Take 81 mg by mouth at bedtime. Swallow whole.    Marland Kitchen buPROPion (WELLBUTRIN XL) 300 MG 24 hr tablet Take 300 mg by mouth daily before breakfast.     . Cholecalciferol (VITAMIN D-3) 125 MCG (5000 UT) TABS Take 5,000 Units by mouth daily after breakfast.    . donepezil (ARICEPT) 10 MG tablet Take 10 mg by mouth at bedtime.    . DULoxetine (CYMBALTA) 60 MG capsule Take 60 mg by mouth daily.    Marland Kitchen ezetimibe (ZETIA) 10 MG tablet Take 10 mg by mouth daily.    Marland Kitchen gabapentin (NEURONTIN) 300 MG capsule TAKE 1 CAPSULE(300 MG) BY MOUTH AT BEDTIME (Patient taking differently: Take 300 mg by mouth at bedtime.) 30 capsule 3  . MYRBETRIQ 50 MG TB24 tablet Take 50 mg by mouth daily.    . naproxen sodium (ALEVE) 220 MG tablet Take 220-440 mg by mouth 2 (two) times daily as needed (for arthritic  pain).    Marland Kitchen omeprazole (PRILOSEC OTC) 20 MG tablet Take 20 mg by mouth daily before breakfast.    . ondansetron (ZOFRAN-ODT) 8 MG disintegrating tablet Take 8 mg by mouth every 8 (eight) hours as needed for nausea/vomiting (DISSOLVE ORALLY).    Marland Kitchen polyethylene glycol powder (GLYCOLAX/MIRALAX) 17 GM/SCOOP powder Take 17 g by mouth daily.    . raloxifene (EVISTA) 60 MG tablet Take 60 mg by mouth daily.    . simvastatin (ZOCOR) 20 MG tablet Take 20 mg  by mouth every other day.    . triamterene-hydrochlorothiazide (MAXZIDE-25) 37.5-25 MG per tablet Take 1 tablet by mouth daily in the afternoon.     . cyclobenzaprine (FLEXERIL) 5 MG tablet Take 5 mg by mouth 3 (three) times daily as needed for muscle spasms.    . vitamin B-12 (CYANOCOBALAMIN) 1000 MCG tablet Take 1,000 mcg by mouth daily.       Physical Exam: Blood pressure (!) 124/59, pulse 78, temperature 97.8 F (36.6 C), temperature source Oral, resp. rate (!) 22, height _0  (1.676 m), weight 64.3 kg, SpO2 98 %. General: pleasant, WD/WN white female who is laying in bed in NAD HEENT: head is normocephalic, atraumatic.  Sclera are noninjected.  PERRL.  Ears and nose without any masses or lesions.  Mouth is pink and moist. Dentition fair Heart: Regular rate. Irregular rhythm w/ frequent PVCs on monitor.  No obvious murmurs. Palpable pedal pulses bilaterally  Lungs: CTAB, no wheezes, rhonchi, or rales noted.  Respiratory effort nonlabored Abd: Soft, mild epigastric distension with mild epigastric tenderness on deep palpation. Negative Murphy's sign. No peritonitis. +BS, no masses, hernias, or organomegaly. Prior midline ex-lap scar that is well healed.  MS: no BUE/BLE edema, calves soft and nontender Skin: warm and dry with no masses, lesions, or rashes Psych: A&Ox4 with an appropriate affect Neuro: cranial nerves grossly intact, equal strength in BUE/BLE bilaterally, normal speech, thought process intact  Results for orders placed or performed  during the hospital encounter of 09/28/20 (from the past 48 hour(s))  Troponin I (High Sensitivity)     Status: Abnormal   Collection Time: 09/30/20  2:48 PM  Result Value Ref Range   Troponin I (High Sensitivity) 2,517 (HH) <18 ng/L    Comment: CRITICAL VALUE NOTED.  VALUE IS CONSISTENT WITH PREVIOUSLY REPORTED AND CALLED VALUE. (NOTE) Elevated high sensitivity troponin I (hsTnI) values and significant  changes across serial measurements may suggest ACS but many other  chronic and acute conditions are known to elevate hsTnI results.  Refer to the Links section for chest pain algorithms and additional  guidance. Performed at [facility], [place] [address], [place], [place] [address]   TSH     Status: None   Collection Time: 10/01/20 10:04 AM  Result Value Ref Range   TSH 3.148 0.350 - 4.500 uIU/mL    Comment: Performed by a 3rd Generation assay with a functional sensitivity of <=0.01 uIU/mL. Performed at [facility], [place] [address] [place] [address]   CBC     Status: None   Collection Time: 10/01/20 10:04 AM  Result Value Ref Range   WBC 8.7 4.0 - 10.5 K/uL   RBC 4.23 3.87 - 5.11 MIL/uL   Hemoglobin 13.2 12.0 - 15.0 g/dL   HCT 39.3 36.0 - 46.0 %   MCV 92.9 80.0 - 100.0 fL   MCH 31.2 26.0 - 34.0 pg   MCHC 33.6 30.0 - 36.0 g/dL   RDW 13.6 11.5 - 15.5 %   Platelets 226 150 - 400 K/uL   nRBC 0.0 0.0 - 0.2 %    Comment: Performed at [facility], [place] [address], [place],  [address]  Comprehensive metabolic panel     Status: Abnormal   Collection Time: 10/01/20 10:04 AM  Result Value Ref Range   Sodium 135 135 - 145 mmol/L   Potassium 3.8 3.5 - 5.1 mmol/L   Chloride 98 98 - 111 mmol/L   CO2 27 22 -  32 mmol/L   Glucose, Bld 102 (H) 70 - 99 mg/dL    Comment: Glucose reference range applies only to samples taken after fasting for at least 8 hours.   BUN 32 (H) 8 - 23 mg/dL   Creatinine, Ser 1.36 (H) 0.44 - 1.00 mg/dL   Calcium 8.9 8.9 - 10.3  mg/dL   Total Protein 5.9 (L) 6.5 - 8.1 g/dL   Albumin 2.9 (L) 3.5 - 5.0 g/dL   AST 34 15 - 41 U/L   ALT 17 0 - 44 U/L   Alkaline Phosphatase 69 38 - 126 U/L   Total Bilirubin 0.9 0.3 - 1.2 mg/dL   GFR, Estimated 40 (L) >60 mL/min    Comment: (NOTE) Calculated using the CKD-EPI Creatinine Equation (2021)    Anion gap 10 5 - 15    Comment: Performed at [facility], [place] [address] [place], [place] [address]  Magnesium     Status: None   Collection Time: 10/01/20 10:04 AM  Result Value Ref Range   Magnesium 2.1 1.7 - 2.4 mg/dL    Comment: Performed at [facility] [address], [place], [place] [address]   ECHOCARDIOGRAM LIMITED  Result Date: 09/30/2020    ECHOCARDIOGRAM LIMITED REPORT   Patient Name:   BLEN RANSOME Mcmeen Date of Exam: 09/29/2020 Medical Rec #:  [id number]           Height:       66.0 in Accession #:    [id number]          Weight:       142.9 lb Date of Birth:  1941-07-04           BSA:          1.734 m Patient Age:    2 years            BP:           132/88 mmHg Patient Gender: F                   HR:           88 bpm. Exam Location:  Inpatient Procedure: Limited Echo, Cardiac Doppler, Color Doppler and Intracardiac            Opacification Agent Indications:    Acute myocardial infarction  History:        Patient has no prior history of Echocardiogram examinations.                 Acute MI; Risk Factors:Former Smoker. GERD.  Sonographer:    Mikki Santee RDCS (AE) Sonographer#2:  Clayton Lefort RDCS (AE) Referring Phys: [place]  1. No thrombus (Definity utilized). Left ventricular ejection fraction, by estimation, is 35 to 40%. The left ventricle has moderately decreased function. The left ventricle demonstrates regional wall motion abnormalities (see scoring diagram/findings for description). Left ventricular diastolic parameters are consistent with Grade I diastolic dysfunction (impaired relaxation).  2. The mitral valve is  degenerative. Mild mitral valve regurgitation. No evidence of mitral stenosis. Moderate mitral annular calcification.  3. The aortic valve is calcified. Aortic valve regurgitation is mild. Mild aortic valve sclerosis is present, with no evidence of aortic valve stenosis.  4. There is normal pulmonary artery systolic pressure. The estimated right ventricular systolic pressure is 76.1 mmHg.  5. The inferior vena cava is normal in size with greater than 50% respiratory variability, suggesting right atrial pressure of 3 mmHg. FINDINGS  Left Ventricle: No thrombus (Definity utilized). Left ventricular ejection fraction, by estimation, is 35 to 40%. The left ventricle has moderately decreased function. The left ventricle demonstrates regional wall motion abnormalities. Definity contrast  agent was given IV to delineate the left ventricular endocardial borders. Left ventricular diastolic parameters are consistent with Grade I diastolic dysfunction (impaired relaxation).  LV Wall Scoring: The mid and distal anterior septum, mid and distal inferior wall, and mid inferoseptal segment are akinetic. Right Ventricle: There is normal pulmonary artery systolic pressure. The tricuspid regurgitant velocity is 2.00 m/s, and with an assumed right atrial pressure of 3 mmHg, the estimated right ventricular systolic pressure is 44.0 mmHg. Left Atrium: Left atrial size was normal in size. Right Atrium: Right atrial size was normal in size. Pericardium: There is no evidence of pericardial effusion. Mitral Valve: The mitral valve is degenerative in appearance. There is moderate thickening of the mitral valve leaflet(s). There is moderate calcification of the mitral valve leaflet(s). Moderate mitral annular calcification. Mild mitral valve regurgitation. No evidence of mitral valve stenosis. Tricuspid Valve: Tricuspid valve regurgitation is trivial. Aortic Valve: The aortic valve is calcified. Aortic valve regurgitation is mild. Mild aortic  valve sclerosis is present, with no evidence of aortic valve stenosis. Pulmonic Valve: The pulmonic valve was not well visualized. Pulmonic valve regurgitation is not visualized. Aorta: The aortic root is normal in size and structure. Venous: The inferior vena cava is normal in size with greater than 50% respiratory variability, suggesting right atrial pressure of 3 mmHg. IVC IVC diam: 1.50 cm LEFT ATRIUM             Index LA Vol (A2C):   44.8 ml 25.84 ml/m LA Vol (A4C):   45.0 ml 25.96 ml/m LA Biplane Vol: 47.9 ml 27.63 ml/m  TRICUSPID VALVE TR Peak grad:   16.0 mmHg TR Vmax:        200.00 cm/s Candee Furbish MD Electronically signed by Candee Furbish MD Signature Date/Time: 09/30/2020/7:02:15 AM    Final    Anti-infectives (From admission, onward)   None       Assessment/Plan HTN HLD Taktosubo cardiomyopathy Recent Covid-19 positive on 09/19/20 Recent admission 11/30 for AMS thought to be 2/2 TIA vs THC use  Persistent nausea and vomiting Epigastric abdominal pain/bloating Cholelithiasis  This is a 79 y.o. female with epigastric abdominal pain, epigastric fullness/bloating, nausea and vomiting that has occurred over the last 4 months prior to admission. It sounds like her nausea/vomiting has worsened over the last 1-2 weeks to where she is not tolerating any p.o. intake. We were asked to see for possible symptomatic cholelithiasis and to consider Lap Chole. Currently WBC and LFT's are wnl. An ultrasound was done this morning and pending. Will follow up on this to ensure there is no Cholecystitis, however with her being afebrile, normal wbc, normal lfts and only minimally tender on exam, my suspicion is low. Although patient does have a prior history of cholelithiasis, and the symptoms could be related to biliary colic, she also has a history of a prior ex lap and vagotomy for ulcerative disease ~35 years ago. I think it be reasonable to start with a EGD for further work-up. However, in reviewing  cards note, they are recommending no general anesthesia/surgery for at least 2 to 4 weeks if possible as she was recently admitted for STEMI and found to have nonobstructive CAD with EF 35-40% and Takotsubo's cardiomyopathy on cath. She was started on Plavix yesterday. Hopefully patients abdominal pain &  n/v can be symptomatically management during admission and undergo further work-up/treatment as noted above as an outpatient. Will follow up on [place]. We will follow with you.   FEN - Currently on HH diet. Could back down given on going n/v.  VTE - SCDs, Plavix, Heparin subq ID - None  Jillyn Ledger, [facility] Surgery 10/01/2020, 11:22 AM Please see Amion for pager number during day hours 7:00am-4:30pm

## 2020-10-02 DIAGNOSIS — K8020 Calculus of gallbladder without cholecystitis without obstruction: Secondary | ICD-10-CM

## 2020-10-02 DIAGNOSIS — R112 Nausea with vomiting, unspecified: Secondary | ICD-10-CM

## 2020-10-02 LAB — BASIC METABOLIC PANEL
Anion gap: 13 (ref 5–15)
BUN: 28 mg/dL — ABNORMAL HIGH (ref 8–23)
CO2: 24 mmol/L (ref 22–32)
Calcium: 8.4 mg/dL — ABNORMAL LOW (ref 8.9–10.3)
Chloride: 100 mmol/L (ref 98–111)
Creatinine, Ser: 1.18 mg/dL — ABNORMAL HIGH (ref 0.44–1.00)
GFR, Estimated: 47 mL/min — ABNORMAL LOW (ref >60)
Glucose, Bld: 73 mg/dL (ref 70–99)
Potassium: 3.5 mmol/L (ref 3.5–5.1)
Sodium: 137 mmol/L (ref 135–145)

## 2020-10-02 LAB — CBC
HCT: 37.7 % (ref 36.0–46.0)
Hemoglobin: 12.5 g/dL (ref 12.0–15.0)
MCH: 30.9 pg (ref 26.0–34.0)
MCHC: 33.2 g/dL (ref 30.0–36.0)
MCV: 93.1 fL (ref 80.0–100.0)
Platelets: 221 10*3/uL (ref 150–400)
RBC: 4.05 MIL/uL (ref 3.87–5.11)
RDW: 13.6 % (ref 11.5–15.5)
WBC: 6.6 10*3/uL (ref 4.0–10.5)
nRBC: 0 % (ref 0.0–0.2)

## 2020-10-02 LAB — MAGNESIUM: Magnesium: 2.1 mg/dL (ref 1.7–2.4)

## 2020-10-02 MED ORDER — LOSARTAN POTASSIUM 25 MG PO TABS
12.5000 mg | ORAL_TABLET | Freq: Every day | ORAL | Status: DC
  Administered 2020-10-02 – 2020-10-03 (×2): 12.5 mg via ORAL
  Filled 2020-10-02 (×2): qty 1

## 2020-10-02 MED ORDER — CARVEDILOL 6.25 MG PO TABS
6.2500 mg | ORAL_TABLET | Freq: Two times a day (BID) | ORAL | Status: DC
  Administered 2020-10-02 – 2020-10-03 (×3): 6.25 mg via ORAL
  Filled 2020-10-02 (×3): qty 1

## 2020-10-02 MED ORDER — PANTOPRAZOLE SODIUM 40 MG PO TBEC
40.0000 mg | DELAYED_RELEASE_TABLET | Freq: Every day | ORAL | Status: DC
  Administered 2020-10-03: 07:00:00 40 mg via ORAL
  Filled 2020-10-02: qty 1

## 2020-10-02 MED ORDER — POTASSIUM CHLORIDE CRYS ER 20 MEQ PO TBCR
20.0000 meq | EXTENDED_RELEASE_TABLET | Freq: Once | ORAL | Status: AC
  Administered 2020-10-02: 11:00:00 20 meq via ORAL
  Filled 2020-10-02: qty 1

## 2020-10-02 NOTE — Progress Notes (Signed)
[facility] Progress Note  4 Days Post-Op  Subjective: CC-  Feels the same as yesterday. Continues to complain of epigastric abdominal pain and nausea. No recent emesis over night or this AM. Able to tolerate jello and broth for dinner last night.   Objective: Vital signs in last 24 hours: Temp:  [97.6 F (36.4 C)-98.2 F (36.8 C)] 97.6 F (36.4 C) (12/13 0633) Pulse Rate:  [68-90] 83 (12/13 0048) Resp:  [18] 18 (12/12 1559) BP: (125-137)/(61-73) 137/73 (12/13 0633) SpO2:  [93 %-98 %] 94 % (12/12 1948) Last BM Date: 09/28/20  Intake/Output from previous day: No intake/output data recorded. Intake/Output this shift: No intake/output data recorded.  PE: Gen:  Alert, NAD, pleasant HEENT: EOM's intact, pupils equal and round Pulm:  CTAB, no W/R/R, rate and effort normal Abd: Soft, mild epigastric distension with mild epigastric tenderness on deep palpation. Negative Murphy's sign. No peritonitis. +BS, no masses, hernias, or organomegaly. Prior midline ex-lap scar that is well healed Ext:  no BUE/BLE edema, calves soft and nontender Psych: A&Ox4  Skin: no rashes noted, warm and dry  Lab Results:  Recent Labs    10/01/20 1004 10/02/20 0623  WBC 8.7 6.6  HGB 13.2 12.5  HCT 39.3 37.7  PLT 226 221   BMET Recent Labs    10/01/20 1004 10/02/20 0623  NA 135 137  K 3.8 3.5  CL 98 100  CO2 27 24  GLUCOSE 102* 73  BUN 32* 28*  CREATININE 1.36* 1.18*  CALCIUM 8.9 8.4*   PT/INR No results for input(s): LABPROT, INR in the last 72 hours. CMP     Component Value Date/Time   NA 137 10/02/2020 0623   K 3.5 10/02/2020 0623   CL 100 10/02/2020 0623   CO2 24 10/02/2020 0623   GLUCOSE 73 10/02/2020 0623   BUN 28 (H) 10/02/2020 0623   CREATININE 1.18 (H) 10/02/2020 0623   CALCIUM 8.4 (L) 10/02/2020 0623   PROT 5.9 (L) 10/01/2020 1004   ALBUMIN 2.9 (L) 10/01/2020 1004   AST 34 10/01/2020 1004   ALT 17 10/01/2020 1004   ALKPHOS 69 10/01/2020 1004   BILITOT  0.9 10/01/2020 1004   GFRNONAA 47 (L) 10/02/2020 0623   GFRAA >60 07/10/2020 1430   Lipase     Component Value Date/Time   LIPASE 24 10/01/2020 1702       Studies/Results: US Abdomen Complete  Result Date: 10/01/2020 CLINICAL DATA:  Nausea and vomiting EXAM: ABDOMEN ULTRASOUND COMPLETE COMPARISON:  None. FINDINGS: Gallbladder: Gallbladder is nondistended however there is a 1 cm calculus within lumen the gallbladder towards the neck. No gallbladder wall thickening. No pericholecystic fluid. Negative sonographic Murphy's sign. Common bile duct: Diameter: Enlarged measuring 7 mm proximally to 14 mm distally. Liver: Echogenic liver lesion measuring 10 mm with posterior shadowing likely a benign calcification. Within normal limits in parenchymal echogenicity. Portal vein is patent on color Doppler imaging with normal direction of blood flow towards the liver. IVC: No abnormality visualized. Pancreas: Visualized portion unremarkable. Spleen: Size and appearance within normal limits. Right Kidney: Length: 9.9 cm.  No hydronephrosis Left Kidney: Length: Is 9.7 cm.  No hydronephrosis Abdominal aorta: No aneurysm visualized. Other findings: No ascites IMPRESSION: 1. Distension of the common bile duct up to 14 mm. 2. Cholelithiasis without evidence cholecystitis. 3. Consider MRI/MRCP for evaluation of the common bile duct. Electronically Signed   By: Suzy Bouchard M.D.   On: 10/01/2020 11:44    Anti-infectives: Anti-infectives (From admission, onward)  None       Assessment/Plan HTN HLD Taktosubo cardiomyopathy Nonobstructive CAD with EF 35-40% Recent Covid-19 positive on 09/19/20 Recent admission 11/30 for AMS thought to be 2/2 TIA vs THC use Hx prior ex lap and vagotomy for ulcerative disease ~35 years ago  Persistent nausea and vomiting Epigastric abdominal pain/bloating Cholelithiasis  - u/s shows cholelithiasis, no signs of acute cholecystitis, WBC WNL, afebrile, LFTs WNL  (12/12) - main complaint is n/v, symptoms not fully consistent with biliary colic and would not recommend cholecystectomy at this time given other comorbidities. Could consider starting ursodiol if compatible with her other medications to see if this helps - GI following for further work up - No indication for acute surgical intervention. We will sign off, please call with concerns.  FEN - CLD VTE - SCDs, Plavix, Heparin subq ID - None   LOS: 4 days    Wellington Hampshire, [facility] Surgery 10/02/2020, 9:23 AM Please see Amion for pager number during day hours 7:00am-4:30pm

## 2020-10-02 NOTE — Progress Notes (Addendum)
Daily Rounding Note  10/02/2020, 2:28 PM  LOS: 4 days   SUBJECTIVE:   Chief complaint:      epig pain resolved.  Nearly 24 h w/o nausea, tolerating clears.  Last BM loose, brown on 12/12.  Feels tired   OBJECTIVE:         Vital signs in last 24 hours:    Temp:  [97.6 F (36.4 C)-98.2 F (36.8 C)] 97.6 F (36.4 C) (12/13 0633) Pulse Rate:  [68-90] 87 (12/13 1057) Resp:  [18] 18 (12/12 1559) BP: (125-140)/(61-74) 140/74 (12/13 1057) SpO2:  [93 %-94 %] 94 % (12/12 1948) Last BM Date: 09/28/20 Filed Weights   09/28/20 1952 09/29/20 0544 09/30/20 0556  Weight: 67.1 kg 64.8 kg 64.3 kg   General: tired but not ill looking   Heart: RRR Chest: clear bil  Abdomen: soft, NT, ND.  Obese.  BS hypoactive  Extremities: no CCE Neuro/Psych: Appropriate.  Alert and oriented x3.  No tremors.  Intake/Output from previous day: No intake/output data recorded.  Intake/Output this shift: No intake/output data recorded.  Lab Results: Recent Labs    10/01/20 1004 10/02/20 0623  WBC 8.7 6.6  HGB 13.2 12.5  HCT 39.3 37.7  PLT 226 221   BMET Recent Labs    10/01/20 1004 10/02/20 0623  NA 135 137  K 3.8 3.5  CL 98 100  CO2 27 24  GLUCOSE 102* 73  BUN 32* 28*  CREATININE 1.36* 1.18*  CALCIUM 8.9 8.4*   LFT Recent Labs    10/01/20 1004  PROT 5.9*  ALBUMIN 2.9*  AST 34  ALT 17  ALKPHOS 69  BILITOT 0.9   PT/INR No results for input(s): LABPROT, INR in the last 72 hours. Hepatitis Panel No results for input(s): HEPBSAG, HCVAB, HEPAIGM, HEPBIGM in the last 72 hours.  Studies/Results: US Abdomen Complete  Result Date: 10/01/2020 CLINICAL DATA:  Nausea and vomiting EXAM: ABDOMEN ULTRASOUND COMPLETE COMPARISON:  None. FINDINGS: Gallbladder: Gallbladder is nondistended however there is a 1 cm calculus within lumen the gallbladder towards the neck. No gallbladder wall thickening. No pericholecystic fluid.  Negative sonographic Murphy's sign. Common bile duct: Diameter: Enlarged measuring 7 mm proximally to 14 mm distally. Liver: Echogenic liver lesion measuring 10 mm with posterior shadowing likely a benign calcification. Within normal limits in parenchymal echogenicity. Portal vein is patent on color Doppler imaging with normal direction of blood flow towards the liver. IVC: No abnormality visualized. Pancreas: Visualized portion unremarkable. Spleen: Size and appearance within normal limits. Right Kidney: Length: 9.9 cm.  No hydronephrosis Left Kidney: Length: Is 9.7 cm.  No hydronephrosis Abdominal aorta: No aneurysm visualized. Other findings: No ascites IMPRESSION: 1. Distension of the common bile duct up to 14 mm. 2. Cholelithiasis without evidence cholecystitis. 3. Consider MRI/MRCP for evaluation of the common bile duct. Electronically Signed   By: Suzy Bouchard M.D.   On: 10/01/2020 11:44   Scheduled Meds:  aspirin EC  81 mg Oral Daily   buPROPion  300 mg Oral QAC breakfast   carvedilol  6.25 mg Oral BID WC   cholecalciferol  5,000 Units Oral Daily   donepezil  10 mg Oral QHS   DULoxetine  60 mg Oral Daily   ezetimibe  10 mg Oral Daily   feeding supplement  237 mL Oral TID BM   gabapentin  300 mg Oral QHS   heparin  5,000 Units Subcutaneous Q8H   losartan  12.5 mg  Oral Daily   mirabegron ER  50 mg Oral Daily   multivitamin with minerals  1 tablet Oral Daily   [START ON 10/03/2020] pantoprazole  40 mg Oral Q0600   raloxifene  60 mg Oral Daily   rosuvastatin  20 mg Oral Daily   sodium chloride flush  3 mL Intravenous Q12H   Continuous Infusions:  sodium chloride     sodium chloride     PRN Meds:.sodium chloride, acetaminophen, cyclobenzaprine, diazepam, promethazine, sodium chloride flush  ASSESMENT:   *    Nausea, vomiting, epigastric pain, present for 4 months but recently worse. History ex lap, vagotomy for ulcers ~ 35 years ago. LFTs briefly mildly  elevated 12/9 but now normal. Gallbladder sludge, stone in the neck of gallbladder per ultrasound, no cholecystitis per ultrasound. Surgery not recommending cholecystectomy or percutaneous biliary drain.  Suggesting treatment with ursodiol.  Surgery has signed off. Tums improved.  *    STEMI. Anterolateral MI secondary to Takotsubo's cardiomyopathy.  Nonobstructive CAD.  EF 35 to 40%. Plavix initiated 12/11.  *    COVID-19 +11/30.   PLAN   *    Advance to a heart healthy diet.  *    Switch to oral Protonix 40 mg/daily.    Azucena Freed  10/02/2020, 2:28 PM Phone [phone]   Attending physician's note   I have taken a history, examined the patient and reviewed the chart. I agree with the Advanced Practitioner's note, impression and recommendations.  79 year old female with anterior lateral MI, Takotsubo cardiomyopathy, COVID-19 infection November 2021 Nausea and vomiting improving Continue Protonix 40 mg daily and antireflux measures Slowly advance diet as tolerated  No evidence of cholecystitis or choledocholithiasis.  She has stone in the gallbladder neck, is likely etiology for symptoms.  Surgery recommended conservative management at this point  We will hold off endoscopic evaluation given her symptoms have significantly improved  GI will sign off, available if have any questions or concerns   I have spent 35 minutes of patient care (this includes precharting, chart review, review of results, face-to-face time used for counseling as well as treatment plan and follow-up. The patient was provided an opportunity to ask questions and all were answered. The patient agreed with the plan and demonstrated an understanding of the instructions.  Damaris Hippo , MD [phone]

## 2020-10-02 NOTE — Progress Notes (Signed)
Progress Note  Patient Name: Colleen Glenn Date of Encounter: 10/02/2020  [facility] Cardiologist: Sherren Mocha, MD   Subjective   Some nausea this morning, but no vomiting. No chest pain.   Inpatient Medications    Scheduled Meds:  aspirin EC  81 mg Oral Daily   buPROPion  300 mg Oral QAC breakfast   carvedilol  3.125 mg Oral BID WC   cholecalciferol  5,000 Units Oral Daily   clopidogrel  75 mg Oral Daily   donepezil  10 mg Oral QHS   DULoxetine  60 mg Oral Daily   ezetimibe  10 mg Oral Daily   feeding supplement  237 mL Oral TID BM   gabapentin  300 mg Oral QHS   heparin  5,000 Units Subcutaneous Q8H   mirabegron ER  50 mg Oral Daily   multivitamin with minerals  1 tablet Oral Daily   pantoprazole (PROTONIX) IV  40 mg Intravenous Q24H   raloxifene  60 mg Oral Daily   rosuvastatin  20 mg Oral Daily   sodium chloride flush  3 mL Intravenous Q12H   Continuous Infusions:  sodium chloride     sodium chloride     PRN Meds: sodium chloride, acetaminophen, cyclobenzaprine, diazepam, promethazine, sodium chloride flush   Vital Signs    Vitals:   10/01/20 1559 10/01/20 1948 10/02/20 0048 10/02/20 0633  BP: 132/65 125/61  137/73  Pulse: 68 90 83   Resp: 18     Temp: 97.8 F (36.6 C) 98.2 F (36.8 C)  97.6 F (36.4 C)  TempSrc: Oral Oral  Oral  SpO2: 93% 94%    Weight:      Height:       No intake or output data in the 24 hours ending 10/02/20 0913 Last 3 Weights 09/30/2020 09/29/2020 09/28/2020  Weight (lbs) 141 lb 12.8 oz 142 lb 14.4 oz 147 lb 14.9 oz  Weight (kg) 64.32 kg 64.819 kg 67.1 kg      Telemetry    ST-->PACs, NSVT yesterday - Personally Reviewed  ECG    No new tracing this morning  Physical Exam  Pleasant older female, laying in bed GEN: No acute distress.   Neck: No JVD Cardiac: RRR, no murmurs, rubs, or gallops.  Respiratory: Clear to auscultation bilaterally. GI: Soft, nontender, non-distended  MS: No  edema; No deformity. Neuro:  Nonfocal  Psych: Normal affect   Labs    High Sensitivity Troponin:   Recent Labs  Lab 09/19/20 0539 09/19/20 0802 09/28/20 1831 09/28/20 2027 09/30/20 1448  TROPONINIHS 4 4 4,566* 4,586* 2,517*      Chemistry Recent Labs  Lab 09/28/20 1831 09/28/20 1846 09/28/20 2027 10/01/20 1004 10/02/20 0623  NA 139 140  --  135 137  K 4.5 3.8  --  3.8 3.5  CL 100 101  --  98 100  CO2 23  --   --  27 24  GLUCOSE 126* 124*  --  102* 73  BUN 19 17  --  32* 28*  CREATININE 0.90 0.70 0.89 1.36* 1.18*  CALCIUM 9.0  --   --  8.9 8.4*  PROT 6.3*  --   --  5.9*  --   ALBUMIN 3.3*  --   --  2.9*  --   AST 55*  --   --  34  --   ALT 16  --   --  17  --   ALKPHOS 78  --   --  69  --   BILITOT 1.1  --   --  0.9  --   GFRNONAA >60  --  >60 40* 47*  ANIONGAP 16*  --   --  10 13     Hematology Recent Labs  Lab 09/28/20 2027 10/01/20 1004 10/02/20 0623  WBC 7.2 8.7 6.6  RBC 5.15* 4.23 4.05  HGB 16.0* 13.2 12.5  HCT 47.9* 39.3 37.7  MCV 93.0 92.9 93.1  MCH 31.1 31.2 30.9  MCHC 33.4 33.6 33.2  RDW 13.8 13.6 13.6  PLT 243 226 221    BNPNo results for input(s): BNP, PROBNP in the last 168 hours.   DDimer No results for input(s): DDIMER in the last 168 hours.   Radiology    US Abdomen Complete  Result Date: 10/01/2020 CLINICAL DATA:  Nausea and vomiting EXAM: ABDOMEN ULTRASOUND COMPLETE COMPARISON:  None. FINDINGS: Gallbladder: Gallbladder is nondistended however there is a 1 cm calculus within lumen the gallbladder towards the neck. No gallbladder wall thickening. No pericholecystic fluid. Negative sonographic Murphy's sign. Common bile duct: Diameter: Enlarged measuring 7 mm proximally to 14 mm distally. Liver: Echogenic liver lesion measuring 10 mm with posterior shadowing likely a benign calcification. Within normal limits in parenchymal echogenicity. Portal vein is patent on color Doppler imaging with normal direction of blood flow towards the  liver. IVC: No abnormality visualized. Pancreas: Visualized portion unremarkable. Spleen: Size and appearance within normal limits. Right Kidney: Length: 9.9 cm.  No hydronephrosis Left Kidney: Length: Is 9.7 cm.  No hydronephrosis Abdominal aorta: No aneurysm visualized. Other findings: No ascites IMPRESSION: 1. Distension of the common bile duct up to 14 mm. 2. Cholelithiasis without evidence cholecystitis. 3. Consider MRI/MRCP for evaluation of the common bile duct. Electronically Signed   By: Suzy Bouchard M.D.   On: 10/01/2020 11:44    Cardiac Studies   Cath: 09/28/20  1. Severe segmental LV dysfunction, with classic pattern of acute Takotsubo syndrome (LV apical ballooning) 2. Nonobstructive coronary artery disease with moderate mid LAD stenosis, mild proximal RCA stenosis, and no evidence of high-grade obstruction 3. Mildly elevated LVEDP  Recommendations: Medical therapy, supportive care. Anticipate 48-hour hospitalization as long as no early complications arise. Repeat echocardiogram in about 3 months to assess for LV recovery. Treat with a beta-blocker as tolerated. Diagnostic Dominance: Right    Echo: 09/29/20  IMPRESSIONS    1. No thrombus (Definity utilized). Left ventricular ejection fraction,  by estimation, is 35 to 40%. The left ventricle has moderately decreased  function. The left ventricle demonstrates regional wall motion  abnormalities (see scoring diagram/findings  for description). Left ventricular diastolic parameters are consistent  with Grade I diastolic dysfunction (impaired relaxation).  2. The mitral valve is degenerative. Mild mitral valve regurgitation. No  evidence of mitral stenosis. Moderate mitral annular calcification.  3. The aortic valve is calcified. Aortic valve regurgitation is mild.  Mild aortic valve sclerosis is present, with no evidence of aortic valve  stenosis.  4. There is normal pulmonary artery systolic pressure. The  estimated  right ventricular systolic pressure is 52.7 mmHg.  5. The inferior vena cava is normal in size with greater than 50%  respiratory variability, suggesting right atrial pressure of 3 mmHg.   FINDINGS  Left Ventricle: No thrombus (Definity utilized). Left ventricular  ejection fraction, by estimation, is 35 to 40%. The left ventricle has  moderately decreased function. The left ventricle demonstrates regional  wall motion abnormalities. Definity contrast  agent was given IV to delineate  the left ventricular endocardial borders.  Left ventricular diastolic parameters are consistent with Grade I  diastolic dysfunction (impaired relaxation).     LV Wall Scoring:  The mid and distal anterior septum, mid and distal inferior wall, and mid  inferoseptal segment are akinetic.   Right Ventricle: There is normal pulmonary artery systolic pressure. The  tricuspid regurgitant velocity is 2.00 m/s, and with an assumed right  atrial pressure of 3 mmHg, the estimated right ventricular systolic  pressure is 09.2 mmHg.   Left Atrium: Left atrial size was normal in size.   Right Atrium: Right atrial size was normal in size.   Pericardium: There is no evidence of pericardial effusion.   Mitral Valve: The mitral valve is degenerative in appearance. There is  moderate thickening of the mitral valve leaflet(s). There is moderate  calcification of the mitral valve leaflet(s). Moderate mitral annular  calcification. Mild mitral valve  regurgitation. No evidence of mitral valve stenosis.   Tricuspid Valve: Tricuspid valve regurgitation is trivial.   Aortic Valve: The aortic valve is calcified. Aortic valve regurgitation is  mild. Mild aortic valve sclerosis is present, with no evidence of aortic  valve stenosis.   Pulmonic Valve: The pulmonic valve was not well visualized. Pulmonic valve  regurgitation is not visualized.   Aorta: The aortic root is normal in size and structure.    Venous: The inferior vena cava is normal in size with greater than 50%  respiratory variability, suggesting right atrial pressure of 3 mmHg.   Patient Profile     79 y.o. female with PMH of HTN, HLD, GERD, depression who presented with chest pain and STEMI called in the ED given ST elevation in anterolateral leads.   Assessment & Plan    1. NSTEMI with Taktosubo cardiomyopathy: underwent cardiac cath with Dr. Burt Knack noted above with mild nonobstructive coronary disease. Noted to have severe segmental LV dysfunction with appearance of Takotsubo. Followed up echo with 35-40%,  mid and distal anterior septum, mid and distal inferior wall, and mid inferoseptal segment are akinetic.  -- continue Coreg 3.125mg  BID, will further increase to 6.25mg  BID today as blood pressures are stable. Will attempt to resume losartan 12.5mg  daily and follow BPs  2. HTN: has been on Maxzide prior to admission, which is held. Continue coreg as above with addition of losartan.   3. HLD: LDL 118, switched statin to Crestor 20mg  and continue Zetia  4. COVID: tested positive on 11/30 during a work up in the ED for TIA which was negative. She has been asymptomatic. Now off precautions  5. Persistent n/v with cholelithiasis: seen by GI with adjustments made to help control n/v. She has not had any vomiting today. Also plan for MRCP if able to get n/v under control.  -- appreciate assistance  For questions or updates, please contact [facility] Please consult [email or web address] for contact info under        Signed, Reino Bellis, NP  10/02/2020, 9:13 AM

## 2020-10-03 ENCOUNTER — Other Ambulatory Visit (HOSPITAL_COMMUNITY): Payer: Self-pay | Admitting: Cardiology

## 2020-10-03 LAB — BASIC METABOLIC PANEL
Anion gap: 9 (ref 5–15)
BUN: 21 mg/dL (ref 8–23)
CO2: 28 mmol/L (ref 22–32)
Calcium: 8.8 mg/dL — ABNORMAL LOW (ref 8.9–10.3)
Chloride: 100 mmol/L (ref 98–111)
Creatinine, Ser: 1.01 mg/dL — ABNORMAL HIGH (ref 0.44–1.00)
GFR, Estimated: 57 mL/min — ABNORMAL LOW (ref >60)
Glucose, Bld: 88 mg/dL (ref 70–99)
Potassium: 3.7 mmol/L (ref 3.5–5.1)
Sodium: 137 mmol/L (ref 135–145)

## 2020-10-03 MED ORDER — CARVEDILOL 6.25 MG PO TABS: 6.2500 mg | ORAL_TABLET | Freq: Two times a day (BID) | ORAL | 0 refills | Status: DC

## 2020-10-03 MED ORDER — PANTOPRAZOLE SODIUM 40 MG PO TBEC: 40.0000 mg | DELAYED_RELEASE_TABLET | Freq: Every day | ORAL | 0 refills | Status: DC

## 2020-10-03 MED ORDER — ROSUVASTATIN CALCIUM 20 MG PO TABS: 20.0000 mg | ORAL_TABLET | Freq: Every day | ORAL | 0 refills | Status: DC

## 2020-10-03 MED ORDER — PROMETHAZINE HCL 12.5 MG PO TABS: 12.5000 mg | ORAL_TABLET | Freq: Four times a day (QID) | ORAL | 0 refills | Status: DC | PRN

## 2020-10-03 MED ORDER — LOSARTAN POTASSIUM 25 MG PO TABS: 12.5000 mg | ORAL_TABLET | Freq: Every day | ORAL | 0 refills | Status: DC

## 2020-10-03 MED FILL — ROSUVASTATIN CALCIUM 20 MG: 20 | 90 days supply | Qty: 90 | Fill #0

## 2020-10-03 MED FILL — CARVEDILOL 6.25 MG TABLET: 6.25 | 90 days supply | Qty: 180 | Fill #0

## 2020-10-03 MED FILL — LOSARTAN POTASSIUM 25 MG TA: 25 | 90 days supply | Qty: 45 | Fill #0

## 2020-10-03 MED FILL — PROMETHAZINE 12.5 MG TABLET: 12.5 | 7 days supply | Qty: 30 | Fill #0

## 2020-10-03 MED FILL — PANTOPRAZOLE SOD DR 40 MG T: 40 | 30 days supply | Qty: 30 | Fill #0

## 2020-10-03 NOTE — Discharge Summary (Signed)
Discharge Summary    Patient ID: Colleen Glenn MRN: [id number]; DOB: [birth year]  Admit date: 09/28/2020 Discharge date: 10/03/2020  Primary Care Provider: No primary care provider on file.  Primary Cardiologist: Sherren Mocha, MD  Primary Electrophysiologist:  None   Discharge Diagnoses    Active Problems:   Nausea & vomiting   Anterolateral myocardial infarction [facility])   Takotsubo syndrome   Gallstones    Diagnostic Studies/Procedures    Cath: 09/28/20  1. Severe segmental LV dysfunction, with classic pattern of acute Takotsubo syndrome (LV apical ballooning) 2. Nonobstructive coronary artery disease with moderate mid LAD stenosis, mild proximal RCA stenosis, and no evidence of high-grade obstruction 3. Mildly elevated LVEDP  Recommendations: Medical therapy, supportive care. Anticipate 48-hour hospitalization as long as no early complications arise. Repeat echocardiogram in about 3 months to assess for LV recovery. Treat with a beta-blocker as tolerated. Diagnostic Dominance: Right    Echo: 09/29/20  IMPRESSIONS    1. No thrombus (Definity utilized). Left ventricular ejection fraction,  by estimation, is 35 to 40%. The left ventricle has moderately decreased  function. The left ventricle demonstrates regional wall motion  abnormalities (see scoring diagram/findings  for description). Left ventricular diastolic parameters are consistent  with Grade I diastolic dysfunction (impaired relaxation).  2. The mitral valve is degenerative. Mild mitral valve regurgitation. No  evidence of mitral stenosis. Moderate mitral annular calcification.  3. The aortic valve is calcified. Aortic valve regurgitation is mild.  Mild aortic valve sclerosis is present, with no evidence of aortic valve  stenosis.  4. There is normal pulmonary artery systolic pressure. The estimated  right ventricular systolic pressure is 99.3 mmHg.  5. The inferior vena cava is  normal in size with greater than 50%  respiratory variability, suggesting right atrial pressure of 3 mmHg.   FINDINGS  Left Ventricle: No thrombus (Definity utilized). Left ventricular  ejection fraction, by estimation, is 35 to 40%. The left ventricle has  moderately decreased function. The left ventricle demonstrates regional  wall motion abnormalities. Definity contrast  agent was given IV to delineate the left ventricular endocardial borders.  Left ventricular diastolic parameters are consistent with Grade I  diastolic dysfunction (impaired relaxation).     LV Wall Scoring:  The mid and distal anterior septum, mid and distal inferior wall, and mid  inferoseptal segment are akinetic.   Right Ventricle: There is normal pulmonary artery systolic pressure. The  tricuspid regurgitant velocity is 2.00 m/s, and with an assumed right  atrial pressure of 3 mmHg, the estimated right ventricular systolic  pressure is 71.6 mmHg.   Left Atrium: Left atrial size was normal in size.   Right Atrium: Right atrial size was normal in size.   Pericardium: There is no evidence of pericardial effusion.   Mitral Valve: The mitral valve is degenerative in appearance. There is  moderate thickening of the mitral valve leaflet(s). There is moderate  calcification of the mitral valve leaflet(s). Moderate mitral annular  calcification. Mild mitral valve  regurgitation. No evidence of mitral valve stenosis.   Tricuspid Valve: Tricuspid valve regurgitation is trivial.   Aortic Valve: The aortic valve is calcified. Aortic valve regurgitation is  mild. Mild aortic valve sclerosis is present, with no evidence of aortic  valve stenosis.   Pulmonic Valve: The pulmonic valve was not well visualized. Pulmonic valve  regurgitation is not visualized.   Aorta: The aortic root is normal in size and structure.   Venous: The inferior vena cava is normal in  size with greater than 50%  respiratory  variability, suggesting right atrial pressure of 3 mmHg _____________   History of Present Illness     Colleen Glenn is a 79 y.o. female with past medical history of hypertension, hyperlipidemia, GERD, depression who presented with chest pain and STEMI called in the field.  Reported she had been intermittently nauseous over the past couple weeks with episodes of vomiting.  On the day of admission she developed substernal chest pain.  Her daughter called EMS and a code STEMI was activated in the field after EKG suggested anterior lateral STEMI.  She was brought directly to the cardiac catheterization lab and was experiencing 9/10 chest pain located in the center of her chest.  To need to have nausea.  No diaphoresis or shortness of breath.  States she had recently been evaluated in the emergency department on 09/19/2020 for concerns of TIA.  Her work-up was essentially negative.  She also actually tested positive for COVID-19, but was felt to be asymptomatic.  Also recently been diagnosed with obstructive common bile duct stone and was in the process of being evaluated for cholecystectomy.  Hospital Course     Consultants: GI, general surgery  1. NSTEMI with Taktosubo cardiomyopathy: underwent cardiac cath with Dr. Burt Knack noted above with mild nonobstructive coronary disease. Noted to have severe segmental LV dysfunction with appearance of Takotsubo.Followed up echo with 35-40%,mid and distal anterior septum, mid and distal inferior wall, and mid inferoseptal segment are akinetic.  Evaluated by cardiac rehab, and PT prior to discharge with recommendations for HHPT. -- continue Coreg 6.25mg  BID and losartan 12.5mg  daily as blood pressures are soft but tolerating  2. HTN:has been on Maxzide prior to admission, which is held during admission and time of discharge. Continue coreg as above with addition of losartan.  3. HLD: LDL 118,switchedstatin to Crestor 20mg  and continue  Zetia --LFTs/FLP in 8 weeks  4. COVID: tested positive on 11/30 during a work up in the ED for TIA which was negative. She has been asymptomatic.Now off precautions  5. Persistent n/v with cholelithiasis:seen by GI with adjustments made to help control n/v. She has not had any vomiting in over 24hrs. Seen by surgery with plans for conservative management as no evidence of cholecystitis. Continue protonix 40mg  daily -- appreciate assistance -- Keep outpatient follow-up as previously arranged prior to admission   Did the patient have an acute coronary syndrome (MI, NSTEMI, STEMI, etc) this admission?:  No.   The elevated Troponin was due to the acute medical illness (demand ischemia).      _____________  Discharge Vitals Blood pressure 123/79, pulse 76, temperature 97.9 F (36.6 C), temperature source Oral, resp. rate (!) 22, height 5\' 6"  (1.676 m), weight 64.3 kg, SpO2 99 %.  Filed Weights   09/28/20 1952 09/29/20 0544 09/30/20 0556  Weight: 67.1 kg 64.8 kg 64.3 kg    Labs & Radiologic Studies    CBC Recent Labs    10/01/20 1004 10/02/20 0623  WBC 8.7 6.6  HGB 13.2 12.5  HCT 39.3 37.7  MCV 92.9 93.1  PLT 226 301   Basic Metabolic Panel Recent Labs    10/01/20 1004 10/02/20 0623 10/03/20 0744  NA 135 137 137  K 3.8 3.5 3.7  CL 98 100 100  CO2 27 24 28   GLUCOSE 102* 73 88  BUN 32* 28* 21  CREATININE 1.36* 1.18* 1.01*  CALCIUM 8.9 8.4* 8.8*  MG 2.1 2.1  --  Liver Function Tests Recent Labs    10/01/20 1004  AST 34  ALT 17  ALKPHOS 69  BILITOT 0.9  PROT 5.9*  ALBUMIN 2.9*   Recent Labs    10/01/20 1702  LIPASE 24   High Sensitivity Troponin:   Recent Labs  Lab 09/19/20 0539 09/19/20 0802 09/28/20 1831 09/28/20 2027 09/30/20 1448  TROPONINIHS 4 4 4,566* 4,586* 2,517*    BNP Invalid input(s): POCBNP D-Dimer No results for input(s): DDIMER in the last 72 hours. Hemoglobin A1C No results for input(s): HGBA1C in the last 72  hours. Fasting Lipid Panel No results for input(s): CHOL, HDL, LDLCALC, TRIG, CHOLHDL, LDLDIRECT in the last 72 hours. Thyroid Function Tests Recent Labs    10/01/20 1004  TSH 3.148   _____________  CT Head Wo Contrast  Result Date: 09/19/2020 CLINICAL DATA:  Altered mental status EXAM: CT HEAD WITHOUT CONTRAST TECHNIQUE: Contiguous axial images were obtained from the base of the skull through the vertex without intravenous contrast. COMPARISON:  10/20/2019 brain MRI FINDINGS: Brain: No evidence of acute infarction, hemorrhage, hydrocephalus, extra-axial collection or mass lesion/mass effect. Mild chronic small vessel ischemia in the cerebral white matter. Age congruent cerebral volume loss. Vascular: Atheromatous calcification. Skull: Normal. Negative for fracture or focal lesion. Sinuses/Orbits: No acute finding. IMPRESSION: Senescent changes without acute or reversible finding. Electronically Signed   By: Monte Fantasia M.D.   On: 09/19/2020 06:25   MR ANGIO HEAD WO CONTRAST  Result Date: 09/19/2020 CLINICAL DATA:  TIA.  Slurred speech. EXAM: MRI HEAD WITHOUT CONTRAST MRA HEAD WITHOUT CONTRAST MRA NECK WITHOUT CONTRAST TECHNIQUE: Multiplanar, multiecho pulse sequences of the brain and surrounding structures were obtained without intravenous contrast. Angiographic images of the Circle of Willis were obtained using MRA technique without intravenous contrast. Angiographic images of the neck were obtained using MRA technique without intravenous contrast. Carotid stenosis measurements (when applicable) are obtained utilizing NASCET criteria, using the distal internal carotid diameter as the denominator. COMPARISON:  MRI 10/20/2019. FINDINGS: MRI HEAD FINDINGS Brain: No acute infarction, hemorrhage, hydrocephalus, extra-axial collection or mass lesion. Scattered T2/FLAIR hyperintensities within the white matter and pons, compatible with chronic microvascular ischemic disease. Generalized cerebral  volume loss with ex vacuo ventricular dilation. Skull and upper cervical spine: Normal marrow signal. Sinuses/Orbits: Mild scattered paranasal mucosal thickening. Other: No mastoid effusions. MRA HEAD FINDINGS Anterior circulation: Bilateral ICAs are patent to the carotid siphon without evidence of hemodynamically significant stenosis. The proximal MCA and ACA arteries are patent without evidence of hemodynamically significant stenosis or large vessel occlusion. There is mild stenosis of the right A1/A2 ACA more distal branches are poorly evaluated secondary to motion. No evidence of aneurysm. Posterior circulation: Bilateral intradural vertebral arteries are patent. Mild narrowing of the left intradural vertebral artery. The basilar artery is within normal limits. Bilateral PCA arteries are patent without evidence of proximal hemodynamically significant stenosis or large vessel occlusion. No evidence of aneurysm. MRA NECK FINDINGS Evaluation is limited by motion with nondiagnostic evaluation of the vessel origins and limited evaluation at the carotid bifurcations. Bilateral vertebral arteries and carotid arteries are patent. No visible flow limiting stenosis. IMPRESSION: 1. No evidence of acute intracranial abnormality. Chronic microvascular ischemic disease. 2. No emergent vascular finding or visible flow limiting stenosis in the head or neck with evaluation in the neck limited by motion. Electronically Signed   By: Margaretha Sheffield MD   On: 09/19/2020 10:06   MR MRA NECK WO CONTRAST  Result Date: 09/19/2020 CLINICAL DATA:  TIA.  Slurred speech. EXAM: MRI HEAD WITHOUT CONTRAST MRA HEAD WITHOUT CONTRAST MRA NECK WITHOUT CONTRAST TECHNIQUE: Multiplanar, multiecho pulse sequences of the brain and surrounding structures were obtained without intravenous contrast. Angiographic images of the Circle of Willis were obtained using MRA technique without intravenous contrast. Angiographic images of the neck were  obtained using MRA technique without intravenous contrast. Carotid stenosis measurements (when applicable) are obtained utilizing NASCET criteria, using the distal internal carotid diameter as the denominator. COMPARISON:  MRI 10/20/2019. FINDINGS: MRI HEAD FINDINGS Brain: No acute infarction, hemorrhage, hydrocephalus, extra-axial collection or mass lesion. Scattered T2/FLAIR hyperintensities within the white matter and pons, compatible with chronic microvascular ischemic disease. Generalized cerebral volume loss with ex vacuo ventricular dilation. Skull and upper cervical spine: Normal marrow signal. Sinuses/Orbits: Mild scattered paranasal mucosal thickening. Other: No mastoid effusions. MRA HEAD FINDINGS Anterior circulation: Bilateral ICAs are patent to the carotid siphon without evidence of hemodynamically significant stenosis. The proximal MCA and ACA arteries are patent without evidence of hemodynamically significant stenosis or large vessel occlusion. There is mild stenosis of the right A1/A2 ACA more distal branches are poorly evaluated secondary to motion. No evidence of aneurysm. Posterior circulation: Bilateral intradural vertebral arteries are patent. Mild narrowing of the left intradural vertebral artery. The basilar artery is within normal limits. Bilateral PCA arteries are patent without evidence of proximal hemodynamically significant stenosis or large vessel occlusion. No evidence of aneurysm. MRA NECK FINDINGS Evaluation is limited by motion with nondiagnostic evaluation of the vessel origins and limited evaluation at the carotid bifurcations. Bilateral vertebral arteries and carotid arteries are patent. No visible flow limiting stenosis. IMPRESSION: 1. No evidence of acute intracranial abnormality. Chronic microvascular ischemic disease. 2. No emergent vascular finding or visible flow limiting stenosis in the head or neck with evaluation in the neck limited by motion. Electronically Signed   By:  Margaretha Sheffield MD   On: 09/19/2020 10:06   MR BRAIN WO CONTRAST  Result Date: 09/19/2020 CLINICAL DATA:  TIA.  Slurred speech. EXAM: MRI HEAD WITHOUT CONTRAST MRA HEAD WITHOUT CONTRAST MRA NECK WITHOUT CONTRAST TECHNIQUE: Multiplanar, multiecho pulse sequences of the brain and surrounding structures were obtained without intravenous contrast. Angiographic images of the Circle of Willis were obtained using MRA technique without intravenous contrast. Angiographic images of the neck were obtained using MRA technique without intravenous contrast. Carotid stenosis measurements (when applicable) are obtained utilizing NASCET criteria, using the distal internal carotid diameter as the denominator. COMPARISON:  MRI 10/20/2019. FINDINGS: MRI HEAD FINDINGS Brain: No acute infarction, hemorrhage, hydrocephalus, extra-axial collection or mass lesion. Scattered T2/FLAIR hyperintensities within the white matter and pons, compatible with chronic microvascular ischemic disease. Generalized cerebral volume loss with ex vacuo ventricular dilation. Skull and upper cervical spine: Normal marrow signal. Sinuses/Orbits: Mild scattered paranasal mucosal thickening. Other: No mastoid effusions. MRA HEAD FINDINGS Anterior circulation: Bilateral ICAs are patent to the carotid siphon without evidence of hemodynamically significant stenosis. The proximal MCA and ACA arteries are patent without evidence of hemodynamically significant stenosis or large vessel occlusion. There is mild stenosis of the right A1/A2 ACA more distal branches are poorly evaluated secondary to motion. No evidence of aneurysm. Posterior circulation: Bilateral intradural vertebral arteries are patent. Mild narrowing of the left intradural vertebral artery. The basilar artery is within normal limits. Bilateral PCA arteries are patent without evidence of proximal hemodynamically significant stenosis or large vessel occlusion. No evidence of aneurysm. MRA NECK  FINDINGS Evaluation is limited by motion with nondiagnostic evaluation of the vessel origins and  limited evaluation at the carotid bifurcations. Bilateral vertebral arteries and carotid arteries are patent. No visible flow limiting stenosis. IMPRESSION: 1. No evidence of acute intracranial abnormality. Chronic microvascular ischemic disease. 2. No emergent vascular finding or visible flow limiting stenosis in the head or neck with evaluation in the neck limited by motion. Electronically Signed   By: Margaretha Sheffield MD   On: 09/19/2020 10:06   US Abdomen Complete  Result Date: 10/01/2020 CLINICAL DATA:  Nausea and vomiting EXAM: ABDOMEN ULTRASOUND COMPLETE COMPARISON:  None. FINDINGS: Gallbladder: Gallbladder is nondistended however there is a 1 cm calculus within lumen the gallbladder towards the neck. No gallbladder wall thickening. No pericholecystic fluid. Negative sonographic Murphy's sign. Common bile duct: Diameter: Enlarged measuring 7 mm proximally to 14 mm distally. Liver: Echogenic liver lesion measuring 10 mm with posterior shadowing likely a benign calcification. Within normal limits in parenchymal echogenicity. Portal vein is patent on color Doppler imaging with normal direction of blood flow towards the liver. IVC: No abnormality visualized. Pancreas: Visualized portion unremarkable. Spleen: Size and appearance within normal limits. Right Kidney: Length: 9.9 cm.  No hydronephrosis Left Kidney: Length: Is 9.7 cm.  No hydronephrosis Abdominal aorta: No aneurysm visualized. Other findings: No ascites IMPRESSION: 1. Distension of the common bile duct up to 14 mm. 2. Cholelithiasis without evidence cholecystitis. 3. Consider MRI/MRCP for evaluation of the common bile duct. Electronically Signed   By: Suzy Bouchard M.D.   On: 10/01/2020 11:44   US Abdomen Complete  Result Date: 09/12/2020 CLINICAL DATA:  Epigastric pain EXAM: ABDOMEN ULTRASOUND COMPLETE COMPARISON:  None. FINDINGS:  Gallbladder: 12 mm stone in the gallbladder neck. Sludge seen within the gallbladder. No wall thickening or sonographic Murphy's sign. Common bile duct: Diameter: Normal caliber, 5 mm Liver: No focal lesion identified. Within normal limits in parenchymal echogenicity. Portal vein is patent on color Doppler imaging with normal direction of blood flow towards the liver. IVC: No abnormality visualized. Pancreas: Visualized portion unremarkable. Spleen: Size and appearance within normal limits. Right Kidney: Length: 10.0 cm. Echogenicity within normal limits. No mass or hydronephrosis visualized. Left Kidney: Length: 9.2 cm. Echogenicity within normal limits. No mass or hydronephrosis visualized. Abdominal aorta: No aneurysm visualized. Other findings: None. IMPRESSION: 1.2 cm non mobile gallstone in the region of the gallbladder neck. No sonographic evidence of acute cholecystitis. Layering sludge within the gallbladder. Electronically Signed   By: Rolm Baptise M.D.   On: 09/12/2020 13:07   CARDIAC CATHETERIZATION  Result Date: 09/28/2020 1.  Severe segmental LV dysfunction, with classic pattern of acute Takotsubo syndrome (LV apical ballooning) 2.  Nonobstructive coronary artery disease with moderate mid LAD stenosis, mild proximal RCA stenosis, and no evidence of high-grade obstruction 3.  Mildly elevated LVEDP Recommendations: Medical therapy, supportive care.  Anticipate 48-hour hospitalization as long as no early complications arise.  Repeat echocardiogram in about 3 months to assess for LV recovery.  Treat with a beta-blocker as tolerated.  DG Chest Port 1 View  Result Date: 09/28/2020 CLINICAL DATA:  STEMI EXAM: PORTABLE CHEST 1 VIEW COMPARISON:  09/19/2020 and prior FINDINGS: No focal consolidation. No pneumothorax or pleural effusion. Stable cardiomediastinal silhouette. Sequela of left shoulder arthroplasty. Epigastric surgical clips. IMPRESSION: No focal airspace disease. Electronically Signed   By:  Primitivo Gauze M.D.   On: 09/28/2020 20:18   DG Chest Portable 1 View  Result Date: 09/19/2020 CLINICAL DATA:  Cough.  Altered mental status. EXAM: PORTABLE CHEST 1 VIEW COMPARISON:  05/05/2012.  12/05/2009. FINDINGS: Mediastinum  and hilar structures normal. Low lung volumes. Right perihilar atelectasis/infiltrate. Follow-up exams suggested to demonstrate resolution and to exclude underlying mass. Mild left base subsegmental atelectasis. No pleural effusion or pneumothorax. Prior left shoulder replacement. Surgical clips upper abdomen. IMPRESSION: 1. Low lung volumes. Right perihilar atelectasis/infiltrate. Follow-up exams suggested to demonstrate resolution and to exclude underlying mass. 2. Mild left base subsegmental atelectasis. Electronically Signed   By: Marcello Moores  Register   On: 09/19/2020 05:50   ECHOCARDIOGRAM LIMITED  Result Date: 09/30/2020    ECHOCARDIOGRAM LIMITED REPORT   Patient Name:   Colleen Glenn Heupel Date of Exam: 09/29/2020 Medical Rec #:  [id number]           Height:       66.0 in Accession #:    [id number]          Weight:       142.9 lb Date of Birth:  [birth year]           BSA:          1.734 m Patient Age:    72 years            BP:           132/88 mmHg Patient Gender: F                   HR:           88 bpm. Exam Location:  Inpatient Procedure: Limited Echo, Cardiac Doppler, Color Doppler and Intracardiac            Opacification Agent Indications:    Acute myocardial infarction  History:        Patient has no prior history of Echocardiogram examinations.                 Acute MI; Risk Factors:Former Smoker. GERD.  Sonographer:    Mikki Santee RDCS (AE) Sonographer#2:  Clayton Lefort RDCS (AE) Referring Phys: [place]  1. No thrombus (Definity utilized). Left ventricular ejection fraction, by estimation, is 35 to 40%. The left ventricle has moderately decreased function. The left ventricle demonstrates regional wall motion abnormalities (see scoring  diagram/findings for description). Left ventricular diastolic parameters are consistent with Grade I diastolic dysfunction (impaired relaxation).  2. The mitral valve is degenerative. Mild mitral valve regurgitation. No evidence of mitral stenosis. Moderate mitral annular calcification.  3. The aortic valve is calcified. Aortic valve regurgitation is mild. Mild aortic valve sclerosis is present, with no evidence of aortic valve stenosis.  4. There is normal pulmonary artery systolic pressure. The estimated right ventricular systolic pressure is 38.4 mmHg.  5. The inferior vena cava is normal in size with greater than 50% respiratory variability, suggesting right atrial pressure of 3 mmHg. FINDINGS  Left Ventricle: No thrombus (Definity utilized). Left ventricular ejection fraction, by estimation, is 35 to 40%. The left ventricle has moderately decreased function. The left ventricle demonstrates regional wall motion abnormalities. Definity contrast  agent was given IV to delineate the left ventricular endocardial borders. Left ventricular diastolic parameters are consistent with Grade I diastolic dysfunction (impaired relaxation).  LV Wall Scoring: The mid and distal anterior septum, mid and distal inferior wall, and mid inferoseptal segment are akinetic. Right Ventricle: There is normal pulmonary artery systolic pressure. The tricuspid regurgitant velocity is 2.00 m/s, and with an assumed right atrial pressure of 3 mmHg, the estimated right ventricular systolic pressure is 66.5 mmHg. Left Atrium: Left atrial size was normal in size. Right Atrium:  Right atrial size was normal in size. Pericardium: There is no evidence of pericardial effusion. Mitral Valve: The mitral valve is degenerative in appearance. There is moderate thickening of the mitral valve leaflet(s). There is moderate calcification of the mitral valve leaflet(s). Moderate mitral annular calcification. Mild mitral valve regurgitation. No evidence of  mitral valve stenosis. Tricuspid Valve: Tricuspid valve regurgitation is trivial. Aortic Valve: The aortic valve is calcified. Aortic valve regurgitation is mild. Mild aortic valve sclerosis is present, with no evidence of aortic valve stenosis. Pulmonic Valve: The pulmonic valve was not well visualized. Pulmonic valve regurgitation is not visualized. Aorta: The aortic root is normal in size and structure. Venous: The inferior vena cava is normal in size with greater than 50% respiratory variability, suggesting right atrial pressure of 3 mmHg. IVC IVC diam: 1.50 cm LEFT ATRIUM             Index LA Vol (A2C):   44.8 ml 25.84 ml/m LA Vol (A4C):   45.0 ml 25.96 ml/m LA Biplane Vol: 47.9 ml 27.63 ml/m  TRICUSPID VALVE TR Peak grad:   16.0 mmHg TR Vmax:        200.00 cm/s Candee Furbish MD Electronically signed by Candee Furbish MD Signature Date/Time: 09/30/2020/7:02:15 AM    Final    Disposition   Pt is being discharged home today in good condition.  Follow-up Plans & Appointments     Follow-up Information    Imogene Burn, PA-C Follow up on 10/25/2020.   Specialty: Cardiology Why: at 11:45am for your follow up appt Contact information: [address] [place] [address] [phone]              Discharge Instructions    Amb Referral to Cardiac Rehabilitation   Complete by: As directed    Diagnosis: NSTEMI   After initial evaluation and assessments completed: Virtual Based Care may be provided alone or in conjunction with Phase 2 Cardiac Rehab based on patient barriers.: Yes   Face-to-face encounter (required for Medicare/Medicaid patients)   Complete by: As directed    I Reino Bellis certify that this patient is under my care and that I, or a nurse practitioner or physician's assistant working with me, had a face-to-face encounter that meets the physician face-to-face encounter requirements with this patient on 10/03/2020. The encounter with the patient was in whole,  or in part for the following medical condition(s) which is the primary reason for home health care (List medical condition): Deconditioned, CHF   The encounter with the patient was in whole, or in part, for the following medical condition, which is the primary reason for home health care: Deconditioned   I certify that, based on my findings, the following services are medically necessary home health services: Physical therapy   Reason for Medically Necessary Home Health Services: Other See Comments   My clinical findings support the need for the above services: OTHER SEE COMMENTS   Further, I certify that my clinical findings support that this patient is homebound due to: Ambulates short distances less than 300 feet   Home Health   Complete by: As directed    To provide the following care/treatments: PT      Discharge Medications   Allergies as of 10/03/2020   No Known Allergies     Medication List    STOP taking these medications   omeprazole 20 MG tablet Commonly known as: PRILOSEC OTC   simvastatin 20 MG tablet Commonly known as: ZOCOR  triamterene-hydrochlorothiazide 37.5-25 MG tablet Commonly known as: MAXZIDE-25     TAKE these medications   acetaminophen 325 MG tablet Commonly known as: TYLENOL Take 325-650 mg by mouth every 6 (six) hours as needed for mild pain.   aspirin EC 81 MG tablet Take 81 mg by mouth at bedtime. Swallow whole.   buPROPion 300 MG 24 hr tablet Commonly known as: WELLBUTRIN XL Take 300 mg by mouth daily before breakfast.   carvedilol 6.25 MG tablet Commonly known as: COREG Take 1 tablet (6.25 mg total) by mouth 2 (two) times daily with a meal.   cyclobenzaprine 5 MG tablet Commonly known as: FLEXERIL Take 5 mg by mouth 3 (three) times daily as needed for muscle spasms.   donepezil 10 MG tablet Commonly known as: ARICEPT Take 10 mg by mouth at bedtime.   DULoxetine 60 MG capsule Commonly known as: CYMBALTA Take 60 mg by mouth  daily.   ezetimibe 10 MG tablet Commonly known as: ZETIA Take 10 mg by mouth daily.   gabapentin 300 MG capsule Commonly known as: NEURONTIN TAKE 1 CAPSULE(300 MG) BY MOUTH AT BEDTIME What changed: See the new instructions.   losartan 25 MG tablet Commonly known as: COZAAR Take 0.5 tablets (12.5 mg total) by mouth daily. Start taking on: October 04, 2020   Myrbetriq 50 MG Tb24 tablet Generic drug: mirabegron ER Take 50 mg by mouth daily.   naproxen sodium 220 MG tablet Commonly known as: ALEVE Take 220-440 mg by mouth 2 (two) times daily as needed (for arthritic pain).   ondansetron 8 MG disintegrating tablet Commonly known as: ZOFRAN-ODT Take 8 mg by mouth every 8 (eight) hours as needed for nausea/vomiting (DISSOLVE ORALLY).   pantoprazole 40 MG tablet Commonly known as: PROTONIX Take 1 tablet (40 mg total) by mouth daily at 6 (six) AM. Start taking on: October 04, 2020   polyethylene glycol powder 17 GM/SCOOP powder Commonly known as: GLYCOLAX/MIRALAX Take 17 g by mouth daily.   raloxifene 60 MG tablet Commonly known as: EVISTA Take 60 mg by mouth daily.   rosuvastatin 20 MG tablet Commonly known as: CRESTOR Take 1 tablet (20 mg total) by mouth daily. Start taking on: October 04, 2020   vitamin B-12 1000 MCG tablet Commonly known as: CYANOCOBALAMIN Take 1,000 mcg by mouth daily.   Vitamin D-3 125 MCG (5000 UT) Tabs Take 5,000 Units by mouth daily after breakfast.        Outstanding Labs/Studies   Echo in 3 months  Duration of Discharge Encounter   Greater than 30 minutes including physician time.  Signed, Reino Bellis, NP 10/03/2020, 1:02 PM

## 2020-10-03 NOTE — Care Management Important Message (Signed)
Important Message  Patient Details  Name: Colleen Glenn MRN: [id number] Date of Birth: Apr 24, 1941   Medicare Important Message Given:  Yes     Treyshaun Keatts P [place] 10/03/2020, 3:19 PM

## 2020-10-03 NOTE — Progress Notes (Signed)
Progress Note  Patient Name: Colleen Glenn Date of Encounter: 10/03/2020  [facility] Cardiologist: Sherren Mocha, MD   Subjective   Feeling better this morning. No vomiting over the past 24 hrs. Generally weak.   Inpatient Medications    Scheduled Meds: . aspirin EC  81 mg Oral Daily  . buPROPion  300 mg Oral QAC breakfast  . carvedilol  6.25 mg Oral BID WC  . cholecalciferol  5,000 Units Oral Daily  . donepezil  10 mg Oral QHS  . DULoxetine  60 mg Oral Daily  . ezetimibe  10 mg Oral Daily  . feeding supplement  237 mL Oral TID BM  . gabapentin  300 mg Oral QHS  . heparin  5,000 Units Subcutaneous Q8H  . losartan  12.5 mg Oral Daily  . mirabegron ER  50 mg Oral Daily  . multivitamin with minerals  1 tablet Oral Daily  . pantoprazole  40 mg Oral Q0600  . raloxifene  60 mg Oral Daily  . rosuvastatin  20 mg Oral Daily  . sodium chloride flush  3 mL Intravenous Q12H   Continuous Infusions: . sodium chloride    . sodium chloride     PRN Meds: sodium chloride, acetaminophen, cyclobenzaprine, diazepam, promethazine, sodium chloride flush   Vital Signs    Vitals:   10/02/20 1703 10/02/20 2100 10/03/20 0046 10/03/20 0450  BP: 123/71 (!) 101/53 (!) 105/57 104/63  Pulse: 75 67 74 76  Resp:  19 17 18   Temp:  97.8 F (36.6 C) 98.4 F (36.9 C) 97.9 F (36.6 C)  TempSrc:  Oral Oral Oral  SpO2:  97% 98% 99%  Weight:      Height:       No intake or output data in the 24 hours ending 10/03/20 0749 Last 3 Weights 09/30/2020 09/29/2020 09/28/2020  Weight (lbs) 141 lb 12.8 oz 142 lb 14.4 oz 147 lb 14.9 oz  Weight (kg) 64.32 kg 64.819 kg 67.1 kg      Telemetry    SR with freq PVCs - Personally Reviewed  ECG    No new tracing  Physical Exam  Pleasant older female GEN: No acute distress.   Neck: No JVD Cardiac: RRR, no murmurs, rubs, or gallops.  Respiratory: Clear to auscultation bilaterally. GI: Soft, nontender, non-distended  MS: No edema; No  deformity. Neuro:  Nonfocal  Psych: Normal affect   Labs    High Sensitivity Troponin:   Recent Labs  Lab 09/19/20 0539 09/19/20 0802 09/28/20 1831 09/28/20 2027 09/30/20 1448  TROPONINIHS 4 4 4,566* 4,586* 2,517*      Chemistry Recent Labs  Lab 09/28/20 1831 09/28/20 1846 09/28/20 2027 10/01/20 1004 10/02/20 0623  NA 139 140  --  135 137  K 4.5 3.8  --  3.8 3.5  CL 100 101  --  98 100  CO2 23  --   --  27 24  GLUCOSE 126* 124*  --  102* 73  BUN 19 17  --  32* 28*  CREATININE 0.90 0.70 0.89 1.36* 1.18*  CALCIUM 9.0  --   --  8.9 8.4*  PROT 6.3*  --   --  5.9*  --   ALBUMIN 3.3*  --   --  2.9*  --   AST 55*  --   --  34  --   ALT 16  --   --  17  --   ALKPHOS 78  --   --  69  --  BILITOT 1.1  --   --  0.9  --   GFRNONAA >60  --  >60 40* 47*  ANIONGAP 16*  --   --  10 13     Hematology Recent Labs  Lab 09/28/20 2027 10/01/20 1004 10/02/20 0623  WBC 7.2 8.7 6.6  RBC 5.15* 4.23 4.05  HGB 16.0* 13.2 12.5  HCT 47.9* 39.3 37.7  MCV 93.0 92.9 93.1  MCH 31.1 31.2 30.9  MCHC 33.4 33.6 33.2  RDW 13.8 13.6 13.6  PLT 243 226 221    BNPNo results for input(s): BNP, PROBNP in the last 168 hours.   DDimer No results for input(s): DDIMER in the last 168 hours.   Radiology    US Abdomen Complete  Result Date: 10/01/2020 CLINICAL DATA:  Nausea and vomiting EXAM: ABDOMEN ULTRASOUND COMPLETE COMPARISON:  None. FINDINGS: Gallbladder: Gallbladder is nondistended however there is a 1 cm calculus within lumen the gallbladder towards the neck. No gallbladder wall thickening. No pericholecystic fluid. Negative sonographic Murphy's sign. Common bile duct: Diameter: Enlarged measuring 7 mm proximally to 14 mm distally. Liver: Echogenic liver lesion measuring 10 mm with posterior shadowing likely a benign calcification. Within normal limits in parenchymal echogenicity. Portal vein is patent on color Doppler imaging with normal direction of blood flow towards the liver. IVC: No  abnormality visualized. Pancreas: Visualized portion unremarkable. Spleen: Size and appearance within normal limits. Right Kidney: Length: 9.9 cm.  No hydronephrosis Left Kidney: Length: Is 9.7 cm.  No hydronephrosis Abdominal aorta: No aneurysm visualized. Other findings: No ascites IMPRESSION: 1. Distension of the common bile duct up to 14 mm. 2. Cholelithiasis without evidence cholecystitis. 3. Consider MRI/MRCP for evaluation of the common bile duct. Electronically Signed   By: Suzy Bouchard M.D.   On: 10/01/2020 11:44    Cardiac Studies   Cath: 09/28/20  1. Severe segmental LV dysfunction, with classic pattern of acute Takotsubo syndrome (LV apical ballooning) 2. Nonobstructive coronary artery disease with moderate mid LAD stenosis, mild proximal RCA stenosis, and no evidence of high-grade obstruction 3. Mildly elevated LVEDP  Recommendations: Medical therapy, supportive care. Anticipate 48-hour hospitalization as long as no early complications arise. Repeat echocardiogram in about 3 months to assess for LV recovery. Treat with a beta-blocker as tolerated. Diagnostic Dominance: Right    Echo: 09/29/20  IMPRESSIONS    1. No thrombus (Definity utilized). Left ventricular ejection fraction,  by estimation, is 35 to 40%. The left ventricle has moderately decreased  function. The left ventricle demonstrates regional wall motion  abnormalities (see scoring diagram/findings  for description). Left ventricular diastolic parameters are consistent  with Grade I diastolic dysfunction (impaired relaxation).  2. The mitral valve is degenerative. Mild mitral valve regurgitation. No  evidence of mitral stenosis. Moderate mitral annular calcification.  3. The aortic valve is calcified. Aortic valve regurgitation is mild.  Mild aortic valve sclerosis is present, with no evidence of aortic valve  stenosis.  4. There is normal pulmonary artery systolic pressure. The estimated   right ventricular systolic pressure is 78.4 mmHg.  5. The inferior vena cava is normal in size with greater than 50%  respiratory variability, suggesting right atrial pressure of 3 mmHg.   FINDINGS  Left Ventricle: No thrombus (Definity utilized). Left ventricular  ejection fraction, by estimation, is 35 to 40%. The left ventricle has  moderately decreased function. The left ventricle demonstrates regional  wall motion abnormalities. Definity contrast  agent was given IV to delineate the left ventricular endocardial borders.  Left ventricular diastolic parameters are consistent with Grade I  diastolic dysfunction (impaired relaxation).     LV Wall Scoring:  The mid and distal anterior septum, mid and distal inferior wall, and mid  inferoseptal segment are akinetic.   Right Ventricle: There is normal pulmonary artery systolic pressure. The  tricuspid regurgitant velocity is 2.00 m/s, and with an assumed right  atrial pressure of 3 mmHg, the estimated right ventricular systolic  pressure is 82.7 mmHg.   Left Atrium: Left atrial size was normal in size.   Right Atrium: Right atrial size was normal in size.   Pericardium: There is no evidence of pericardial effusion.   Mitral Valve: The mitral valve is degenerative in appearance. There is  moderate thickening of the mitral valve leaflet(s). There is moderate  calcification of the mitral valve leaflet(s). Moderate mitral annular  calcification. Mild mitral valve  regurgitation. No evidence of mitral valve stenosis.   Tricuspid Valve: Tricuspid valve regurgitation is trivial.   Aortic Valve: The aortic valve is calcified. Aortic valve regurgitation is  mild. Mild aortic valve sclerosis is present, with no evidence of aortic  valve stenosis.   Pulmonic Valve: The pulmonic valve was not well visualized. Pulmonic valve  regurgitation is not visualized.   Aorta: The aortic root is normal in size and structure.   Venous: The  inferior vena cava is normal in size with greater than 50%  respiratory variability, suggesting right atrial pressure of 3 mmHg.   Patient Profile     78 y.o. female with PMH of HTN, HLD, GERD, depression who presented with chest pain and STEMI called in the ED given ST elevation in anterolateral leads.  Assessment & Plan    1. NSTEMI with Taktosubo cardiomyopathy: underwent cardiac cath with Dr. Burt Knack noted above with mild nonobstructive coronary disease. Noted to have severe segmental LV dysfunction with appearance of Takotsubo. Followed up echo with 35-40%,  mid and distal anterior septum, mid and distal inferior wall, and mid inferoseptal segment are akinetic.  -- continue Coreg 6.25mg  BID and losartan 12.5mg  daily as blood pressures are soft but tolerating  2. HTN:has been on Maxzide prior to admission, which is held. Continue coreg as above with addition of losartan.   3. HLD: LDL 118, switched statin to Crestor 20mg  and continue Zetia  4. COVID: tested positive on 11/30 during a work up in the ED for TIA which was negative. She has been asymptomatic.Now off precautions  5. Persistent n/v with cholelithiasis: seen by GI with adjustments made to help control n/v. She has not had any vomiting in over 24hrs. Seen by surgery with plans for conservative management as no evidence of cholecystitis. Continue protonix 40mg  daily -- appreciate assistance  Dispo: follow up on Cr on morning labs and will have her ambulate with CR. If tolerates well then possible DC this afternoon.   For questions or updates, please contact [place] Please consult [email or web address] for contact info under        Signed, Reino Bellis, NP  10/03/2020, 7:49 AM

## 2020-10-03 NOTE — Plan of Care (Signed)
  Problem: Clinical Measurements: Goal: Will remain free from infection Outcome: Progressing Goal: Diagnostic test results will improve Outcome: Progressing   

## 2020-10-03 NOTE — TOC Transition Note (Addendum)
Transition of Care (TOC) - CM/SW Discharge Note Marvetta Gibbons RN, BSN Transitions of Care Unit 4E- RN Case Manager See Treatment Team for direct phone #    Patient Details  Name: Colleen Glenn MRN: [id number] Date of Birth: 1941/08/06  Transition of Care [facility]) CM/SW Contact:  Dawayne Patricia, RN Phone Number: 10/03/2020, 3:04 PM   Clinical Narrative:    Pt stable for transition home today, order placed for HHPT- spoke with pt at daughter at the bedside- choice offered with list Per CMS guidelines from medicare.gov website with star ratings (copy placed in shadow chart)- pt daughter they would like to try [facility] for [facility] needs. Pt has needed DME at home. Daughter to transport home.  Per pt her PCP is Catering manager Call made to [facility] for HHPT referral- referral pending [facility] notified that referral has been accepted for HHPT needs   Final next level of care: Home w Home Health Services Barriers to Discharge: No Barriers Identified   Patient Goals and CMS Choice Patient states their goals for this hospitalization and ongoing recovery are:: get stronger CMS Medicare.gov Compare Post Acute Care list provided to:: Patient Choice offered to / list presented to : [place]  Discharge Placement                Home with [facility]        Discharge Plan and Services   Discharge Planning Services: CM Consult Post Acute Care Choice: Home Health          DME Arranged: N/A DME Agency: NA       HH Arranged: PT [place] Agency: [place] Date [place]: 10/03/20 Time HH Agency Contacted: 1430 Representative spoke with at [place]: [place] Determinants of Health ([place]) Interventions     Readmission Risk Interventions Readmission Risk Prevention Plan 10/03/2020  Post Dischage Appt Complete  Medication Screening Complete  Transportation Screening Complete  Some recent data might be hidden

## 2020-10-03 NOTE — Evaluation (Signed)
Physical Therapy Evaluation Patient Details Name: Colleen Glenn MRN: [id number] DOB: 07-Jan-1941 Today's Date: 10/03/2020   History of Present Illness  Pt is a 79 y/o female admitted secondary to NSTEMI with Taktosubo cardiomyopathy. Pt is s/p cardiac cath. PMH includes BPPV and HTN.  Clinical Impression  Pt admitted secondary to problem above with deficits below. Pt limited this session secondary to nausea. Required min guard A for ambulation using RW. R knee instability noted, however, pt reports that as baseline. Pt reports she plans to go stay with her daughter at d/c. Recommending HHPT at d/c. Will continue to follow acutely.     Follow Up Recommendations Home health PT;Supervision for mobility/OOB    Equipment Recommendations  None recommended by PT    Recommendations for Other Services       Precautions / Restrictions Precautions Precautions: Fall Restrictions Weight Bearing Restrictions: No      Mobility  Bed Mobility Overal bed mobility: Modified Independent                  Transfers Overall transfer level: Needs assistance Equipment used: Rolling walker (2 wheeled) Transfers: Sit to/from Stand Sit to Stand: Min guard         General transfer comment: Min guard for safety  Ambulation/Gait Ambulation/Gait assistance: Min guard Gait Distance (Feet): 50 Feet Assistive device: Rolling walker (2 wheeled) Gait Pattern/deviations: Step-through pattern;Decreased stride length Gait velocity: Decreased   General Gait Details: Noted mild R knee instability throughout. Min guard for safety. Distance limited seconday to nausea.  Stairs            Wheelchair Mobility    Modified Rankin (Stroke Patients Only)       Balance Overall balance assessment: Needs assistance Sitting-balance support: No upper extremity supported;Feet supported Sitting balance-Leahy Scale: Good     Standing balance support: Bilateral upper extremity  supported Standing balance-Leahy Scale: Poor Standing balance comment: Reliant on BUE support                             Pertinent Vitals/Pain Pain Assessment: No/denies pain    Home Living Family/patient expects to be discharged to:: Private residence Living Arrangements: Alone Available Help at Discharge: Family;Available 24 hours/day Type of Home: House Home Access: Ramped entrance     Home Layout: One level Home Equipment: Clinical cytogeneticist - 2 wheels      Prior Function Level of Independence: Independent with assistive device(s)         Comments: Has been using RW     Hand Dominance        Extremity/Trunk Assessment   Upper Extremity Assessment Upper Extremity Assessment: Overall WFL for tasks assessed    Lower Extremity Assessment Lower Extremity Assessment: RLE deficits/detail RLE Deficits / Details: Report R knee pain and instability    Cervical / Trunk Assessment Cervical / Trunk Assessment: Normal  Communication   Communication: HOH  Cognition Arousal/Alertness: Awake/alert Behavior During Therapy: WFL for tasks assessed/performed Overall Cognitive Status: Within Functional Limits for tasks assessed                                        General Comments      Exercises     Assessment/Plan    PT Assessment Patient needs continued PT services  PT Problem List Decreased strength;Decreased balance;Decreased mobility;Decreased knowledge of use  of DME;Decreased knowledge of precautions;Pain       PT Treatment Interventions Gait training;DME instruction;Therapeutic activities;Functional mobility training;Stair training;Balance training;Therapeutic exercise;Patient/family education    PT Goals (Current goals can be found in the Care Plan section)  Acute Rehab PT Goals Patient Stated Goal: to go home PT Goal Formulation: With patient Time For Goal Achievement: 10/17/20 Potential to Achieve Goals: Good     Frequency Min 3X/week   Barriers to discharge        Co-evaluation               AM-PAC PT "6 Clicks" Mobility  Outcome Measure Help needed turning from your back to your side while in a flat bed without using bedrails?: None Help needed moving from lying on your back to sitting on the side of a flat bed without using bedrails?: None Help needed moving to and from a bed to a chair (including a wheelchair)?: A Little Help needed standing up from a chair using your arms (e.g., wheelchair or bedside chair)?: A Little Help needed to walk in hospital room?: A Little Help needed climbing 3-5 steps with a railing? : A Little 6 Click Score: 20    End of Session   Activity Tolerance: Treatment limited secondary to medical complications (Comment) (nausea) Patient left: in bed;with call bell/phone within reach;with bed alarm set Nurse Communication: Mobility status PT Visit Diagnosis: Unsteadiness on feet (R26.81);Muscle weakness (generalized) (M62.81)    Time: 0722-5750 PT Time Calculation (min) (ACUTE ONLY): 15 min   Charges:   PT Evaluation $PT Eval Low Complexity: 1 Low          Lou Miner, DPT  Acute Rehabilitation Services  Pager: [phone] Office: [phone]   Rudean Hitt 10/03/2020, 1:25 PM

## 2020-10-03 NOTE — Progress Notes (Signed)
CARDIAC REHAB PHASE I   PRE:  Rate/Rhythm: 91 SR  BP:  Supine: 123/79  Sitting:   Standing:    SaO2: 95%RA  MODE:  Ambulation: 250 ft   POST:  Rate/Rhythm: 112 ST   BP:  Supine:   Sitting: 82/65, retook immediately at 116/61  Standing:    SaO2: 96%RA 0840-0949 Pt walked 250 ft on RA with rolling walker and asst x1.. Pt stated she has walker at home and was getting HHPT for balance but did not get to complete. She is going to stay with her daughter for a few days. Would recommend PT eval prior to discharge for d/c recommendations. Gait fairly steady and denied CP or dizziness. Has not walked much until today. Put bed alarm back on. Gave pt CHF booklet and discussed zone of when to call MD with symptoms. Discussed daily weights, 2000 mg sodium restriction, walking as tolerated in house with assistance, MI restrictions, CRP 2. Referral to CRP 2 [place]. Pt voiced understanding of ed. Encouraged her to have daughter read material also. NP to put in order for PT consult.    Graylon Good, RN BSN  10/03/2020 9:37 AM

## 2020-10-10 DIAGNOSIS — F410 Panic disorder [episodic paroxysmal anxiety] without agoraphobia: Secondary | ICD-10-CM | POA: Diagnosis not present

## 2020-10-10 DIAGNOSIS — I5030 Unspecified diastolic (congestive) heart failure: Secondary | ICD-10-CM | POA: Diagnosis not present

## 2020-10-10 DIAGNOSIS — K649 Unspecified hemorrhoids: Secondary | ICD-10-CM | POA: Diagnosis not present

## 2020-10-10 DIAGNOSIS — K279 Peptic ulcer, site unspecified, unspecified as acute or chronic, without hemorrhage or perforation: Secondary | ICD-10-CM | POA: Diagnosis not present

## 2020-10-10 DIAGNOSIS — E559 Vitamin D deficiency, unspecified: Secondary | ICD-10-CM | POA: Diagnosis not present

## 2020-10-10 DIAGNOSIS — I110 Hypertensive heart disease with heart failure: Secondary | ICD-10-CM | POA: Diagnosis not present

## 2020-10-10 DIAGNOSIS — Z9049 Acquired absence of other specified parts of digestive tract: Secondary | ICD-10-CM | POA: Diagnosis not present

## 2020-10-10 DIAGNOSIS — M8580 Other specified disorders of bone density and structure, unspecified site: Secondary | ICD-10-CM | POA: Diagnosis not present

## 2020-10-10 DIAGNOSIS — I5181 Takotsubo syndrome: Secondary | ICD-10-CM | POA: Diagnosis not present

## 2020-10-10 DIAGNOSIS — Z8619 Personal history of other infectious and parasitic diseases: Secondary | ICD-10-CM | POA: Diagnosis not present

## 2020-10-10 DIAGNOSIS — Z90710 Acquired absence of both cervix and uterus: Secondary | ICD-10-CM | POA: Diagnosis not present

## 2020-10-10 DIAGNOSIS — K635 Polyp of colon: Secondary | ICD-10-CM | POA: Diagnosis not present

## 2020-10-10 DIAGNOSIS — I2109 ST elevation (STEMI) myocardial infarction involving other coronary artery of anterior wall: Secondary | ICD-10-CM | POA: Diagnosis not present

## 2020-10-10 DIAGNOSIS — H8110 Benign paroxysmal vertigo, unspecified ear: Secondary | ICD-10-CM | POA: Diagnosis not present

## 2020-10-10 DIAGNOSIS — Z8616 Personal history of COVID-19: Secondary | ICD-10-CM | POA: Diagnosis not present

## 2020-10-10 DIAGNOSIS — K5790 Diverticulosis of intestine, part unspecified, without perforation or abscess without bleeding: Secondary | ICD-10-CM | POA: Diagnosis not present

## 2020-10-10 DIAGNOSIS — R413 Other amnesia: Secondary | ICD-10-CM | POA: Diagnosis not present

## 2020-10-10 DIAGNOSIS — K8051 Calculus of bile duct without cholangitis or cholecystitis with obstruction: Secondary | ICD-10-CM | POA: Diagnosis not present

## 2020-10-10 DIAGNOSIS — K219 Gastro-esophageal reflux disease without esophagitis: Secondary | ICD-10-CM | POA: Diagnosis not present

## 2020-10-10 DIAGNOSIS — E785 Hyperlipidemia, unspecified: Secondary | ICD-10-CM | POA: Diagnosis not present

## 2020-10-10 DIAGNOSIS — M549 Dorsalgia, unspecified: Secondary | ICD-10-CM | POA: Diagnosis not present

## 2020-10-10 DIAGNOSIS — F32A Depression, unspecified: Secondary | ICD-10-CM | POA: Diagnosis not present

## 2020-10-10 DIAGNOSIS — M1990 Unspecified osteoarthritis, unspecified site: Secondary | ICD-10-CM | POA: Diagnosis not present

## 2020-10-10 DIAGNOSIS — I2510 Atherosclerotic heart disease of native coronary artery without angina pectoris: Secondary | ICD-10-CM | POA: Diagnosis not present

## 2020-10-10 DIAGNOSIS — N3281 Overactive bladder: Secondary | ICD-10-CM | POA: Diagnosis not present

## 2020-10-18 DIAGNOSIS — E785 Hyperlipidemia, unspecified: Secondary | ICD-10-CM | POA: Diagnosis not present

## 2020-10-18 DIAGNOSIS — I5030 Unspecified diastolic (congestive) heart failure: Secondary | ICD-10-CM | POA: Diagnosis not present

## 2020-10-18 DIAGNOSIS — I2510 Atherosclerotic heart disease of native coronary artery without angina pectoris: Secondary | ICD-10-CM | POA: Diagnosis not present

## 2020-10-18 DIAGNOSIS — I110 Hypertensive heart disease with heart failure: Secondary | ICD-10-CM | POA: Diagnosis not present

## 2020-10-18 DIAGNOSIS — I5181 Takotsubo syndrome: Secondary | ICD-10-CM | POA: Diagnosis not present

## 2020-10-18 DIAGNOSIS — Z23 Encounter for immunization: Secondary | ICD-10-CM | POA: Diagnosis not present

## 2020-10-18 DIAGNOSIS — I2109 ST elevation (STEMI) myocardial infarction involving other coronary artery of anterior wall: Secondary | ICD-10-CM | POA: Diagnosis not present

## 2020-10-20 DIAGNOSIS — E785 Hyperlipidemia, unspecified: Secondary | ICD-10-CM | POA: Diagnosis not present

## 2020-10-20 DIAGNOSIS — I2109 ST elevation (STEMI) myocardial infarction involving other coronary artery of anterior wall: Secondary | ICD-10-CM | POA: Diagnosis not present

## 2020-10-20 DIAGNOSIS — I2510 Atherosclerotic heart disease of native coronary artery without angina pectoris: Secondary | ICD-10-CM | POA: Diagnosis not present

## 2020-10-20 DIAGNOSIS — I5181 Takotsubo syndrome: Secondary | ICD-10-CM | POA: Diagnosis not present

## 2020-10-20 DIAGNOSIS — I110 Hypertensive heart disease with heart failure: Secondary | ICD-10-CM | POA: Diagnosis not present

## 2020-10-20 DIAGNOSIS — I5030 Unspecified diastolic (congestive) heart failure: Secondary | ICD-10-CM | POA: Diagnosis not present

## 2020-10-23 NOTE — Progress Notes (Signed)
Cardiology Office Note    Date:  10/25/2020   ID:  Colleen, Glenn 04-Nov-1940, MRN [id number]  PCP:  Patient, No Pcp Per  Cardiologist: Sherren Mocha, MD EPS: None  Chief Complaint  Patient presents with  . Follow-up    History of Present Illness:  Colleen Glenn is a 80 y.o. female with history of hypertension, HLD, GERD, depression.  She was admitted with chest pain and code STEMI was called after EKG in route suggested anterior lateral STEMI.  Cardiac cath showed mild nonobstructive disease and she was found to have Takotsubo cardiomyopathy.  Follow-up EF function echo 35 to 40% 09/28/2020.  She incidentally tested positive for Covid 09/19/2020  in the ED for concerns of TIA work-up was negative.  She was asymptomatic with Covid.  Patient also has cholelithiasis with ongoing nausea and vomiting followed by GI.  Patient comes in accompanied by her daughter. She still has nausea and vomiting. Also happens when she has anxiety. Has always been high stress. Has had a little bit up an ache in her chest and it goes away with laying down and deep breathing. Thinks it may be stress related. Has PT coming to the house.     Past Medical History:  Diagnosis Date  . Anxiety    on meds  . Arthritis    OA bil shoulders, hands, back  . Back pain   . BPPV (benign paroxysmal positional vertigo) 10/2019  . Bursitis   . Colon polyps    adenomatous  . Colon polyps   . Depression    on meds  . Diverticulosis   . GERD (gastroesophageal reflux disease)    on meds  . Hemorrhoids   . Hyperlipidemia    on meds  . Mild hypertension    white coat syndrome- on meds  . OAB (overactive bladder)   . Osteopenia   . Panic disorder 11/2012  . Plantar fasciitis    hx of  . PUD (peptic ulcer disease)   . Shingles   . Tremor    mild action tremor suspected 2020  . Vitamin D deficiency    on meds    Past Surgical History:  Procedure Laterality Date  . ABDOMINAL HYSTERECTOMY     . APPENDECTOMY    . BACK SURGERY    . COLONOSCOPY  2016   JP-MAC-TA  . FOOT SURGERY     bilateral   . HEMORRHOID SURGERY    . JOINT REPLACEMENT Right 2014  . laproscopic knee surgery Right    2018  . LEFT HEART CATH AND CORONARY ANGIOGRAPHY N/A 09/28/2020   Procedure: LEFT HEART CATH AND CORONARY ANGIOGRAPHY;  Surgeon: Sherren Mocha, MD;  Location: [facility];  Service: Cardiovascular;  Laterality: N/A;  . LUMBAR LAMINECTOMY/DECOMPRESSION MICRODISCECTOMY  05/07/2012   Procedure: LUMBAR LAMINECTOMY/DECOMPRESSION MICRODISCECTOMY 1 LEVEL;  Surgeon: Eustace Moore, MD;  Location: [facility];  Service: Neurosurgery;  Laterality: Right;  Lumbar Laminectomy Decompression Microdiscectomy Lumbar Three-Four  . multiple foot surgeries    . POLYPECTOMY  2016   TA  . REVERSE SHOULDER ARTHROPLASTY Left 07/12/2020   Procedure: REVERSE SHOULDER ARTHROPLASTY;  Surgeon: Hiram Gash, MD;  Location: [facility];  Service: Orthopedics;  Laterality: Left;  . ROTATOR CUFF REPAIR     right side  . TONSILLECTOMY    . TOTAL HIP ARTHROPLASTY  11/16/2012   Procedure: TOTAL HIP ARTHROPLASTY;  Surgeon: Gearlean Alf, MD;  Location: [facility];  Service:  Orthopedics;  Laterality: Right;  Right Total Hip Arthroplasty  . TUBAL LIGATION    . VAGOTOMY     approx. 35 years ago, states has a clamp mid epigastric area    Current Medications: Current Meds  Medication Sig  . acetaminophen (TYLENOL) 325 MG tablet Take 325-650 mg by mouth every 6 (six) hours as needed for mild pain.  Marland Kitchen aspirin EC 81 MG tablet Take 81 mg by mouth at bedtime. Swallow whole.  Marland Kitchen buPROPion (WELLBUTRIN XL) 300 MG 24 hr tablet Take 300 mg by mouth daily before breakfast.   . Cholecalciferol (VITAMIN D-3) 125 MCG (5000 UT) TABS Take 5,000 Units by mouth daily after breakfast.  . cyclobenzaprine (FLEXERIL) 5 MG tablet Take 5 mg by mouth 3 (three) times daily as needed for muscle spasms.  Marland Kitchen donepezil (ARICEPT) 10 MG tablet  Take 10 mg by mouth at bedtime.  . DULoxetine (CYMBALTA) 60 MG capsule Take 60 mg by mouth daily.  Marland Kitchen ezetimibe (ZETIA) 10 MG tablet Take 10 mg by mouth daily.  Marland Kitchen gabapentin (NEURONTIN) 300 MG capsule TAKE 1 CAPSULE(300 MG) BY MOUTH AT BEDTIME (Patient taking differently: Take 300 mg by mouth at bedtime.)  . losartan (COZAAR) 25 MG tablet Take 0.5 tablets (12.5 mg total) by mouth daily.  Marland Kitchen MYRBETRIQ 50 MG TB24 tablet Take 50 mg by mouth daily.  . naproxen sodium (ALEVE) 220 MG tablet Take 220-440 mg by mouth 2 (two) times daily as needed (for arthritic pain).  . ondansetron (ZOFRAN-ODT) 8 MG disintegrating tablet Take 8 mg by mouth every 8 (eight) hours as needed for nausea/vomiting (DISSOLVE ORALLY).  Marland Kitchen pantoprazole (PROTONIX) 40 MG tablet Take 1 tablet (40 mg total) by mouth daily at 6 (six) AM.  . polyethylene glycol powder (GLYCOLAX/MIRALAX) 17 GM/SCOOP powder Take 17 g by mouth daily.  . promethazine (PHENERGAN) 12.5 MG tablet Take 1 tablet (12.5 mg total) by mouth every 6 (six) hours as needed for nausea or vomiting.  . raloxifene (EVISTA) 60 MG tablet Take 60 mg by mouth daily.  . rosuvastatin (CRESTOR) 20 MG tablet Take 1 tablet (20 mg total) by mouth daily.  . vitamin B-12 (CYANOCOBALAMIN) 1000 MCG tablet Take 1,000 mcg by mouth daily.  . [DISCONTINUED] carvedilol (COREG) 6.25 MG tablet Take 1 tablet (6.25 mg total) by mouth 2 (two) times daily with a meal.     Allergies:   Patient has no known allergies.   Social History   Socioeconomic History  . Marital status: Widowed    Spouse name: Not on file  . Number of children: 3  . Years of education: cosmetology degree  . Highest education level: GED or equivalent  Occupational History  . Occupation: Retired  Tobacco Use  . Smoking status: Former Smoker    Packs/day: 1.00    Years: 20.00    Pack years: 20.00    Types: Cigarettes    Quit date: 1983    Years since quitting: 39.0  . Smokeless tobacco: Never Used  . Tobacco  comment: 45 years ago  Vaping Use  . Vaping Use: Never used  Substance and Sexual Activity  . Alcohol use: Yes    Alcohol/week: 2.0 - 4.0 standard drinks    Types: 2 - 4 Glasses of wine per week    Comment: every 3-4 days  . Drug use: No  . Sexual activity: Not Currently    Partners: Male  Other Topics Concern  . Not on file  Social History Narrative  Lives alone   Right handed   Social Determinants of Health   Financial Resource Strain: Not on file  Food Insecurity: Not on file  Transportation Needs: Not on file  Physical Activity: Not on file  Stress: Not on file  Social Connections: Not on file     Family History:  The patient's family history includes Colon polyps in her mother; Congestive Heart Failure in her mother; Heart disease in her father; Hypertension in her mother; Lung cancer (age of onset: 26) in her father; Stroke in her mother and another family member.   ROS:   Please see the history of present illness.    ROS All other systems reviewed and are negative.   PHYSICAL EXAM:   VS:  BP 128/68   Pulse 76   Ht _0  (1.676 m)   Wt 148 lb (67.1 kg)   SpO2 98%   BMI 23.89 kg/m   Physical Exam  GEN: Well nourished, well developed, in no acute distress  Neck: no JVD, carotid bruits, or masses Cardiac:RRR; no murmurs, rubs, or gallops  Respiratory:  clear to auscultation bilaterally, normal work of breathing GI: soft, nontender, nondistended, + BS Ext: without cyanosis, clubbing, or edema, Good distal pulses bilaterally Neuro:  Alert and Oriented x 3 Psych: euthymic mood, full affect  Wt Readings from Last 3 Encounters:  10/25/20 148 lb (67.1 kg)  09/30/20 141 lb 12.8 oz (64.3 kg)  09/18/20 156 lb (70.8 kg)      Studies/Labs Reviewed:   EKG:  EKG is not ordered today.    Recent Labs: 10/01/2020: ALT 17; TSH 3.148 10/02/2020: Hemoglobin 12.5; Magnesium 2.1; Platelets 221 10/03/2020: BUN 21; Creatinine, Ser 1.01; Potassium 3.7; Sodium 137    Lipid Panel    Component Value Date/Time   CHOL 188 09/28/2020 1831   TRIG 128 09/28/2020 1831   HDL 44 09/28/2020 1831   CHOLHDL 4.3 09/28/2020 1831   VLDL 26 09/28/2020 1831   LDLCALC 118 (H) 09/28/2020 1831    Additional studies/ records that were reviewed today include:  Echo: 09/29/20   IMPRESSIONS     1. No thrombus (Definity utilized). Left ventricular ejection fraction,  by estimation, is 35 to 40%. The left ventricle has moderately decreased  function. The left ventricle demonstrates regional wall motion  abnormalities (see scoring diagram/findings  for description). Left ventricular diastolic parameters are consistent  with Grade I diastolic dysfunction (impaired relaxation).   2. The mitral valve is degenerative. Mild mitral valve regurgitation. No  evidence of mitral stenosis. Moderate mitral annular calcification.   3. The aortic valve is calcified. Aortic valve regurgitation is mild.  Mild aortic valve sclerosis is present, with no evidence of aortic valve  stenosis.   4. There is normal pulmonary artery systolic pressure. The estimated  right ventricular systolic pressure is 16.1 mmHg.   5. The inferior vena cava is normal in size with greater than 50%  respiratory variability, suggesting right atrial pressure of 3 mmHg.   FINDINGS   Left Ventricle: No thrombus (Definity utilized). Left ventricular  ejection fraction, by estimation, is 35 to 40%. The left ventricle has  moderately decreased function. The left ventricle demonstrates regional  wall motion abnormalities. Definity contrast   agent was given IV to delineate the left ventricular endocardial borders.  Left ventricular diastolic parameters are consistent with Grade I  diastolic dysfunction (impaired relaxation).      LV Wall Scoring:  The mid and distal anterior septum, mid and distal inferior  wall, and mid  inferoseptal segment are akinetic.   Right Ventricle: There is normal pulmonary  artery systolic pressure. The  tricuspid regurgitant velocity is 2.00 m/s, and with an assumed right  atrial pressure of 3 mmHg, the estimated right ventricular systolic  pressure is 73.2 mmHg.   Left Atrium: Left atrial size was normal in size.   Right Atrium: Right atrial size was normal in size.   Pericardium: There is no evidence of pericardial effusion.   Mitral Valve: The mitral valve is degenerative in appearance. There is  moderate thickening of the mitral valve leaflet(s). There is moderate  calcification of the mitral valve leaflet(s). Moderate mitral annular  calcification. Mild mitral valve  regurgitation. No evidence of mitral valve stenosis.   Tricuspid Valve: Tricuspid valve regurgitation is trivial.   Aortic Valve: The aortic valve is calcified. Aortic valve regurgitation is  mild. Mild aortic valve sclerosis is present, with no evidence of aortic  valve stenosis.   Pulmonic Valve: The pulmonic valve was not well visualized. Pulmonic valve  regurgitation is not visualized.   Aorta: The aortic root is normal in size and structure.   Venous: The inferior vena cava is normal in size with greater than 50%  respiratory variability, suggesting right atrial pressure of 3 mmHg _____________ Cath: 09/28/20   1.  Severe segmental LV dysfunction, with classic pattern of acute Takotsubo syndrome (LV apical ballooning) 2.  Nonobstructive coronary artery disease with moderate mid LAD stenosis, mild proximal RCA stenosis, and no evidence of high-grade obstruction 3.  Mildly elevated LVEDP   Recommendations: Medical therapy, supportive care.  Anticipate 48-hour hospitalization as long as no early complications arise.  Repeat echocardiogram in about 3 months to assess for LV recovery.  Treat with a beta-blocker as tolerated.    Risk Assessment/Calculations:         ASSESSMENT:    1. Takotsubo syndrome   2. Essential hypertension   3. Hyperlipidemia, unspecified  hyperlipidemia type   4. Cholelithiasis without cholecystitis   5. Stress      PLAN:  In order of problems listed above:  NSTEMI with Takotsubo cardiomyopathy, cardiac cath with mild nonobstructive CAD but severe segmental LV dysfunction.  Follow-up echo EF 35 to 40% with mid and distal anterior septum and mid and distal inferior wall and mid inferior septal segment akinesis.  Sent home on Coreg 6.25 mg twice daily and losartan 12.5 mg daily.  Blood pressure was soft. Overall doing well, some chest aching when stressed eases with deep breathing. Increase carvedilol 12.5 mg bid  Hypertension previously on Maxide which was stopped during hospitalization. BP controlled, increase carvedilol  Hyperlipidemia on crestor -LDL 118 in Dec. Should have rechecked in about 3 months  Persistent nausea and vomiting with cholelithiasis followed by GI with plans for conservative management  COVID-19 + 09/19/2020 found incidentally in the ED during work-up for TIA which was negative.  Stress/anxiety-recommend she see a counselor but she has declined at this time. She can discuss further with PCP  Shared Decision Making/Informed Consent        Medication Adjustments/Labs and Tests Ordered: Current medicines are reviewed at length with the patient today.  Concerns regarding medicines are outlined above.  Medication changes, Labs and Tests ordered today are listed in the Patient Instructions below. Patient Instructions  Medication Instructions:  Your physician has recommended you make the following change in your medication:   INCREASE: Carvedilol 12.45m twice daily  *If you need a refill on your cardiac  medications before your next appointment, please call your pharmacy*   Lab Work: TODAY: BMET  If you have labs (blood work) drawn today and your tests are completely normal, you will receive your results only by: Marland Kitchen MyChart Message (if you have MyChart) OR . A paper copy in the mail If you have  any lab test that is abnormal or we need to change your treatment, we will call you to review the results.   Testing/Procedures: Your physician has requested that you have an echocardiogram. Echocardiography is a painless test that uses sound waves to create images of your heart. It provides your doctor with information about the size and shape of your heart and how well your heart's chambers and valves are working. This procedure takes approximately one hour. There are no restrictions for this procedure.  Follow-Up: At [facility], you and your health needs are our priority.  As part of our continuing mission to provide you with exceptional heart care, we have created designated Provider Care Teams.  These Care Teams include your primary Cardiologist (physician) and Advanced Practice Providers (APPs -  Physician Assistants and Nurse Practitioners) who all work together to provide you with the care you need, when you need it.  We recommend signing up for the patient portal called "MyChart".  Sign up information is provided on this After Visit Summary.  MyChart is used to connect with patients for Virtual Visits (Telemedicine).  Patients are able to view lab/test results, encounter notes, upcoming appointments, etc.  Non-urgent messages can be sent to your provider as well.   To learn more about what you can do with MyChart, go to [email or web address].    Your next appointment:   Follow Up with Sherren Mocha, MD after Echo      Signed, Ermalinda Barrios, PA-C  10/25/2020 12:37 PM    [address], [place], [place]  [address] Phone: [phone]; Fax: [phone]

## 2020-10-24 DIAGNOSIS — I5030 Unspecified diastolic (congestive) heart failure: Secondary | ICD-10-CM | POA: Diagnosis not present

## 2020-10-24 DIAGNOSIS — I2109 ST elevation (STEMI) myocardial infarction involving other coronary artery of anterior wall: Secondary | ICD-10-CM | POA: Diagnosis not present

## 2020-10-24 DIAGNOSIS — M25512 Pain in left shoulder: Secondary | ICD-10-CM | POA: Diagnosis not present

## 2020-10-24 DIAGNOSIS — I5181 Takotsubo syndrome: Secondary | ICD-10-CM | POA: Diagnosis not present

## 2020-10-24 DIAGNOSIS — I110 Hypertensive heart disease with heart failure: Secondary | ICD-10-CM | POA: Diagnosis not present

## 2020-10-24 DIAGNOSIS — E785 Hyperlipidemia, unspecified: Secondary | ICD-10-CM | POA: Diagnosis not present

## 2020-10-24 DIAGNOSIS — I2510 Atherosclerotic heart disease of native coronary artery without angina pectoris: Secondary | ICD-10-CM | POA: Diagnosis not present

## 2020-10-25 ENCOUNTER — Other Ambulatory Visit: Payer: Self-pay

## 2020-10-25 ENCOUNTER — Ambulatory Visit (INDEPENDENT_AMBULATORY_CARE_PROVIDER_SITE_OTHER): Payer: Medicare Other | Admitting: Physician Assistant

## 2020-10-25 ENCOUNTER — Encounter: Payer: Self-pay | Admitting: Physician Assistant

## 2020-10-25 VITALS — BP 128/68 | HR 76 | Ht 66.0 in | Wt 148.0 lb

## 2020-10-25 DIAGNOSIS — E785 Hyperlipidemia, unspecified: Secondary | ICD-10-CM | POA: Diagnosis not present

## 2020-10-25 DIAGNOSIS — K8020 Calculus of gallbladder without cholecystitis without obstruction: Secondary | ICD-10-CM

## 2020-10-25 DIAGNOSIS — I5181 Takotsubo syndrome: Secondary | ICD-10-CM

## 2020-10-25 DIAGNOSIS — I10 Essential (primary) hypertension: Secondary | ICD-10-CM | POA: Diagnosis not present

## 2020-10-25 DIAGNOSIS — F439 Reaction to severe stress, unspecified: Secondary | ICD-10-CM

## 2020-10-25 MED ORDER — CARVEDILOL 12.5 MG PO TABS: 12.5000 mg | ORAL_TABLET | Freq: Two times a day (BID) | ORAL | 3 refills | Status: DC

## 2020-10-25 NOTE — Patient Instructions (Signed)
Medication Instructions:  Your physician has recommended you make the following change in your medication:   INCREASE: Carvedilol 12.5mg  twice daily  *If you need a refill on your cardiac medications before your next appointment, please call your pharmacy*   Lab Work: TODAY: BMET  If you have labs (blood work) drawn today and your tests are completely normal, you will receive your results only by: Marland Kitchen MyChart Message (if you have MyChart) OR . A paper copy in the mail If you have any lab test that is abnormal or we need to change your treatment, we will call you to review the results.   Testing/Procedures: Your physician has requested that you have an echocardiogram. Echocardiography is a painless test that uses sound waves to create images of your heart. It provides your doctor with information about the size and shape of your heart and how well your heart's chambers and valves are working. This procedure takes approximately one hour. There are no restrictions for this procedure.  Follow-Up: At [facility], you and your health needs are our priority.  As part of our continuing mission to provide you with exceptional heart care, we have created designated Provider Care Teams.  These Care Teams include your primary Cardiologist (physician) and Advanced Practice Providers (APPs -  Physician Assistants and Nurse Practitioners) who all work together to provide you with the care you need, when you need it.  We recommend signing up for the patient portal called "MyChart".  Sign up information is provided on this After Visit Summary.  MyChart is used to connect with patients for Virtual Visits (Telemedicine).  Patients are able to view lab/test results, encounter notes, upcoming appointments, etc.  Non-urgent messages can be sent to your provider as well.   To learn more about what you can do with MyChart, go to [email or web address].    Your next appointment:   Follow Up with Tonny Bollman,  MD after Echo

## 2020-10-26 DIAGNOSIS — I2510 Atherosclerotic heart disease of native coronary artery without angina pectoris: Secondary | ICD-10-CM | POA: Diagnosis not present

## 2020-10-26 DIAGNOSIS — I2109 ST elevation (STEMI) myocardial infarction involving other coronary artery of anterior wall: Secondary | ICD-10-CM | POA: Diagnosis not present

## 2020-10-26 DIAGNOSIS — I110 Hypertensive heart disease with heart failure: Secondary | ICD-10-CM | POA: Diagnosis not present

## 2020-10-26 DIAGNOSIS — M25569 Pain in unspecified knee: Secondary | ICD-10-CM | POA: Diagnosis not present

## 2020-10-26 DIAGNOSIS — E785 Hyperlipidemia, unspecified: Secondary | ICD-10-CM | POA: Diagnosis not present

## 2020-10-26 DIAGNOSIS — K8020 Calculus of gallbladder without cholecystitis without obstruction: Secondary | ICD-10-CM | POA: Diagnosis not present

## 2020-10-26 DIAGNOSIS — E538 Deficiency of other specified B group vitamins: Secondary | ICD-10-CM | POA: Diagnosis not present

## 2020-10-26 DIAGNOSIS — I10 Essential (primary) hypertension: Secondary | ICD-10-CM | POA: Diagnosis not present

## 2020-10-26 DIAGNOSIS — F329 Major depressive disorder, single episode, unspecified: Secondary | ICD-10-CM | POA: Diagnosis not present

## 2020-10-26 DIAGNOSIS — K219 Gastro-esophageal reflux disease without esophagitis: Secondary | ICD-10-CM | POA: Diagnosis not present

## 2020-10-26 DIAGNOSIS — I5181 Takotsubo syndrome: Secondary | ICD-10-CM | POA: Diagnosis not present

## 2020-10-26 DIAGNOSIS — I5030 Unspecified diastolic (congestive) heart failure: Secondary | ICD-10-CM | POA: Diagnosis not present

## 2020-10-26 LAB — BASIC METABOLIC PANEL
BUN/Creatinine Ratio: 15 (ref 12–28)
BUN: 12 mg/dL (ref 8–27)
CO2: 22 mmol/L (ref 20–29)
Calcium: 9 mg/dL (ref 8.7–10.3)
Chloride: 105 mmol/L (ref 96–106)
Creatinine, Ser: 0.8 mg/dL (ref 0.57–1.00)
GFR calc Af Amer: 82 mL/min/{1.73_m2} (ref >59)
GFR calc non Af Amer: 71 mL/min/{1.73_m2} (ref >59)
Glucose: 92 mg/dL (ref 65–99)
Potassium: 4.4 mmol/L (ref 3.5–5.2)
Sodium: 143 mmol/L (ref 134–144)

## 2020-11-09 DIAGNOSIS — M159 Polyosteoarthritis, unspecified: Secondary | ICD-10-CM | POA: Diagnosis not present

## 2020-11-09 DIAGNOSIS — Z9181 History of falling: Secondary | ICD-10-CM | POA: Diagnosis not present

## 2020-11-09 DIAGNOSIS — K5790 Diverticulosis of intestine, part unspecified, without perforation or abscess without bleeding: Secondary | ICD-10-CM | POA: Diagnosis not present

## 2020-11-09 DIAGNOSIS — F32A Depression, unspecified: Secondary | ICD-10-CM | POA: Diagnosis not present

## 2020-11-09 DIAGNOSIS — I5181 Takotsubo syndrome: Secondary | ICD-10-CM | POA: Diagnosis not present

## 2020-11-09 DIAGNOSIS — K8020 Calculus of gallbladder without cholecystitis without obstruction: Secondary | ICD-10-CM | POA: Diagnosis not present

## 2020-11-09 DIAGNOSIS — I252 Old myocardial infarction: Secondary | ICD-10-CM | POA: Diagnosis not present

## 2020-11-09 DIAGNOSIS — E559 Vitamin D deficiency, unspecified: Secondary | ICD-10-CM | POA: Diagnosis not present

## 2020-11-09 DIAGNOSIS — Z87891 Personal history of nicotine dependence: Secondary | ICD-10-CM | POA: Diagnosis not present

## 2020-11-09 DIAGNOSIS — I2510 Atherosclerotic heart disease of native coronary artery without angina pectoris: Secondary | ICD-10-CM | POA: Diagnosis not present

## 2020-11-09 DIAGNOSIS — H8110 Benign paroxysmal vertigo, unspecified ear: Secondary | ICD-10-CM | POA: Diagnosis not present

## 2020-11-09 DIAGNOSIS — K219 Gastro-esophageal reflux disease without esophagitis: Secondary | ICD-10-CM | POA: Diagnosis not present

## 2020-11-09 DIAGNOSIS — Z7982 Long term (current) use of aspirin: Secondary | ICD-10-CM | POA: Diagnosis not present

## 2020-11-09 DIAGNOSIS — U071 COVID-19: Secondary | ICD-10-CM | POA: Diagnosis not present

## 2020-11-09 DIAGNOSIS — M8580 Other specified disorders of bone density and structure, unspecified site: Secondary | ICD-10-CM | POA: Diagnosis not present

## 2020-11-09 DIAGNOSIS — E785 Hyperlipidemia, unspecified: Secondary | ICD-10-CM | POA: Diagnosis not present

## 2020-11-09 DIAGNOSIS — M549 Dorsalgia, unspecified: Secondary | ICD-10-CM | POA: Diagnosis not present

## 2020-11-09 DIAGNOSIS — I10 Essential (primary) hypertension: Secondary | ICD-10-CM | POA: Diagnosis not present

## 2020-11-09 DIAGNOSIS — N3281 Overactive bladder: Secondary | ICD-10-CM | POA: Diagnosis not present

## 2020-11-09 DIAGNOSIS — F419 Anxiety disorder, unspecified: Secondary | ICD-10-CM | POA: Diagnosis not present

## 2020-11-13 DIAGNOSIS — U071 COVID-19: Secondary | ICD-10-CM | POA: Diagnosis not present

## 2020-11-13 DIAGNOSIS — I10 Essential (primary) hypertension: Secondary | ICD-10-CM | POA: Diagnosis not present

## 2020-11-13 DIAGNOSIS — I252 Old myocardial infarction: Secondary | ICD-10-CM | POA: Diagnosis not present

## 2020-11-13 DIAGNOSIS — I5181 Takotsubo syndrome: Secondary | ICD-10-CM | POA: Diagnosis not present

## 2020-11-13 DIAGNOSIS — K8020 Calculus of gallbladder without cholecystitis without obstruction: Secondary | ICD-10-CM | POA: Diagnosis not present

## 2020-11-13 DIAGNOSIS — I2510 Atherosclerotic heart disease of native coronary artery without angina pectoris: Secondary | ICD-10-CM | POA: Diagnosis not present

## 2020-11-17 DIAGNOSIS — I252 Old myocardial infarction: Secondary | ICD-10-CM | POA: Diagnosis not present

## 2020-11-17 DIAGNOSIS — I5181 Takotsubo syndrome: Secondary | ICD-10-CM | POA: Diagnosis not present

## 2020-11-17 DIAGNOSIS — I2510 Atherosclerotic heart disease of native coronary artery without angina pectoris: Secondary | ICD-10-CM | POA: Diagnosis not present

## 2020-11-17 DIAGNOSIS — U071 COVID-19: Secondary | ICD-10-CM | POA: Diagnosis not present

## 2020-11-17 DIAGNOSIS — K8020 Calculus of gallbladder without cholecystitis without obstruction: Secondary | ICD-10-CM | POA: Diagnosis not present

## 2020-11-17 DIAGNOSIS — I10 Essential (primary) hypertension: Secondary | ICD-10-CM | POA: Diagnosis not present

## 2020-11-21 DIAGNOSIS — I2510 Atherosclerotic heart disease of native coronary artery without angina pectoris: Secondary | ICD-10-CM | POA: Diagnosis not present

## 2020-11-21 DIAGNOSIS — K8020 Calculus of gallbladder without cholecystitis without obstruction: Secondary | ICD-10-CM | POA: Diagnosis not present

## 2020-11-21 DIAGNOSIS — I10 Essential (primary) hypertension: Secondary | ICD-10-CM | POA: Diagnosis not present

## 2020-11-21 DIAGNOSIS — I5181 Takotsubo syndrome: Secondary | ICD-10-CM | POA: Diagnosis not present

## 2020-11-21 DIAGNOSIS — I252 Old myocardial infarction: Secondary | ICD-10-CM | POA: Diagnosis not present

## 2020-11-21 DIAGNOSIS — U071 COVID-19: Secondary | ICD-10-CM | POA: Diagnosis not present

## 2020-11-23 DIAGNOSIS — I2510 Atherosclerotic heart disease of native coronary artery without angina pectoris: Secondary | ICD-10-CM | POA: Diagnosis not present

## 2020-11-23 DIAGNOSIS — U071 COVID-19: Secondary | ICD-10-CM | POA: Diagnosis not present

## 2020-11-23 DIAGNOSIS — I5181 Takotsubo syndrome: Secondary | ICD-10-CM | POA: Diagnosis not present

## 2020-11-23 DIAGNOSIS — K8020 Calculus of gallbladder without cholecystitis without obstruction: Secondary | ICD-10-CM | POA: Diagnosis not present

## 2020-11-23 DIAGNOSIS — I10 Essential (primary) hypertension: Secondary | ICD-10-CM | POA: Diagnosis not present

## 2020-11-23 DIAGNOSIS — I252 Old myocardial infarction: Secondary | ICD-10-CM | POA: Diagnosis not present

## 2020-11-28 DIAGNOSIS — I2510 Atherosclerotic heart disease of native coronary artery without angina pectoris: Secondary | ICD-10-CM | POA: Diagnosis not present

## 2020-11-28 DIAGNOSIS — K8020 Calculus of gallbladder without cholecystitis without obstruction: Secondary | ICD-10-CM | POA: Diagnosis not present

## 2020-11-28 DIAGNOSIS — I10 Essential (primary) hypertension: Secondary | ICD-10-CM | POA: Diagnosis not present

## 2020-11-28 DIAGNOSIS — U071 COVID-19: Secondary | ICD-10-CM | POA: Diagnosis not present

## 2020-11-28 DIAGNOSIS — I5181 Takotsubo syndrome: Secondary | ICD-10-CM | POA: Diagnosis not present

## 2020-11-28 DIAGNOSIS — I252 Old myocardial infarction: Secondary | ICD-10-CM | POA: Diagnosis not present

## 2020-11-30 DIAGNOSIS — U071 COVID-19: Secondary | ICD-10-CM | POA: Diagnosis not present

## 2020-11-30 DIAGNOSIS — I10 Essential (primary) hypertension: Secondary | ICD-10-CM | POA: Diagnosis not present

## 2020-11-30 DIAGNOSIS — I252 Old myocardial infarction: Secondary | ICD-10-CM | POA: Diagnosis not present

## 2020-11-30 DIAGNOSIS — I2510 Atherosclerotic heart disease of native coronary artery without angina pectoris: Secondary | ICD-10-CM | POA: Diagnosis not present

## 2020-11-30 DIAGNOSIS — K8020 Calculus of gallbladder without cholecystitis without obstruction: Secondary | ICD-10-CM | POA: Diagnosis not present

## 2020-11-30 DIAGNOSIS — I5181 Takotsubo syndrome: Secondary | ICD-10-CM | POA: Diagnosis not present

## 2020-12-07 DIAGNOSIS — I2510 Atherosclerotic heart disease of native coronary artery without angina pectoris: Secondary | ICD-10-CM | POA: Diagnosis not present

## 2020-12-07 DIAGNOSIS — I252 Old myocardial infarction: Secondary | ICD-10-CM | POA: Diagnosis not present

## 2020-12-07 DIAGNOSIS — I10 Essential (primary) hypertension: Secondary | ICD-10-CM | POA: Diagnosis not present

## 2020-12-07 DIAGNOSIS — U071 COVID-19: Secondary | ICD-10-CM | POA: Diagnosis not present

## 2020-12-07 DIAGNOSIS — K8020 Calculus of gallbladder without cholecystitis without obstruction: Secondary | ICD-10-CM | POA: Diagnosis not present

## 2020-12-07 DIAGNOSIS — I5181 Takotsubo syndrome: Secondary | ICD-10-CM | POA: Diagnosis not present

## 2020-12-08 DIAGNOSIS — I10 Essential (primary) hypertension: Secondary | ICD-10-CM | POA: Diagnosis not present

## 2020-12-08 DIAGNOSIS — I252 Old myocardial infarction: Secondary | ICD-10-CM | POA: Diagnosis not present

## 2020-12-08 DIAGNOSIS — K8020 Calculus of gallbladder without cholecystitis without obstruction: Secondary | ICD-10-CM | POA: Diagnosis not present

## 2020-12-08 DIAGNOSIS — U071 COVID-19: Secondary | ICD-10-CM | POA: Diagnosis not present

## 2020-12-08 DIAGNOSIS — I2510 Atherosclerotic heart disease of native coronary artery without angina pectoris: Secondary | ICD-10-CM | POA: Diagnosis not present

## 2020-12-08 DIAGNOSIS — I5181 Takotsubo syndrome: Secondary | ICD-10-CM | POA: Diagnosis not present

## 2020-12-09 DIAGNOSIS — Z87891 Personal history of nicotine dependence: Secondary | ICD-10-CM | POA: Diagnosis not present

## 2020-12-09 DIAGNOSIS — I5181 Takotsubo syndrome: Secondary | ICD-10-CM | POA: Diagnosis not present

## 2020-12-09 DIAGNOSIS — E785 Hyperlipidemia, unspecified: Secondary | ICD-10-CM | POA: Diagnosis not present

## 2020-12-09 DIAGNOSIS — F32A Depression, unspecified: Secondary | ICD-10-CM | POA: Diagnosis not present

## 2020-12-09 DIAGNOSIS — N3281 Overactive bladder: Secondary | ICD-10-CM | POA: Diagnosis not present

## 2020-12-09 DIAGNOSIS — M549 Dorsalgia, unspecified: Secondary | ICD-10-CM | POA: Diagnosis not present

## 2020-12-09 DIAGNOSIS — Z9181 History of falling: Secondary | ICD-10-CM | POA: Diagnosis not present

## 2020-12-09 DIAGNOSIS — M8580 Other specified disorders of bone density and structure, unspecified site: Secondary | ICD-10-CM | POA: Diagnosis not present

## 2020-12-09 DIAGNOSIS — K219 Gastro-esophageal reflux disease without esophagitis: Secondary | ICD-10-CM | POA: Diagnosis not present

## 2020-12-09 DIAGNOSIS — I2510 Atherosclerotic heart disease of native coronary artery without angina pectoris: Secondary | ICD-10-CM | POA: Diagnosis not present

## 2020-12-09 DIAGNOSIS — U071 COVID-19: Secondary | ICD-10-CM | POA: Diagnosis not present

## 2020-12-09 DIAGNOSIS — K8020 Calculus of gallbladder without cholecystitis without obstruction: Secondary | ICD-10-CM | POA: Diagnosis not present

## 2020-12-09 DIAGNOSIS — H8110 Benign paroxysmal vertigo, unspecified ear: Secondary | ICD-10-CM | POA: Diagnosis not present

## 2020-12-09 DIAGNOSIS — M159 Polyosteoarthritis, unspecified: Secondary | ICD-10-CM | POA: Diagnosis not present

## 2020-12-09 DIAGNOSIS — Z7982 Long term (current) use of aspirin: Secondary | ICD-10-CM | POA: Diagnosis not present

## 2020-12-09 DIAGNOSIS — F419 Anxiety disorder, unspecified: Secondary | ICD-10-CM | POA: Diagnosis not present

## 2020-12-09 DIAGNOSIS — I252 Old myocardial infarction: Secondary | ICD-10-CM | POA: Diagnosis not present

## 2020-12-09 DIAGNOSIS — E559 Vitamin D deficiency, unspecified: Secondary | ICD-10-CM | POA: Diagnosis not present

## 2020-12-09 DIAGNOSIS — I10 Essential (primary) hypertension: Secondary | ICD-10-CM | POA: Diagnosis not present

## 2020-12-09 DIAGNOSIS — K5790 Diverticulosis of intestine, part unspecified, without perforation or abscess without bleeding: Secondary | ICD-10-CM | POA: Diagnosis not present

## 2020-12-12 DIAGNOSIS — K8020 Calculus of gallbladder without cholecystitis without obstruction: Secondary | ICD-10-CM | POA: Diagnosis not present

## 2020-12-12 DIAGNOSIS — I10 Essential (primary) hypertension: Secondary | ICD-10-CM | POA: Diagnosis not present

## 2020-12-12 DIAGNOSIS — I252 Old myocardial infarction: Secondary | ICD-10-CM | POA: Diagnosis not present

## 2020-12-12 DIAGNOSIS — U071 COVID-19: Secondary | ICD-10-CM | POA: Diagnosis not present

## 2020-12-12 DIAGNOSIS — I5181 Takotsubo syndrome: Secondary | ICD-10-CM | POA: Diagnosis not present

## 2020-12-12 DIAGNOSIS — I2510 Atherosclerotic heart disease of native coronary artery without angina pectoris: Secondary | ICD-10-CM | POA: Diagnosis not present

## 2020-12-13 DIAGNOSIS — I252 Old myocardial infarction: Secondary | ICD-10-CM | POA: Diagnosis not present

## 2020-12-13 DIAGNOSIS — I10 Essential (primary) hypertension: Secondary | ICD-10-CM | POA: Diagnosis not present

## 2020-12-13 DIAGNOSIS — I5181 Takotsubo syndrome: Secondary | ICD-10-CM | POA: Diagnosis not present

## 2020-12-13 DIAGNOSIS — I2510 Atherosclerotic heart disease of native coronary artery without angina pectoris: Secondary | ICD-10-CM | POA: Diagnosis not present

## 2020-12-13 DIAGNOSIS — K8020 Calculus of gallbladder without cholecystitis without obstruction: Secondary | ICD-10-CM | POA: Diagnosis not present

## 2020-12-13 DIAGNOSIS — U071 COVID-19: Secondary | ICD-10-CM | POA: Diagnosis not present

## 2020-12-18 DIAGNOSIS — I10 Essential (primary) hypertension: Secondary | ICD-10-CM | POA: Diagnosis not present

## 2020-12-18 DIAGNOSIS — I2510 Atherosclerotic heart disease of native coronary artery without angina pectoris: Secondary | ICD-10-CM | POA: Diagnosis not present

## 2020-12-18 DIAGNOSIS — U071 COVID-19: Secondary | ICD-10-CM | POA: Diagnosis not present

## 2020-12-18 DIAGNOSIS — I5181 Takotsubo syndrome: Secondary | ICD-10-CM | POA: Diagnosis not present

## 2020-12-18 DIAGNOSIS — K8020 Calculus of gallbladder without cholecystitis without obstruction: Secondary | ICD-10-CM | POA: Diagnosis not present

## 2020-12-18 DIAGNOSIS — I252 Old myocardial infarction: Secondary | ICD-10-CM | POA: Diagnosis not present

## 2020-12-21 DIAGNOSIS — K8020 Calculus of gallbladder without cholecystitis without obstruction: Secondary | ICD-10-CM | POA: Diagnosis not present

## 2020-12-21 DIAGNOSIS — I2510 Atherosclerotic heart disease of native coronary artery without angina pectoris: Secondary | ICD-10-CM | POA: Diagnosis not present

## 2020-12-21 DIAGNOSIS — U071 COVID-19: Secondary | ICD-10-CM | POA: Diagnosis not present

## 2020-12-21 DIAGNOSIS — I10 Essential (primary) hypertension: Secondary | ICD-10-CM | POA: Diagnosis not present

## 2020-12-21 DIAGNOSIS — I252 Old myocardial infarction: Secondary | ICD-10-CM | POA: Diagnosis not present

## 2020-12-21 DIAGNOSIS — I5181 Takotsubo syndrome: Secondary | ICD-10-CM | POA: Diagnosis not present

## 2020-12-26 DIAGNOSIS — I5181 Takotsubo syndrome: Secondary | ICD-10-CM | POA: Diagnosis not present

## 2020-12-26 DIAGNOSIS — M5416 Radiculopathy, lumbar region: Secondary | ICD-10-CM | POA: Diagnosis not present

## 2020-12-26 DIAGNOSIS — R609 Edema, unspecified: Secondary | ICD-10-CM | POA: Diagnosis not present

## 2021-01-04 ENCOUNTER — Other Ambulatory Visit: Payer: Self-pay | Admitting: Neurology

## 2021-01-23 ENCOUNTER — Ambulatory Visit (HOSPITAL_COMMUNITY): Payer: Medicare Other | Attending: Cardiology

## 2021-01-23 ENCOUNTER — Other Ambulatory Visit: Payer: Self-pay

## 2021-01-23 DIAGNOSIS — I5181 Takotsubo syndrome: Secondary | ICD-10-CM | POA: Diagnosis not present

## 2021-01-23 LAB — ECHOCARDIOGRAM COMPLETE
AR max vel: 1.77 cm2
AV Area VTI: 1.98 cm2
AV Area mean vel: 2.03 cm2
AV Mean grad: 9.5 mmHg
AV Peak grad: 21.1 mmHg
Ao pk vel: 2.3 m/s
Area-P 1/2: 2.5 cm2
S' Lateral: 2.8 cm

## 2021-01-24 ENCOUNTER — Ambulatory Visit (INDEPENDENT_AMBULATORY_CARE_PROVIDER_SITE_OTHER): Payer: Medicare Other | Admitting: Cardiovascular Disease

## 2021-01-24 ENCOUNTER — Encounter: Payer: Self-pay | Admitting: Cardiovascular Disease

## 2021-01-24 VITALS — BP 130/74 | HR 70 | Ht 64.0 in | Wt 159.4 lb

## 2021-01-24 DIAGNOSIS — Z01818 Encounter for other preprocedural examination: Secondary | ICD-10-CM | POA: Diagnosis not present

## 2021-01-24 DIAGNOSIS — I5181 Takotsubo syndrome: Secondary | ICD-10-CM | POA: Diagnosis not present

## 2021-01-24 DIAGNOSIS — I10 Essential (primary) hypertension: Secondary | ICD-10-CM

## 2021-01-24 NOTE — Patient Instructions (Signed)
Medication Instructions:  1) STOP LOSARTAN *If you need a refill on your cardiac medications before your next appointment, please call your pharmacy*  Follow-Up: At [facility], you and your health needs are our priority.  As part of our continuing mission to provide you with exceptional heart care, we have created designated Provider Care Teams.  These Care Teams include your primary Cardiologist (physician) and Advanced Practice Providers (APPs -  Physician Assistants and Nurse Practitioners) who all work together to provide you with the care you need, when you need it. Your next appointment:   12 month(s) The format for your next appointment:   In Person Provider:   You may see Sherren Mocha, MD or one of the following Advanced Practice Providers on your designated Care Team:    Richardson Dopp, PA-C  [place]in [place], [place]

## 2021-01-24 NOTE — Progress Notes (Signed)
Cardiology Office Note:    Date:  01/24/2021   ID:  Colleen Glenn, Colleen Glenn 1941-02-22, MRN [id number]  PCP:  Crist Infante, [place]  Cardiologist:  Sherren Mocha, MD  Advanced Practice Provider:  No care team member to display Electrophysiologist:  None       Referring MD: No ref. provider found   Chief Complaint  Patient presents with  . Follow-up    Takotsubo's Cardiomyopathy    History of Present Illness:    Colleen Glenn is a 80 y.o. female with a hx of Takotsubo syndrome in December 2021.  She underwent emergent cardiac catheterization as her EKG and clinical presentation was consistent with a code STEMI.  Cardiac cath showed nonobstructive coronary artery disease and LV function demonstrated typical findings of acute Takotsubo syndrome with periapical ballooning.  LVEF was in the range of 35 to 40%.  She had no other acute complications and presents today for follow-up evaluation.  An echocardiogram completed yesterday shows normalization of her LV function with complete recovery.  The patient is here with her daughter today.  She continues to have a lot of problems with her right knee and is pending knee replacement surgery by Dr. Percell Miller.  She denies any chest pain, shortness of breath, or heart palpitations.  Past Medical History:  Diagnosis Date  . Anxiety    on meds  . Arthritis    OA bil shoulders, hands, back  . Back pain   . BPPV (benign paroxysmal positional vertigo) 10/2019  . Bursitis   . Colon polyps    adenomatous  . Colon polyps   . Depression    on meds  . Diverticulosis   . GERD (gastroesophageal reflux disease)    on meds  . Hemorrhoids   . Hyperlipidemia    on meds  . Mild hypertension    white coat syndrome- on meds  . OAB (overactive bladder)   . Osteopenia   . Panic disorder 11/2012  . Plantar fasciitis    hx of  . PUD (peptic ulcer disease)   . Shingles   . Tremor    mild action tremor suspected  2020  . Vitamin D deficiency    on meds    Past Surgical History:  Procedure Laterality Date  . ABDOMINAL HYSTERECTOMY    . APPENDECTOMY    . BACK SURGERY    . COLONOSCOPY  2016   JP-MAC-TA  . FOOT SURGERY     bilateral   . HEMORRHOID SURGERY    . JOINT REPLACEMENT Right 2014  . laproscopic knee surgery Right    2018  . LEFT HEART CATH AND CORONARY ANGIOGRAPHY N/A 09/28/2020   Procedure: LEFT HEART CATH AND CORONARY ANGIOGRAPHY;  Surgeon: Sherren Mocha, MD;  Location: [facility];  Service: Cardiovascular;  Laterality: N/A;  . LUMBAR LAMINECTOMY/DECOMPRESSION MICRODISCECTOMY  05/07/2012   Procedure: LUMBAR LAMINECTOMY/DECOMPRESSION MICRODISCECTOMY 1 LEVEL;  Surgeon: Eustace Moore, MD;  Location: [facility];  Service: Neurosurgery;  Laterality: Right;  Lumbar Laminectomy Decompression Microdiscectomy Lumbar Three-Four  . multiple foot surgeries    . POLYPECTOMY  2016   TA  . REVERSE SHOULDER ARTHROPLASTY Left 07/12/2020   Procedure: REVERSE SHOULDER ARTHROPLASTY;  Surgeon: Hiram Gash, MD;  Location: [facility];  Service: Orthopedics;  Laterality: Left;  . ROTATOR CUFF REPAIR     right side  . TONSILLECTOMY    . TOTAL HIP ARTHROPLASTY  11/16/2012  Procedure: TOTAL HIP ARTHROPLASTY;  Surgeon: Gearlean Alf, MD;  Location: [facility];  Service: Orthopedics;  Laterality: Right;  Right Total Hip Arthroplasty  . TUBAL LIGATION    . VAGOTOMY     approx. 35 years ago, states has a clamp mid epigastric area    Current Medications: Current Meds  Medication Sig  . acetaminophen (TYLENOL) 325 MG tablet Take 325-650 mg by mouth every 6 (six) hours as needed for mild pain.  Marland Kitchen aspirin EC 81 MG tablet Take 81 mg by mouth at bedtime. Swallow whole.  Marland Kitchen buPROPion (WELLBUTRIN XL) 300 MG 24 hr tablet Take 300 mg by mouth daily before breakfast.   . carvedilol (COREG) 12.5 MG tablet Take 1 tablet (12.5 mg total) by mouth 2 (two) times daily with a meal.  . Cholecalciferol  (VITAMIN D-3) 125 MCG (5000 UT) TABS Take 5,000 Units by mouth daily after breakfast.  . cyclobenzaprine (FLEXERIL) 5 MG tablet Take 5 mg by mouth 3 (three) times daily as needed for muscle spasms.  Marland Kitchen donepezil (ARICEPT) 10 MG tablet Take 10 mg by mouth at bedtime.  . DULoxetine (CYMBALTA) 60 MG capsule Take 60 mg by mouth daily.  Marland Kitchen ezetimibe (ZETIA) 10 MG tablet Take 10 mg by mouth daily.  Marland Kitchen gabapentin (NEURONTIN) 300 MG capsule TAKE 1 CAPSULE(300 MG) BY MOUTH AT BEDTIME  . MYRBETRIQ 50 MG TB24 tablet Take 50 mg by mouth daily.  . naproxen sodium (ALEVE) 220 MG tablet Take 220-440 mg by mouth 2 (two) times daily as needed (for arthritic pain).  . ondansetron (ZOFRAN-ODT) 8 MG disintegrating tablet Take 8 mg by mouth every 8 (eight) hours as needed for nausea/vomiting (DISSOLVE ORALLY).  Marland Kitchen pantoprazole (PROTONIX) 40 MG tablet TAKE 1 TABLET (40 MG TOTAL) BY MOUTH DAILY AT 6AM.  . polyethylene glycol powder (GLYCOLAX/MIRALAX) 17 GM/SCOOP powder Take 17 g by mouth daily.  . promethazine (PHENERGAN) 12.5 MG tablet TAKE 1 TABLET (12.5 MG TOTAL) BY MOUTH EVERY SIX HOURS AS NEEDED FOR NAUSEA OR VOMITING.  . raloxifene (EVISTA) 60 MG tablet Take 60 mg by mouth daily.  . rosuvastatin (CRESTOR) 20 MG tablet TAKE 1 TABLET (20 MG TOTAL) BY MOUTH DAILY.  . vitamin B-12 (CYANOCOBALAMIN) 1000 MCG tablet Take 1,000 mcg by mouth daily.  . [DISCONTINUED] losartan (COZAAR) 25 MG tablet TAKE 1/2 TABLET (12.5 MG TOTAL) BY MOUTH DAILY.     Allergies:   Patient has no known allergies.   Social History   Socioeconomic History  . Marital status: Widowed    Spouse name: Not on file  . Number of children: 3  . Years of education: cosmetology degree  . Highest education level: GED or equivalent  Occupational History  . Occupation: Retired  Tobacco Use  . Smoking status: Former Smoker    Packs/day: 1.00    Years: 20.00    Pack years: 20.00    Types: Cigarettes    Quit date: 1983    Years since quitting: 39.2   . Smokeless tobacco: Never Used  . Tobacco comment: 45 years ago  Vaping Use  . Vaping Use: Never used  Substance and Sexual Activity  . Alcohol use: Yes    Alcohol/week: 2.0 - 4.0 standard drinks    Types: 2 - 4 Glasses of wine per week    Comment: every 3-4 days  . Drug use: No  . Sexual activity: Not Currently    Partners: Male  Other Topics Concern  . Not on file  Social History  Narrative   Lives alone   Right handed   Social Determinants of Health   Financial Resource Strain: Not on file  Food Insecurity: Not on file  Transportation Needs: Not on file  Physical Activity: Not on file  Stress: Not on file  Social Connections: Not on file     Family History: The patient's family history includes Colon polyps in her mother; Congestive Heart Failure in her mother; Heart disease in her father; Hypertension in her mother; Lung cancer (age of onset: 68) in her father; Stroke in her mother and another family member. There is no history of Colon cancer, Rectal cancer, Stomach cancer, or Esophageal cancer.  ROS:   Please see the history of present illness.    All other systems reviewed and are negative.  EKGs/Labs/Other Studies Reviewed:    The following studies were reviewed today: Cardiac Cath 09/28/2020: Conclusion  1.  Severe segmental LV dysfunction, with classic pattern of acute Takotsubo syndrome (LV apical ballooning) 2.  Nonobstructive coronary artery disease with moderate mid LAD stenosis, mild proximal RCA stenosis, and no evidence of high-grade obstruction 3.  Mildly elevated LVEDP  Recommendations: Medical therapy, supportive care.  Anticipate 48-hour hospitalization as long as no early complications arise.  Repeat echocardiogram in about 3 months to assess for LV recovery.  Treat with a beta-blocker as tolerated.  Diagnostic Dominance: Right  Left Main  The vessel exhibits minimal luminal irregularities.  Left Anterior Descending  There is mild diffuse  disease throughout the vessel.  Mid LAD lesion is 50% stenosed. The proximal LAD has mild ectasia. There is a moderate stenosis in the mid LAD without high-grade obstruction. The LAD reaches the LV apex.  Left Circumflex  The vessel exhibits minimal luminal irregularities.  Right Coronary Artery  There is mild diffuse disease throughout the vessel. The vessel is moderately ectatic. Large, dominant vessel with no obstructive disease. There is ectasia in the proximal and mid vessel.  Prox RCA lesion is 40% stenosed.  Right Posterior Descending Artery  The vessel exhibits minimal luminal irregularities.  First Right Posterolateral Branch  The vessel exhibits minimal luminal irregularities.    Echo 01/23/2021: IMPRESSIONS    1. Left ventricular ejection fraction, by estimation, is 70 to 75%. The  left ventricle has hyperdynamic function. The left ventricle has no  regional wall motion abnormalities. There is moderate left ventricular  hypertrophy. Left ventricular diastolic  function could not be evaluated.  2. Right ventricular systolic function is normal. The right ventricular  size is normal. There is mildly elevated pulmonary artery systolic  pressure. The estimated right ventricular systolic pressure is 81.0 mmHg.  3. Left atrial size was mildly dilated.  4. The mitral valve is normal in structure. No evidence of mitral valve  regurgitation. No evidence of mitral stenosis.  5. The aortic valve is tricuspid. There is mild calcification of the  aortic valve. There is mild thickening of the aortic valve. Aortic valve  regurgitation is trivial. Mild aortic valve stenosis. Aortic valve area,  by VTI measures 1.98 cm. Aortic valve  mean gradient measures 9.5 mmHg. Aortic valve Vmax measures 2.30 m/s.  6. The inferior vena cava is normal in size with greater than 50%  respiratory variability, suggesting right atrial pressure of 3 mmHg.   Comparison(s): The left ventricular  function has improved.   EKG:  EKG is not ordered today.   Recent Labs: 10/01/2020: ALT 17; TSH 3.148 10/02/2020: Hemoglobin 12.5; Magnesium 2.1; Platelets 221 10/25/2020: BUN 12; Creatinine,  Ser 0.80; Potassium 4.4; Sodium 143  Recent Lipid Panel    Component Value Date/Time   CHOL 188 09/28/2020 1831   TRIG 128 09/28/2020 1831   HDL 44 09/28/2020 1831   CHOLHDL 4.3 09/28/2020 1831   VLDL 26 09/28/2020 1831   LDLCALC 118 (H) 09/28/2020 1831     Risk Assessment/Calculations:       Physical Exam:    VS:  BP 130/74   Pulse 70   Ht _0  (1.626 m)   Wt 159 lb 6.4 oz (72.3 kg)   SpO2 95%   BMI 27.36 kg/m     Wt Readings from Last 3 Encounters:  01/24/21 159 lb 6.4 oz (72.3 kg)  10/25/20 148 lb (67.1 kg)  09/30/20 141 lb 12.8 oz (64.3 kg)     GEN:  Well nourished, well developed in no acute distress HEENT: Normal NECK: No JVD; No carotid bruits LYMPHATICS: No lymphadenopathy CARDIAC: RRR, no murmurs, rubs, gallops RESPIRATORY:  Clear to auscultation without rales, wheezing or rhonchi  ABDOMEN: Soft, non-tender, non-distended MUSCULOSKELETAL:  No edema; No deformity  SKIN: Warm and dry NEUROLOGIC:  Alert and oriented x 3 PSYCHIATRIC:  Normal affect   ASSESSMENT:    1. Takotsubo syndrome   2. Essential hypertension   3. Pre-operative clearance    PLAN:    In order of problems listed above:  1. The patient's echocardiogram is reviewed and shows vigorous LV systolic function.  She has had complete recovery from acute Takotsubo syndrome about 4 months ago.  I do not think she will benefit from staying on the low-dose of losartan that she has been taking which is almost a homeopathic dose of 12.5 mg daily.  I recommended that she discontinue this.  However, she should stay on carvedilol 12.5 mg twice daily.  This may help prevent recurrence of Takotsubo's and certainly has played a role in recovery of LV function.  We discussed stress management issues and I would  like to see her back in 1 year for clinical follow-up. 2. Blood pressure controlled.  Discontinue losartan.  Continue carvedilol 12.5 mg twice daily. 3. The patient will require knee replacement surgery by Dr. Percell Miller.  She has fully recovered from her cardiac event in January.  She did not have any high-grade obstructive coronary artery disease.  She can proceed at low risk of cardiac complications.   Medication Adjustments/Labs and Tests Ordered: Current medicines are reviewed at length with the patient today.  Concerns regarding medicines are outlined above.  No orders of the defined types were placed in this encounter.  No orders of the defined types were placed in this encounter.   Patient Instructions  Medication Instructions:  1) STOP LOSARTAN *If you need a refill on your cardiac medications before your next appointment, please call your pharmacy*  Follow-Up: At [facility], you and your health needs are our priority.  As part of our continuing mission to provide you with exceptional heart care, we have created designated Provider Care Teams.  These Care Teams include your primary Cardiologist (physician) and Advanced Practice Providers (APPs -  Physician Assistants and Nurse Practitioners) who all work together to provide you with the care you need, when you need it. Your next appointment:   12 month(s) The format for your next appointment:   In Person Provider:   You may see Sherren Mocha, MD or one of the following Advanced Practice Providers on your designated Care Team:    Richardson Dopp, [place]  Robbie Lis, PA-C      Signed, Sherren Mocha, MD  01/24/2021 11:42 AM    [place]

## 2021-01-26 DIAGNOSIS — M25612 Stiffness of left shoulder, not elsewhere classified: Secondary | ICD-10-CM | POA: Diagnosis not present

## 2021-01-26 DIAGNOSIS — M25512 Pain in left shoulder: Secondary | ICD-10-CM | POA: Diagnosis not present

## 2021-01-26 DIAGNOSIS — M6281 Muscle weakness (generalized): Secondary | ICD-10-CM | POA: Diagnosis not present

## 2021-02-07 DIAGNOSIS — R791 Abnormal coagulation profile: Secondary | ICD-10-CM | POA: Diagnosis not present

## 2021-02-07 DIAGNOSIS — Z01812 Encounter for preprocedural laboratory examination: Secondary | ICD-10-CM | POA: Diagnosis not present

## 2021-02-07 DIAGNOSIS — M25561 Pain in right knee: Secondary | ICD-10-CM | POA: Diagnosis not present

## 2021-02-12 DIAGNOSIS — M5136 Other intervertebral disc degeneration, lumbar region: Secondary | ICD-10-CM | POA: Diagnosis not present

## 2021-02-12 DIAGNOSIS — M8580 Other specified disorders of bone density and structure, unspecified site: Secondary | ICD-10-CM | POA: Diagnosis not present

## 2021-02-12 DIAGNOSIS — I5181 Takotsubo syndrome: Secondary | ICD-10-CM | POA: Diagnosis not present

## 2021-02-12 DIAGNOSIS — M25569 Pain in unspecified knee: Secondary | ICD-10-CM | POA: Diagnosis not present

## 2021-02-12 DIAGNOSIS — R413 Other amnesia: Secondary | ICD-10-CM | POA: Diagnosis not present

## 2021-02-12 DIAGNOSIS — E538 Deficiency of other specified B group vitamins: Secondary | ICD-10-CM | POA: Diagnosis not present

## 2021-02-12 DIAGNOSIS — M5416 Radiculopathy, lumbar region: Secondary | ICD-10-CM | POA: Diagnosis not present

## 2021-02-12 DIAGNOSIS — R609 Edema, unspecified: Secondary | ICD-10-CM | POA: Diagnosis not present

## 2021-02-12 DIAGNOSIS — R296 Repeated falls: Secondary | ICD-10-CM | POA: Diagnosis not present

## 2021-02-12 DIAGNOSIS — E785 Hyperlipidemia, unspecified: Secondary | ICD-10-CM | POA: Diagnosis not present

## 2021-02-12 DIAGNOSIS — K8020 Calculus of gallbladder without cholecystitis without obstruction: Secondary | ICD-10-CM | POA: Diagnosis not present

## 2021-02-12 DIAGNOSIS — I10 Essential (primary) hypertension: Secondary | ICD-10-CM | POA: Diagnosis not present

## 2021-02-28 DIAGNOSIS — M25561 Pain in right knee: Secondary | ICD-10-CM | POA: Diagnosis not present

## 2021-03-02 DIAGNOSIS — Z23 Encounter for immunization: Secondary | ICD-10-CM | POA: Diagnosis not present

## 2021-03-03 DIAGNOSIS — R296 Repeated falls: Secondary | ICD-10-CM

## 2021-03-03 HISTORY — DX: Repeated falls: R29.6

## 2021-03-07 NOTE — Patient Instructions (Addendum)
DUE TO COVID-19 ONLY ONE VISITOR IS ALLOWED TO COME WITH YOU AND STAY IN THE WAITING ROOM ONLY DURING PRE OP AND PROCEDURE DAY OF SURGERY. THE 2 VISITORS  MAY VISIT WITH YOU AFTER SURGERY IN YOUR PRIVATE ROOM DURING VISITING HOURS ONLY!  YOU NEED TO HAVE A COVID 19 TEST ON_5/27______ @_2 :15 pm______, THIS TEST MUST BE DONE BEFORE SURGERY,  COVID TESTING SITE [place] [id number], IT IS ON THE RIGHT GOING OUT [address] APPROXIMATELY  2 MINUTES PAST [employer] ON THE RIGHT. ONCE YOUR COVID TEST IS COMPLETED,  PLEASE BEGIN THE QUARANTINE INSTRUCTIONS AS OUTLINED IN YOUR HANDOUT.                Colleen Glenn    Your procedure is scheduled on: 03/20/21   Report to [facility] Main  Entrance   Report to short stay at 5:15 AM     Call this number if you have problems the morning of surgery [place], NO [place].  No food after midnight.    You may have clear liquid until 4:30 AM.    At 4:00 AM drink pre surgery drink.   Nothing by mouth after 4:30 AM.    Take these medicines the morning of surgery with A SIP OF WATER:  Wellbutrin, Cymbalta, Carvedilol, Aricept, Evista, Myrbetriq                                 You may not have any metal on your body including hair pins and              piercings  Do not wear jewelry, make-up, lotions, powders or perfumes, deodorant             Do not wear nail polish on your fingernails.  Do not shave  48 hours prior to surgery.                 Do not bring valuables to the hospital. [place].  Contacts, dentures or bridgework may not be worn into surgery.                 Please read over the following fact sheets you were given: _____________________________________________________________________             [facility] - Preparing for Surgery Before  surgery, you can play an important role.  Because skin is not sterile, your skin needs to be as free of germs as possible.  You can reduce the number of germs on your skin by washing with CHG (chlorahexidine gluconate) soap before surgery.  CHG is an antiseptic cleaner which kills germs and bonds with the skin to continue killing germs even after washing. Please DO NOT use if you have an allergy to CHG or antibacterial soaps.  If your skin becomes reddened/irritated stop using the CHG and inform your nurse when you arrive at Short Stay. Do not shave (including legs and underarms) for at least 48 hours prior to the first CHG shower.    Please follow these instructions carefully:  1.  Shower with CHG Soap the night before surgery and the  morning of Surgery.  2.  If you choose  to wash your hair, wash your hair first as usual with your  normal  shampoo.  3.  After you shampoo, rinse your hair and body thoroughly to remove the  shampoo.                                        4.  Use CHG as you would any other liquid soap.  You can apply chg directly  to the skin and wash                       Gently with a scrungie or clean washcloth.  5.  Apply the CHG Soap to your body ONLY FROM THE NECK DOWN.   Do not use on face/ open                           Wound or open sores. Avoid contact with eyes, ears mouth and genitals (private parts).                       Wash face,  Genitals (private parts) with your normal soap.             6.  Wash thoroughly, paying special attention to the area where your surgery  will be performed.  7.  Thoroughly rinse your body with warm water from the neck down.  8.  DO NOT shower/wash with your normal soap after using and rinsing off  the CHG Soap.             9.  Pat yourself dry with a clean towel.            10.  Wear clean pajamas.            11.  Place clean sheets on your bed the night of your first shower and do not  sleep with pets. Day of Surgery : Do not apply any  lotions/deodorants the morning of surgery.  Please wear clean clothes to the hospital/surgery center.  FAILURE TO FOLLOW THESE INSTRUCTIONS MAY RESULT IN THE CANCELLATION OF YOUR SURGERY PATIENT SIGNATURE_________________________________  NURSE SIGNATURE__________________________________  ________________________________________________________________________   Colleen Glenn  An incentive spirometer is a tool that can help keep your lungs clear and active. This tool measures how well you are filling your lungs with each breath. Taking long deep breaths may help reverse or decrease the chance of developing breathing (pulmonary) problems (especially infection) following:  A long period of time when you are unable to move or be active. BEFORE THE PROCEDURE   If the spirometer includes an indicator to show your best effort, your nurse or respiratory therapist will set it to a desired goal.  If possible, sit up straight or lean slightly forward. Try not to slouch.  Hold the incentive spirometer in an upright position. INSTRUCTIONS FOR USE  1. Sit on the edge of your bed if possible, or sit up as far as you can in bed or on a chair. 2. Hold the incentive spirometer in an upright position. 3. Breathe out normally. 4. Place the mouthpiece in your mouth and seal your lips tightly around it. 5. Breathe in slowly and as deeply as possible, raising the piston or the ball toward the top of the column. 6. Hold your breath for 3-5 seconds or for as long as  possible. Allow the piston or ball to fall to the bottom of the column. 7. Remove the mouthpiece from your mouth and breathe out normally. 8. Rest for a few seconds and repeat Steps 1 through 7 at least 10 times every 1-2 hours when you are awake. Take your time and take a few normal breaths between deep breaths. 9. The spirometer may include an indicator to show your best effort. Use the indicator as a goal to work toward during each  repetition. 10. After each set of 10 deep breaths, practice coughing to be sure your lungs are clear. If you have an incision (the cut made at the time of surgery), support your incision when coughing by placing a pillow or rolled up towels firmly against it. Once you are able to get out of bed, walk around indoors and cough well. You may stop using the incentive spirometer when instructed by your caregiver.  RISKS AND COMPLICATIONS  Take your time so you do not get dizzy or light-headed.  If you are in pain, you may need to take or ask for pain medication before doing incentive spirometry. It is harder to take a deep breath if you are having pain. AFTER USE  Rest and breathe slowly and easily.  It can be helpful to keep track of a log of your progress. Your caregiver can provide you with a simple table to help with this. If you are using the spirometer at home, follow these instructions: [place] IF:   You are having difficultly using the spirometer.  You have trouble using the spirometer as often as instructed.  Your pain medication is not giving enough relief while using the spirometer.  You develop fever of 100.5 F (38.1 C) or higher. SEEK IMMEDIATE MEDICAL CARE IF:   You cough up bloody sputum that had not been present before.  You develop fever of 102 F (38.9 C) or greater.  You develop worsening pain at or near the incision site. MAKE SURE YOU:   Understand these instructions.  Will watch your condition.  Will get help right away if you are not doing well or get worse. Document Released: 02/17/2007 Document Revised: 12/30/2011 Document Reviewed: 04/20/2007 [facility] Patient Information 2014 [place], [place].   ________________________________________________________________________

## 2021-03-08 ENCOUNTER — Encounter (HOSPITAL_COMMUNITY): Payer: Self-pay

## 2021-03-08 ENCOUNTER — Encounter (HOSPITAL_COMMUNITY)
Admission: RE | Admit: 2021-03-08 | Discharge: 2021-03-08 | Disposition: A | Discharge status: Home / Self Care | Payer: Medicare Other | Source: Ambulatory Visit | Attending: Orthopedic Surgery | Admitting: Orthopedic Surgery

## 2021-03-08 ENCOUNTER — Other Ambulatory Visit: Payer: Self-pay

## 2021-03-08 DIAGNOSIS — Z01812 Encounter for preprocedural laboratory examination: Secondary | ICD-10-CM | POA: Insufficient documentation

## 2021-03-08 HISTORY — DX: Cerebral infarction, unspecified: I63.9

## 2021-03-08 HISTORY — DX: Takotsubo syndrome: I51.81

## 2021-03-08 HISTORY — DX: Acute myocardial infarction, unspecified: I21.9

## 2021-03-08 LAB — CBC
HCT: 39.3 % (ref 36.0–46.0)
Hemoglobin: 12.1 g/dL (ref 12.0–15.0)
MCH: 30.7 pg (ref 26.0–34.0)
MCHC: 30.8 g/dL (ref 30.0–36.0)
MCV: 99.7 fL (ref 80.0–100.0)
Platelets: 209 10*3/uL (ref 150–400)
RBC: 3.94 MIL/uL (ref 3.87–5.11)
RDW: 14.5 % (ref 11.5–15.5)
WBC: 5 10*3/uL (ref 4.0–10.5)
nRBC: 0 % (ref 0.0–0.2)

## 2021-03-08 LAB — BASIC METABOLIC PANEL
Anion gap: 4 — ABNORMAL LOW (ref 5–15)
BUN: 24 mg/dL — ABNORMAL HIGH (ref 8–23)
CO2: 31 mmol/L (ref 22–32)
Calcium: 9 mg/dL (ref 8.9–10.3)
Chloride: 108 mmol/L (ref 98–111)
Creatinine, Ser: 0.72 mg/dL (ref 0.44–1.00)
GFR, Estimated: >60 mL/min (ref >60)
Glucose, Bld: 100 mg/dL — ABNORMAL HIGH (ref 70–99)
Potassium: 4.3 mmol/L (ref 3.5–5.1)
Sodium: 143 mmol/L (ref 135–145)

## 2021-03-08 LAB — SURGICAL PCR SCREEN
MRSA, PCR: NEGATIVE
Staphylococcus aureus: NEGATIVE

## 2021-03-08 NOTE — Progress Notes (Signed)
COVID Vaccine Completed:Yes Date COVID Vaccine completed:12/22/19-booster 03/02/21(Moderna) COVID vaccine manufacturer: Pfizer     PCP - Dr. Jerilynn Mages. [place] Cardiologist - Dr. Ezzie Dural  Chest x-ray - 10/18/20-epic EKG - 09/30/20-epic Stress Test - no ECHO - 01/23/21-epic Cardiac Cath - 09/28/20-epic Pacemaker/ICD device last checked:NA  Sleep Study - no CPAP -   Fasting Blood Sugar - NA Checks Blood Sugar _____ times a day  Blood Thinner Instructions:ASA 81/ Dr.  Burt Knack Aspirin Instructions:Stop 7 days prior to DOS/ Dr. Percell Miller Last Dose:Pt stopped taking about 3 weeks ago maybe 02/17/21  Anesthesia review:   Patient denies shortness of breath, fever, cough and chest pain at PAT appointment Yes. Pt is using a walker at home. She had a unwitnessed fall at home 03/03/21. Daughter saw her on 03/05/21 with bruising on her face but no C/O pain. Pt doesn't remember if she tripped or passes out. Daughter reports the she has always fallen easily through her life. Janett Billow the PA was called to the office at the PAT visit to assess the Pt.  She told the Daughter to call Dr. Joylene Draft to see her prior to surgery.   She had a TIA 09/10/20. and a Takotsubo cardiomyopathy.  Patient verbalized understanding of instructions that were given to them at the PAT appointment. Patient was also instructed that they will need to review over the PAT instructions again at home before surgery.Yes

## 2021-03-12 ENCOUNTER — Encounter (HOSPITAL_COMMUNITY): Payer: Self-pay

## 2021-03-12 NOTE — Progress Notes (Signed)
Anesthesia Chart Review:   Case: [id number] Date/Time: 03/20/21 0715   Procedure: TOTAL KNEE ARTHROPLASTY (Right Knee)   Anesthesia type: Spinal   Pre-op diagnosis: OA RIGHT KNEE   Location: [facility] [place] [facility]   Surgeons: Renette Butters, MD      DISCUSSION: Pt is 80 years old with hx NSTEMI with Takotsubo cardiomyopathy (09/2020; LV function recovered on 01/23/21 echo), CAD (mild-mod nonosbtructive on 09/28/20 cath), stroke, HTN  Hospitalized 12/9-14/21 for NSTEMI/Takotsubo cardiomyopathy.   VS: BP (!) 145/51   Pulse 64   Temp 36.7 C (Oral)   Resp 18   Ht _0  (1.651 m)   Wt 70.5 kg   SpO2 100%   BMI 25.88 kg/m    PROVIDERS: - PCP is Crist Infante, MD - Cardiologist is Sherren Mocha, MD who cleared pt at low risk for surgery at last office visit 01/24/21   LABS: Labs reviewed: Acceptable for surgery. (all labs ordered are listed, but only abnormal results are displayed)  Labs Reviewed  BASIC METABOLIC PANEL - Abnormal; Notable for the following components:      Result Value   Glucose, Bld 100 (*)    BUN 24 (*)    Anion gap 4 (*)    All other components within normal limits  SURGICAL PCR SCREEN  CBC     IMAGES: 1 view CXR 09/28/20: No focal airspace disease   EKG 09/30/20 (done in the setting of chest pain in ED - found to have Takotsubo cardiomyopathy): Sinus rhythm with PACs. Anterior infarct , possibly acute/ increased ST elevation   CV: Echo 01/23/21:  1. Left ventricular ejection fraction, by estimation, is 70 to 75%. The  left ventricle has hyperdynamic function. The left ventricle has no regional wall motion abnormalities. There is moderate left ventricular hypertrophy. Left ventricular diastolic function could not be evaluated.  2. Right ventricular systolic function is normal. The right ventricular size is normal. There is mildly elevated pulmonary artery systolic pressure. The estimated right ventricular systolic pressure is 93.7 mmHg.  3. Left atrial  size was mildly dilated.  4. The mitral valve is normal in structure. No evidence of mitral valve regurgitation. No evidence of mitral stenosis.  5. The aortic valve is tricuspid. There is mild calcification of the aortic valve. There is mild thickening of the aortic valve. Aortic valve regurgitation is trivial. Mild aortic valve stenosis. Aortic valve area, by VTI measures 1.98 cm. Aortic valve  mean gradient measures 9.5 mmHg. Aortic valve Vmax measures 2.79ms.  6. The inferior vena cava is normal in size with greater than 50% respiratory variability, suggesting right atrial pressure of 3 mmHg.  - Comparison(s): The left ventricular function has improved.  Cardiac cath 09/28/20:  1.  Severe segmental LV dysfunction, with classic pattern of acute Takotsubo syndrome (LV apical ballooning) 2.  Nonobstructive coronary artery disease with moderate mid LAD stenosis, mild proximal RCA stenosis, and no evidence of high-grade obstruction 3.  Mildly elevated LVEDP - Recommendations: Medical therapy, supportive care.    Past Medical History:  Diagnosis Date  . Anxiety    on meds  . Arthritis    OA bil shoulders, hands, back  . Back pain   . BPPV (benign paroxysmal positional vertigo) 10/2019  . Bursitis   . Colon polyps    adenomatous  . Colon polyps   . Depression    on meds  . Diverticulosis   . Frequent falls 03/03/2021  . GERD (gastroesophageal reflux disease)  on meds  . Hemorrhoids   . Hyperlipidemia    on meds  . Mild hypertension    white coat syndrome- on meds  . Myocardial infarction ([place])   . OAB (overactive bladder)   . Osteopenia   . Panic disorder 11/2012  . Plantar fasciitis    hx of  . PUD (peptic ulcer disease)   . Shingles   . Stroke ([place])   . Takotsubo cardiomyopathy   . Tremor    mild action tremor suspected 2020  . Vitamin D deficiency    on meds    Past Surgical History:  Procedure Laterality Date  . ABDOMINAL HYSTERECTOMY    . APPENDECTOMY     . BACK SURGERY    . COLONOSCOPY  2016   JP-MAC-TA  . FOOT SURGERY     bilateral   . HEMORRHOID SURGERY    . JOINT REPLACEMENT Right 2014  . laproscopic knee surgery Right    2018  . LEFT HEART CATH AND CORONARY ANGIOGRAPHY N/A 09/28/2020   Procedure: LEFT HEART CATH AND CORONARY ANGIOGRAPHY;  Surgeon: Sherren Mocha, MD;  Location: [place] [facility];  Service: Cardiovascular;  Laterality: N/A;  . LUMBAR LAMINECTOMY/DECOMPRESSION MICRODISCECTOMY  05/07/2012   Procedure: LUMBAR LAMINECTOMY/DECOMPRESSION MICRODISCECTOMY 1 LEVEL;  Surgeon: Eustace Moore, MD;  Location: [facility];  Service: Neurosurgery;  Laterality: Right;  Lumbar Laminectomy Decompression Microdiscectomy Lumbar Three-Four  . multiple foot surgeries    . POLYPECTOMY  2016   TA  . REVERSE SHOULDER ARTHROPLASTY Left 07/12/2020   Procedure: REVERSE SHOULDER ARTHROPLASTY;  Surgeon: Hiram Gash, MD;  Location: [facility];  Service: Orthopedics;  Laterality: Left;  . ROTATOR CUFF REPAIR     right side  . TONSILLECTOMY    . TOTAL HIP ARTHROPLASTY  11/16/2012   Procedure: TOTAL HIP ARTHROPLASTY;  Surgeon: Gearlean Alf, MD;  [facility];  Service: Orthopedics;  Laterality: Right;  Right Total Hip Arthroplasty  . TUBAL LIGATION    . VAGOTOMY     approx. 35 years ago, states has a clamp mid epigastric area    MEDICATIONS: . acetaminophen (TYLENOL) 325 MG tablet  . aspirin EC 81 MG tablet  . buPROPion (WELLBUTRIN XL) 300 MG 24 hr tablet  . carvedilol (COREG) 12.5 MG tablet  . Cholecalciferol (VITAMIN D-3) 125 MCG (5000 UT) TABS  . cyclobenzaprine (FLEXERIL) 5 MG tablet  . donepezil (ARICEPT) 10 MG tablet  . DULoxetine (CYMBALTA) 60 MG capsule  . ezetimibe (ZETIA) 10 MG tablet  . gabapentin (NEURONTIN) 300 MG capsule  . MYRBETRIQ 50 MG TB24 tablet  . naproxen sodium (ALEVE) 220 MG tablet  . ondansetron (ZOFRAN-ODT) 8 MG disintegrating tablet  . pantoprazole (PROTONIX) 40 MG tablet  .  polyethylene glycol powder (GLYCOLAX/MIRALAX) 17 GM/SCOOP powder  . promethazine (PHENERGAN) 12.5 MG tablet  . raloxifene (EVISTA) 60 MG tablet  . simvastatin (ZOCOR) 40 MG tablet  . vitamin B-12 (CYANOCOBALAMIN) 1000 MCG tablet   No current facility-administered medications for this encounter.    If no changes, I anticipate pt can proceed with surgery as scheduled.   Willeen Cass, PhD, FNP-BC [facility] Short Stay Surgical [facility]/Anesthesiology Phone: [phone] 03/12/2021 2:14 PM

## 2021-03-12 NOTE — Anesthesia Preprocedure Evaluation (Addendum)
Anesthesia Evaluation  Patient identified by MRN, date of birth, ID band Patient awake    Reviewed: Allergy & Precautions, NPO status , Patient's Chart, lab work & pertinent test results  Airway Mallampati: II  TM Distance: >3 FB Neck ROM: Full    Dental no notable dental hx. (+) Dental Advisory Given   Pulmonary neg pulmonary ROS, former smoker,    Pulmonary exam normal breath sounds clear to auscultation       Cardiovascular hypertension, Pt. on home beta blockers and Pt. on medications + Past MI  Normal cardiovascular exam Rhythm:Regular Rate:Normal  Echo 01/2021 1. Left ventricular ejection fraction, by estimation, is 70 to 75%. The left ventricle has hyperdynamic function. The left ventricle has no regional wall motion abnormalities. There is moderate left ventricular hypertrophy. Left ventricular diastolic function could not be evaluated.  2. Right ventricular systolic function is normal. The right ventricular size is normal. There is mildly elevated pulmonary artery systolic pressure. The estimated right ventricular systolic pressure is 36.6 mmHg.  3. Left atrial size was mildly dilated.  4. The mitral valve is normal in structure. No evidence of mitral valve regurgitation. No evidence of mitral stenosis.  5. The aortic valve is tricuspid. There is mild calcification of the aortic valve. There is mild thickening of the aortic valve. Aortic valve regurgitation is trivial. Mild aortic valve stenosis. Aortic valve area, by VTI measures 1.98 cm. Aortic valve mean gradient measures 9.5 mmHg. Aortic valve Vmax measures 2.30 m/s.  6. The inferior vena cava is normal in size with greater than 50% respiratory variability, suggesting right atrial pressure of 3 mmHg.    Neuro/Psych PSYCHIATRIC DISORDERS Anxiety Depression CVA    GI/Hepatic Neg liver ROS, PUD, GERD  ,  Endo/Other  negative endocrine ROS  Renal/GU negative Renal ROS      Musculoskeletal  (+) Arthritis ,   Abdominal   Peds  Hematology  (+) Blood dyscrasia, anemia ,   Anesthesia Other Findings   Reproductive/Obstetrics                                                            Anesthesia Evaluation  Patient identified by MRN, date of birth, ID band Patient awake    Reviewed: Allergy & Precautions, H&P , NPO status , Patient's Chart, lab work & pertinent test results  Airway Mallampati: I  TM Distance: >3 FB Neck ROM: Full    Dental  (+) Teeth Intact, Dental Advisory Given,    Pulmonary former smoker,    Pulmonary exam normal breath sounds clear to auscultation       Cardiovascular hypertension, Pt. on medications (-) Peripheral Vascular Disease Normal cardiovascular exam Rhythm:Regular Rate:Normal     Neuro/Psych PSYCHIATRIC DISORDERS Anxiety Depression negative neurological ROS     GI/Hepatic Neg liver ROS, GERD  Medicated and Controlled,  Endo/Other  negative endocrine ROS  Renal/GU negative Renal ROS     Musculoskeletal  (+) Arthritis , Osteoarthritis,    Abdominal Normal abdominal exam  (+)   Peds  Hematology negative hematology ROS (+)   Anesthesia Other Findings   Reproductive/Obstetrics                             Anesthesia Physical  Anesthesia Plan  ASA: II  Anesthesia Plan: General   Post-op Pain Management:  Regional for Post-op pain   Induction:   PONV Risk Score and Plan: 3 and Ondansetron, Dexamethasone, Midazolam and Treatment may vary due to age or medical condition  Airway Management Planned: Oral ETT  Additional Equipment:   Intra-op Plan:   Post-operative Plan: Extubation in OR  Informed Consent: I have reviewed the patients History and Physical, chart, labs and discussed the procedure including the risks, benefits and alternatives for the proposed anesthesia with the patient or authorized representative who has  indicated his/her understanding and acceptance.     Dental advisory given  Plan Discussed with: CRNA  Anesthesia Plan Comments:         Anesthesia Quick Evaluation  Anesthesia Physical Anesthesia Plan  ASA: III  Anesthesia Plan: Spinal   Post-op Pain Management:  Regional for Post-op pain   Induction: Intravenous  PONV Risk Score and Plan: 3 and Ondansetron, Dexamethasone, Propofol infusion, TIVA and Treatment may vary due to age or medical condition  Airway Management Planned: Natural Airway  Additional Equipment: None  Intra-op Plan:   Post-operative Plan:   Informed Consent: I have reviewed the patients History and Physical, chart, labs and discussed the procedure including the risks, benefits and alternatives for the proposed anesthesia with the patient or authorized representative who has indicated his/her understanding and acceptance.     Dental advisory given  Plan Discussed with: CRNA  Anesthesia Plan Comments: (See APP note by Durel Salts, FNP )      Anesthesia Quick Evaluation

## 2021-03-13 DIAGNOSIS — W19XXXA Unspecified fall, initial encounter: Secondary | ICD-10-CM | POA: Diagnosis not present

## 2021-03-13 DIAGNOSIS — M25561 Pain in right knee: Secondary | ICD-10-CM | POA: Diagnosis not present

## 2021-03-13 DIAGNOSIS — E785 Hyperlipidemia, unspecified: Secondary | ICD-10-CM | POA: Diagnosis not present

## 2021-03-13 DIAGNOSIS — I5181 Takotsubo syndrome: Secondary | ICD-10-CM | POA: Diagnosis not present

## 2021-03-13 NOTE — H&P (Signed)
KNEE ARTHROPLASTY ADMISSION H&P  Patient ID: Colleen Glenn MRN: [id number] DOB/AGE: 1941/03/19 80 y.o.  Chief Complaint: right knee pain.  Planned Procedure Date: 03/20/21 Medical Clearance by Dr. Joylene Draft   Cardiac Clearance by Dr. Burt Knack   HPI: Colleen Glenn is a 80 y.o. female who presents for evaluation of OA RIGHT KNEE. The patient has a history of pain and functional disability in the right knee due to arthritis and has failed non-surgical conservative treatments for greater than 12 weeks to include NSAID's and/or analgesics, corticosteriod injections, use of assistive devices and activity modification.  Onset of symptoms was gradual, starting 3 years ago with rapidlly worsening course since that time. The patient noted no past surgery on the right knee.  Patient currently rates pain at 10 out of 10 with activity. Patient has night pain, worsening of pain with activity and weight bearing and pain that interferes with activities of daily living.  Patient has evidence of subchondral sclerosis, periarticular osteophytes and joint space narrowing by imaging studies.  There is no active infection.  Past Medical History:  Diagnosis Date  . Anxiety    on meds  . Arthritis    OA bil shoulders, hands, back  . Back pain   . BPPV (benign paroxysmal positional vertigo) 10/2019  . Bursitis   . Colon polyps    adenomatous  . Colon polyps   . Depression    on meds  . Diverticulosis   . Frequent falls 03/03/2021  . GERD (gastroesophageal reflux disease)    on meds  . Hemorrhoids   . Hyperlipidemia    on meds  . Mild hypertension    white coat syndrome- on meds  . Myocardial infarction ([place])   . OAB (overactive bladder)   . Osteopenia   . Panic disorder 11/2012  . Plantar fasciitis    hx of  . PUD (peptic ulcer disease)   . Shingles   . Stroke ([place])   . Takotsubo cardiomyopathy   . Tremor    mild action tremor suspected 2020  . Vitamin D deficiency    on meds   Past  Surgical History:  Procedure Laterality Date  . ABDOMINAL HYSTERECTOMY    . APPENDECTOMY    . BACK SURGERY    . COLONOSCOPY  2016   JP-MAC-TA  . FOOT SURGERY     bilateral   . HEMORRHOID SURGERY    . JOINT REPLACEMENT Right 2014  . laproscopic knee surgery Right    2018  . LEFT HEART CATH AND CORONARY ANGIOGRAPHY N/A 09/28/2020   Procedure: LEFT HEART CATH AND CORONARY ANGIOGRAPHY;  Surgeon: Sherren Mocha, MD;  Location: [place] [facility];  Service: Cardiovascular;  Laterality: N/A;  . LUMBAR LAMINECTOMY/DECOMPRESSION MICRODISCECTOMY  05/07/2012   Procedure: LUMBAR LAMINECTOMY/DECOMPRESSION MICRODISCECTOMY 1 LEVEL;  Surgeon: Eustace Moore, MD;  Location: [place] [facility];  Service: Neurosurgery;  Laterality: Right;  Lumbar Laminectomy Decompression Microdiscectomy Lumbar Three-Four  . multiple foot surgeries    . POLYPECTOMY  2016   TA  . REVERSE SHOULDER ARTHROPLASTY Left 07/12/2020   Procedure: REVERSE SHOULDER ARTHROPLASTY;  Surgeon: Hiram Gash, MD;  Location: [place];  Service: Orthopedics;  Laterality: Left;  . ROTATOR CUFF REPAIR     right side  . TONSILLECTOMY    . TOTAL HIP ARTHROPLASTY  11/16/2012   Procedure: TOTAL HIP ARTHROPLASTY;  Surgeon: Gearlean Alf, MD;  [facility];  Service: Orthopedics;  Laterality: Right;  Right Total Hip Arthroplasty  .  TUBAL LIGATION    . VAGOTOMY     approx. 35 years ago, states has a clamp mid epigastric area   No Known Allergies Prior to Admission medications   Medication Sig Start Date End Date Taking? Authorizing Provider  acetaminophen (TYLENOL) 325 MG tablet Take 325-650 mg by mouth every 6 (six) hours as needed for mild pain.   Yes [provider]  buPROPion (WELLBUTRIN XL) 300 MG 24 hr tablet Take 300 mg by mouth daily before breakfast.    Yes [provider]  carvedilol (COREG) 12.5 MG tablet Take 1 tablet (12.5 mg total) by mouth 2 (two) times daily with a meal. 10/25/20  Yes Imogene Burn, PA-C  Cholecalciferol (VITAMIN D-3) 125 MCG (5000 UT) TABS Take 5,000 Units by mouth daily after breakfast.   Yes [provider]  cyclobenzaprine (FLEXERIL) 5 MG tablet Take 5 mg by mouth 3 (three) times daily as needed for muscle spasms.   Yes [provider]  donepezil (ARICEPT) 10 MG tablet Take 10 mg by mouth at bedtime.   Yes [provider]  DULoxetine (CYMBALTA) 60 MG capsule Take 60 mg by mouth daily. 10/25/19  Yes [provider]  ezetimibe (ZETIA) 10 MG tablet Take 10 mg by mouth daily. 11/18/19  Yes [provider]  gabapentin (NEURONTIN) 300 MG capsule TAKE 1 CAPSULE(300 MG) BY MOUTH AT BEDTIME Patient taking differently: Take 300 mg by mouth at bedtime. 07/11/20  Yes Melvenia Beam, MD  MYRBETRIQ 50 MG TB24 tablet Take 50 mg by mouth daily. 03/06/20  Yes [provider]  naproxen sodium (ALEVE) 220 MG tablet Take 220-440 mg by mouth 2 (two) times daily as needed (for arthritic pain).   Yes [provider]  ondansetron (ZOFRAN-ODT) 8 MG disintegrating tablet Take 8 mg by mouth every 8 (eight) hours as needed for nausea/vomiting (DISSOLVE ORALLY). 09/13/20  Yes [provider]  pantoprazole (PROTONIX) 40 MG tablet TAKE 1 TABLET (40 MG TOTAL) BY MOUTH DAILY AT 6AM. Patient taking differently: Take 40 mg by mouth daily. 10/03/20 10/03/21 Yes Reino Bellis B, NP  polyethylene glycol powder (GLYCOLAX/MIRALAX) 17 GM/SCOOP powder Take 17 g by mouth daily as needed for moderate constipation.   Yes [provider]  promethazine (PHENERGAN) 12.5 MG tablet TAKE 1 TABLET (12.5 MG TOTAL) BY MOUTH EVERY SIX HOURS AS NEEDED FOR NAUSEA OR VOMITING. Patient taking differently: Take 12.5 mg by mouth every 6 (six) hours as needed for nausea or vomiting. 10/03/20 10/03/21 Yes Cheryln Manly, NP  raloxifene (EVISTA) 60 MG tablet Take 60 mg by mouth daily.   Yes [provider]  simvastatin (ZOCOR) 40 MG tablet  Take 40 mg by mouth daily.   Yes [provider]  aspirin EC 81 MG tablet Take 81 mg by mouth at bedtime. Swallow whole.    [provider]  vitamin B-12 (CYANOCOBALAMIN) 1000 MCG tablet Take 1,000 mcg by mouth daily.    [provider]   Social History   Socioeconomic History  . Marital status: Widowed    Spouse name: Not on file  . Number of children: 3  . Years of education: cosmetology degree  . Highest education level: GED or equivalent  Occupational History  . Occupation: Retired  Tobacco Use  . Smoking status: Former Smoker    Packs/day: 1.00    Years: 20.00    Pack years: 20.00    Types: Cigarettes    Quit date: 1983  Years since quitting: 39.4  . Smokeless tobacco: Never Used  . Tobacco comment: 45 years ago  Vaping Use  . Vaping Use: Never used  Substance and Sexual Activity  . Alcohol use: Yes    Alcohol/week: 2.0 - 4.0 standard drinks    Types: 2 - 4 Glasses of wine per week    Comment: every 3-4 days  . Drug use: No  . Sexual activity: Not Currently    Partners: Male  Other Topics Concern  . Not on file  Social History Narrative   Lives alone   Right handed   Social Determinants of Health   Financial Resource Strain: Not on file  Food Insecurity: Not on file  Transportation Needs: Not on file  Physical Activity: Not on file  Stress: Not on file  Social Connections: Not on file   Family History  Problem Relation Age of Onset  . Colon polyps Mother   . Stroke Mother   . Hypertension Mother   . Congestive Heart Failure Mother   . Stroke Other        several family members on mother's side  . Lung cancer Father 18  . Heart disease Father   . Colon cancer Neg Hx   . Rectal cancer Neg Hx   . Stomach cancer Neg Hx   . Esophageal cancer Neg Hx     ROS: Currently denies lightheadedness, dizziness, Fever, chills, CP, SOB.   No personal history of DVT, PE, or CVA. H/o MI and heart catheterization in Dec 2021. No loose  teeth or dentures All other systems have been reviewed and were otherwise currently negative with the exception of those mentioned in the HPI and as above.  Objective: Vitals: Ht: 5'5" Wt: 155.8 lbs Temp: 96.1 BP: 119/68 Pulse: 73 O2 98% on room air.   Physical Exam: General: Alert, NAD.  Antalgic Gait  HEENT: EOMI, Good Neck Extension. Head is normocephalic, atraumatic. No pharyngeal erythema Pulm: No increased work of breathing. Clear B/L A/P w/o crackle or wheeze. CV: RRR, No m/g/r appreciated  GI: soft, NT, ND. BS x 4 quadrants Neuro: CN II-XII grossly intact without focal deficit.  Sensation intact distally Skin: No lesions in the area of chief complaint MSK/Surgical Site: right knee w/o redness or effusion. + JLT. ROM 20-90 degrees. ROM painful. Decreased strength in extension and flexion.  +EHL/FHL.  NVI.  Stable varus and valgus stress.    Imaging Review Plain radiographs demonstrate severe degenerative joint disease of the right knee.   The overall alignment ismild valgus. The bone quality appears to be fair for age and reported activity level.  Preoperative templating of the joint replacement has been completed, documented, and submitted to the Operating Room personnel in order to optimize intra-operative equipment management.  Assessment: OA RIGHT KNEE Active Problems:   * No active hospital problems. *   Plan: Plan for Procedure(s): TOTAL KNEE ARTHROPLASTY  The patient history, physical exam, clinical judgement of the provider and imaging are consistent with end stage degenerative joint disease and total joint arthroplasty is deemed medically necessary. The treatment options including medical management, injection therapy, and arthroplasty were discussed at length. The risks and benefits of Procedure(s): TOTAL KNEE ARTHROPLASTY were presented and reviewed.  The risks of nonoperative treatment, versus surgical intervention including but not limited to continued pain,  aseptic loosening, stiffness, dislocation/subluxation, infection, bleeding, nerve injury, blood clots, cardiopulmonary complications, morbidity, mortality, among others were discussed. The patient verbalizes understanding and wishes to proceed with the  plan.  Patient is being admitted for inpatient treatment for surgery, pain control, PT, prophylactic antibiotics, VTE prophylaxis, progressive ambulation, ADL's and discharge planning. She would like to spend the night in observation.  Dental prophylaxis discussed and recommended for 2 years postoperatively.   The patient does meet the criteria for TXA which will be used perioperatively.    ASA 81 mg BID will be used postoperatively for DVT prophylaxis in addition to SCDs, and early ambulation.  Plan for Tylenol, Gabapentin, oxycodone for pain.    Robaxin for muscle spasms.   Zofran for nausea and vomiting.  Already takes pantoprazole for gastric protection.  Send medicines to [facility]  The patient is planning to be discharged home with OPPT and into the care of her daughter Coralyn Mark who can be reached at [phone]  Follow up appt 04/04/21 at 11:15am      Alisa Graff Office [phone] 03/13/2021 1:57 PM

## 2021-03-13 NOTE — Care Plan (Signed)
Ortho Bundle Case Management Note  Patient Details  Name: Colleen Glenn MRN: [id number] Date of Birth: 1941-03-15  Spoke with patient and daughter prior to surgery. She will discharge to daughter's home. Has rolling walker. CPM ordered for home use. HHPT referral to [facility]. OPPT set up with [facility]  Patient and MD in agreement with this plan. Choice offered.                     DME Arranged:  CPM DME Agency:  [facility]  HH Arranged:  PT HH Agency:  [facility]  Additional Comments: Please contact me with any questions of if this plan should need to change.  Ladell Heads,  [facility] Specialist  [phone] 03/13/2021, 11:11 AM

## 2021-03-16 ENCOUNTER — Other Ambulatory Visit (HOSPITAL_COMMUNITY)
Admission: RE | Admit: 2021-03-16 | Discharge: 2021-03-16 | Disposition: A | Discharge status: Home / Self Care | Payer: Medicare Other | Source: Ambulatory Visit | Attending: Orthopedic Surgery | Admitting: Orthopedic Surgery

## 2021-03-16 DIAGNOSIS — Z01812 Encounter for preprocedural laboratory examination: Secondary | ICD-10-CM | POA: Diagnosis not present

## 2021-03-16 DIAGNOSIS — Z20822 Contact with and (suspected) exposure to covid-19: Secondary | ICD-10-CM | POA: Insufficient documentation

## 2021-03-17 LAB — SARS CORONAVIRUS 2 (TAT 6-24 HRS): SARS Coronavirus 2: NEGATIVE

## 2021-03-19 MED ORDER — BUPIVACAINE LIPOSOME 1.3 % IJ SUSP
10.0000 mL | Freq: Once | INTRAMUSCULAR | Status: DC
  Filled 2021-03-19 (×2): qty 10

## 2021-03-20 ENCOUNTER — Inpatient Hospital Stay (HOSPITAL_COMMUNITY)
Admission: RE | Admit: 2021-03-20 | Discharge: 2021-03-22 | DRG: 470 | Disposition: A | Discharge status: Home Health | Payer: Medicare Other | Attending: Orthopedic Surgery | Admitting: Orthopedic Surgery

## 2021-03-20 ENCOUNTER — Ambulatory Visit (HOSPITAL_COMMUNITY): Payer: Medicare Other | Admitting: Certified Registered Nurse Anesthetist

## 2021-03-20 ENCOUNTER — Encounter (HOSPITAL_COMMUNITY): Payer: Self-pay | Admitting: Orthopedic Surgery

## 2021-03-20 ENCOUNTER — Other Ambulatory Visit: Payer: Self-pay

## 2021-03-20 ENCOUNTER — Encounter (HOSPITAL_COMMUNITY)
Admission: RE | Disposition: A | Discharge status: Home Health | Payer: Self-pay | Source: Home / Self Care | Attending: Orthopedic Surgery

## 2021-03-20 ENCOUNTER — Ambulatory Visit (HOSPITAL_COMMUNITY): Payer: Medicare Other | Admitting: Physician Assistant

## 2021-03-20 ENCOUNTER — Observation Stay (HOSPITAL_COMMUNITY): Payer: Medicare Other

## 2021-03-20 DIAGNOSIS — Z20822 Contact with and (suspected) exposure to covid-19: Secondary | ICD-10-CM | POA: Diagnosis not present

## 2021-03-20 DIAGNOSIS — F32A Depression, unspecified: Secondary | ICD-10-CM | POA: Diagnosis present

## 2021-03-20 DIAGNOSIS — Z96641 Presence of right artificial hip joint: Secondary | ICD-10-CM | POA: Diagnosis not present

## 2021-03-20 DIAGNOSIS — I252 Old myocardial infarction: Secondary | ICD-10-CM

## 2021-03-20 DIAGNOSIS — M1711 Unilateral primary osteoarthritis, right knee: Principal | ICD-10-CM | POA: Diagnosis present

## 2021-03-20 DIAGNOSIS — Z79899 Other long term (current) drug therapy: Secondary | ICD-10-CM

## 2021-03-20 DIAGNOSIS — Z96651 Presence of right artificial knee joint: Secondary | ICD-10-CM

## 2021-03-20 DIAGNOSIS — F410 Panic disorder [episodic paroxysmal anxiety] without agoraphobia: Secondary | ICD-10-CM | POA: Diagnosis present

## 2021-03-20 DIAGNOSIS — E785 Hyperlipidemia, unspecified: Secondary | ICD-10-CM | POA: Diagnosis not present

## 2021-03-20 DIAGNOSIS — N3281 Overactive bladder: Secondary | ICD-10-CM | POA: Diagnosis present

## 2021-03-20 DIAGNOSIS — I5181 Takotsubo syndrome: Secondary | ICD-10-CM | POA: Diagnosis present

## 2021-03-20 DIAGNOSIS — I10 Essential (primary) hypertension: Secondary | ICD-10-CM | POA: Diagnosis not present

## 2021-03-20 DIAGNOSIS — Z7982 Long term (current) use of aspirin: Secondary | ICD-10-CM

## 2021-03-20 DIAGNOSIS — F411 Generalized anxiety disorder: Secondary | ICD-10-CM | POA: Diagnosis not present

## 2021-03-20 DIAGNOSIS — Z90710 Acquired absence of both cervix and uterus: Secondary | ICD-10-CM

## 2021-03-20 DIAGNOSIS — K219 Gastro-esophageal reflux disease without esophagitis: Secondary | ICD-10-CM | POA: Diagnosis not present

## 2021-03-20 DIAGNOSIS — E559 Vitamin D deficiency, unspecified: Secondary | ICD-10-CM | POA: Diagnosis not present

## 2021-03-20 DIAGNOSIS — I739 Peripheral vascular disease, unspecified: Secondary | ICD-10-CM | POA: Diagnosis not present

## 2021-03-20 DIAGNOSIS — Z471 Aftercare following joint replacement surgery: Secondary | ICD-10-CM | POA: Diagnosis not present

## 2021-03-20 DIAGNOSIS — Z8673 Personal history of transient ischemic attack (TIA), and cerebral infarction without residual deficits: Secondary | ICD-10-CM

## 2021-03-20 DIAGNOSIS — G8918 Other acute postprocedural pain: Secondary | ICD-10-CM | POA: Diagnosis not present

## 2021-03-20 DIAGNOSIS — Z87891 Personal history of nicotine dependence: Secondary | ICD-10-CM

## 2021-03-20 HISTORY — PX: TOTAL KNEE ARTHROPLASTY: SHX125

## 2021-03-20 SURGERY — ARTHROPLASTY, KNEE, TOTAL
Anesthesia: Spinal | Site: Knee | Laterality: Right

## 2021-03-20 MED ORDER — MIRABEGRON ER 25 MG PO TB24
50.0000 mg | ORAL_TABLET | Freq: Every day | ORAL | Status: DC
  Administered 2021-03-20 – 2021-03-22 (×3): 50 mg via ORAL
  Filled 2021-03-20 (×3): qty 2

## 2021-03-20 MED ORDER — ACETAMINOPHEN 500 MG PO TABS
1000.0000 mg | ORAL_TABLET | Freq: Once | ORAL | Status: AC
  Administered 2021-03-20: 1000 mg via ORAL
  Filled 2021-03-20: qty 2

## 2021-03-20 MED ORDER — OXYCODONE HCL 5 MG PO TABS
10.0000 mg | ORAL_TABLET | ORAL | Status: DC | PRN
  Administered 2021-03-20 – 2021-03-22 (×3): 10 mg via ORAL
  Filled 2021-03-20 (×2): qty 2

## 2021-03-20 MED ORDER — PHENYLEPHRINE HCL-NACL 10-0.9 MG/250ML-% IV SOLN
INTRAVENOUS | Status: AC
  Filled 2021-03-20: qty 250

## 2021-03-20 MED ORDER — ASPIRIN 81 MG PO CHEW
81.0000 mg | CHEWABLE_TABLET | Freq: Two times a day (BID) | ORAL | Status: DC
  Administered 2021-03-20 – 2021-03-22 (×4): 81 mg via ORAL
  Filled 2021-03-20 (×4): qty 1

## 2021-03-20 MED ORDER — OXYCODONE HCL 5 MG PO TABS
5.0000 mg | ORAL_TABLET | ORAL | Status: DC | PRN
  Administered 2021-03-21 (×2): 10 mg via ORAL
  Filled 2021-03-20 (×4): qty 2

## 2021-03-20 MED ORDER — CEFAZOLIN SODIUM-DEXTROSE 1-4 GM/50ML-% IV SOLN
1.0000 g | Freq: Four times a day (QID) | INTRAVENOUS | Status: AC
  Administered 2021-03-20 (×2): 1 g via INTRAVENOUS
  Filled 2021-03-20 (×2): qty 50

## 2021-03-20 MED ORDER — RALOXIFENE HCL 60 MG PO TABS
60.0000 mg | ORAL_TABLET | Freq: Every day | ORAL | Status: DC
  Administered 2021-03-20 – 2021-03-22 (×3): 60 mg via ORAL
  Filled 2021-03-20 (×3): qty 1

## 2021-03-20 MED ORDER — FENTANYL CITRATE (PF) 100 MCG/2ML IJ SOLN
INTRAMUSCULAR | Status: DC | PRN
  Administered 2021-03-20 (×2): 50 ug via INTRAVENOUS

## 2021-03-20 MED ORDER — BISACODYL 10 MG RE SUPP: 10.0000 mg | Freq: Every day | RECTAL | Status: DC | PRN

## 2021-03-20 MED ORDER — MIDAZOLAM HCL 5 MG/5ML IJ SOLN
INTRAMUSCULAR | Status: DC | PRN
  Administered 2021-03-20 (×2): 1 mg via INTRAVENOUS

## 2021-03-20 MED ORDER — TRANEXAMIC ACID-NACL 1000-0.7 MG/100ML-% IV SOLN
1000.0000 mg | INTRAVENOUS | Status: AC
  Administered 2021-03-20: 1000 mg via INTRAVENOUS
  Filled 2021-03-20: qty 100

## 2021-03-20 MED ORDER — WATER FOR IRRIGATION, STERILE IR SOLN
Status: DC | PRN
  Administered 2021-03-20: 2000 mL

## 2021-03-20 MED ORDER — PANTOPRAZOLE SODIUM 40 MG PO TBEC
40.0000 mg | DELAYED_RELEASE_TABLET | Freq: Every day | ORAL | Status: DC
  Administered 2021-03-20 – 2021-03-22 (×3): 40 mg via ORAL
  Filled 2021-03-20 (×3): qty 1

## 2021-03-20 MED ORDER — DEXAMETHASONE SODIUM PHOSPHATE 10 MG/ML IJ SOLN
8.0000 mg | Freq: Once | INTRAMUSCULAR | Status: AC
  Administered 2021-03-20: 8 mg via INTRAVENOUS

## 2021-03-20 MED ORDER — BUPIVACAINE IN DEXTROSE 0.75-8.25 % IT SOLN
INTRATHECAL | Status: DC | PRN
  Administered 2021-03-20: 1.8 mL via INTRATHECAL

## 2021-03-20 MED ORDER — ORAL CARE MOUTH RINSE
15.0000 mL | Freq: Once | OROMUCOSAL | Status: AC
  Administered 2021-03-20: 15 mL via OROMUCOSAL

## 2021-03-20 MED ORDER — FENTANYL CITRATE (PF) 100 MCG/2ML IJ SOLN
INTRAMUSCULAR | Status: AC
  Filled 2021-03-20: qty 2

## 2021-03-20 MED ORDER — PROPOFOL 1000 MG/100ML IV EMUL
INTRAVENOUS | Status: AC
  Filled 2021-03-20: qty 100

## 2021-03-20 MED ORDER — MAGNESIUM CITRATE PO SOLN: 1.0000 | Freq: Once | ORAL | Status: DC | PRN

## 2021-03-20 MED ORDER — ONDANSETRON HCL 4 MG/2ML IJ SOLN
INTRAMUSCULAR | Status: AC
  Filled 2021-03-20: qty 2

## 2021-03-20 MED ORDER — BUPROPION HCL ER (XL) 300 MG PO TB24
300.0000 mg | ORAL_TABLET | Freq: Every day | ORAL | Status: DC
  Administered 2021-03-21 – 2021-03-22 (×2): 300 mg via ORAL
  Filled 2021-03-20 (×2): qty 1

## 2021-03-20 MED ORDER — BUPIVACAINE-EPINEPHRINE (PF) 0.25% -1:200000 IJ SOLN
INTRAMUSCULAR | Status: AC
  Filled 2021-03-20: qty 30

## 2021-03-20 MED ORDER — MENTHOL 3 MG MT LOZG: 1.0000 | LOZENGE | OROMUCOSAL | Status: DC | PRN

## 2021-03-20 MED ORDER — METOCLOPRAMIDE HCL 5 MG/ML IJ SOLN: 5.0000 mg | Freq: Three times a day (TID) | INTRAMUSCULAR | Status: DC | PRN

## 2021-03-20 MED ORDER — DULOXETINE HCL 60 MG PO CPEP
60.0000 mg | ORAL_CAPSULE | Freq: Every day | ORAL | Status: DC
  Administered 2021-03-20 – 2021-03-22 (×3): 60 mg via ORAL
  Filled 2021-03-20 (×3): qty 1

## 2021-03-20 MED ORDER — PROPOFOL 500 MG/50ML IV EMUL
INTRAVENOUS | Status: DC | PRN
  Administered 2021-03-20: 75 ug/kg/min via INTRAVENOUS

## 2021-03-20 MED ORDER — ONDANSETRON HCL 4 MG/2ML IJ SOLN
INTRAMUSCULAR | Status: DC | PRN
  Administered 2021-03-20: 4 mg via INTRAVENOUS

## 2021-03-20 MED ORDER — CARVEDILOL 12.5 MG PO TABS
12.5000 mg | ORAL_TABLET | Freq: Two times a day (BID) | ORAL | Status: DC
  Administered 2021-03-20 – 2021-03-22 (×4): 12.5 mg via ORAL
  Filled 2021-03-20 (×4): qty 1

## 2021-03-20 MED ORDER — PROMETHAZINE HCL 25 MG/ML IJ SOLN: 6.2500 mg | INTRAMUSCULAR | Status: DC | PRN

## 2021-03-20 MED ORDER — POVIDONE-IODINE 10 % EX SWAB
2.0000 "application " | Freq: Once | CUTANEOUS | Status: AC
  Administered 2021-03-20: 2 via TOPICAL

## 2021-03-20 MED ORDER — SODIUM CHLORIDE 0.9 % IR SOLN
Status: DC | PRN
  Administered 2021-03-20: 1000 mL

## 2021-03-20 MED ORDER — CHLORHEXIDINE GLUCONATE 0.12 % MT SOLN: 15.0000 mL | Freq: Once | OROMUCOSAL | Status: AC

## 2021-03-20 MED ORDER — TRANEXAMIC ACID-NACL 1000-0.7 MG/100ML-% IV SOLN
1000.0000 mg | Freq: Once | INTRAVENOUS | Status: AC
  Administered 2021-03-20: 1000 mg via INTRAVENOUS
  Filled 2021-03-20: qty 100

## 2021-03-20 MED ORDER — HYDROMORPHONE HCL 1 MG/ML IJ SOLN: 0.2500 mg | INTRAMUSCULAR | Status: DC | PRN

## 2021-03-20 MED ORDER — ONDANSETRON HCL 4 MG/2ML IJ SOLN: 4.0000 mg | Freq: Four times a day (QID) | INTRAMUSCULAR | Status: DC | PRN

## 2021-03-20 MED ORDER — DONEPEZIL HCL 10 MG PO TABS
10.0000 mg | ORAL_TABLET | Freq: Every day | ORAL | Status: DC
  Administered 2021-03-20 – 2021-03-21 (×2): 10 mg via ORAL
  Filled 2021-03-20 (×2): qty 1

## 2021-03-20 MED ORDER — SIMVASTATIN 40 MG PO TABS
40.0000 mg | ORAL_TABLET | Freq: Every day | ORAL | Status: DC
  Administered 2021-03-20 – 2021-03-22 (×3): 40 mg via ORAL
  Filled 2021-03-20 (×3): qty 1

## 2021-03-20 MED ORDER — ONDANSETRON HCL 4 MG PO TABS
4.0000 mg | ORAL_TABLET | Freq: Four times a day (QID) | ORAL | Status: DC | PRN
  Filled 2021-03-20: qty 1

## 2021-03-20 MED ORDER — POLYETHYLENE GLYCOL 3350 17 G PO PACK
17.0000 g | PACK | Freq: Every day | ORAL | Status: DC | PRN
  Administered 2021-03-21: 17 g via ORAL
  Filled 2021-03-20: qty 1

## 2021-03-20 MED ORDER — DOCUSATE SODIUM 100 MG PO CAPS
100.0000 mg | ORAL_CAPSULE | Freq: Two times a day (BID) | ORAL | Status: DC
  Administered 2021-03-20 – 2021-03-22 (×5): 100 mg via ORAL
  Filled 2021-03-20 (×5): qty 1

## 2021-03-20 MED ORDER — EZETIMIBE 10 MG PO TABS
10.0000 mg | ORAL_TABLET | Freq: Every day | ORAL | Status: DC
  Administered 2021-03-20 – 2021-03-22 (×3): 10 mg via ORAL
  Filled 2021-03-20 (×3): qty 1

## 2021-03-20 MED ORDER — PHENYLEPHRINE HCL-NACL 10-0.9 MG/250ML-% IV SOLN
INTRAVENOUS | Status: DC | PRN
  Administered 2021-03-20: 50 ug/min via INTRAVENOUS

## 2021-03-20 MED ORDER — ALUM & MAG HYDROXIDE-SIMETH 200-200-20 MG/5ML PO SUSP: 30.0000 mL | ORAL | Status: DC | PRN

## 2021-03-20 MED ORDER — DEXAMETHASONE SODIUM PHOSPHATE 10 MG/ML IJ SOLN
INTRAMUSCULAR | Status: AC
  Filled 2021-03-20: qty 1

## 2021-03-20 MED ORDER — METHOCARBAMOL 500 MG PO TABS
500.0000 mg | ORAL_TABLET | Freq: Four times a day (QID) | ORAL | Status: DC | PRN
  Administered 2021-03-21 (×2): 500 mg via ORAL
  Filled 2021-03-20 (×3): qty 1

## 2021-03-20 MED ORDER — DIPHENHYDRAMINE HCL 12.5 MG/5ML PO ELIX
12.5000 mg | ORAL_SOLUTION | ORAL | Status: DC | PRN
  Administered 2021-03-21: 12.5 mg via ORAL
  Filled 2021-03-20: qty 5

## 2021-03-20 MED ORDER — SODIUM CHLORIDE (PF) 0.9 % IJ SOLN
INTRAMUSCULAR | Status: AC
  Filled 2021-03-20: qty 30

## 2021-03-20 MED ORDER — TRAMADOL HCL 50 MG PO TABS
50.0000 mg | ORAL_TABLET | Freq: Four times a day (QID) | ORAL | Status: DC
  Administered 2021-03-20 – 2021-03-22 (×9): 50 mg via ORAL
  Filled 2021-03-20 (×9): qty 1

## 2021-03-20 MED ORDER — DEXAMETHASONE SODIUM PHOSPHATE 10 MG/ML IJ SOLN
10.0000 mg | Freq: Once | INTRAMUSCULAR | Status: AC
  Administered 2021-03-21: 10 mg via INTRAVENOUS
  Filled 2021-03-20: qty 1

## 2021-03-20 MED ORDER — 0.9 % SODIUM CHLORIDE (POUR BTL) OPTIME
TOPICAL | Status: DC | PRN
  Administered 2021-03-20: 1000 mL

## 2021-03-20 MED ORDER — SODIUM CHLORIDE 0.9% FLUSH
INTRAVENOUS | Status: DC | PRN
  Administered 2021-03-20: 30 mL

## 2021-03-20 MED ORDER — ACETAMINOPHEN 325 MG PO TABS: 325.0000 mg | ORAL_TABLET | Freq: Four times a day (QID) | ORAL | Status: DC | PRN

## 2021-03-20 MED ORDER — CEFAZOLIN SODIUM-DEXTROSE 2-4 GM/100ML-% IV SOLN
2.0000 g | INTRAVENOUS | Status: AC
  Administered 2021-03-20: 2 g via INTRAVENOUS
  Filled 2021-03-20: qty 100

## 2021-03-20 MED ORDER — METOCLOPRAMIDE HCL 5 MG PO TABS
5.0000 mg | ORAL_TABLET | Freq: Three times a day (TID) | ORAL | Status: DC | PRN
  Filled 2021-03-20: qty 2

## 2021-03-20 MED ORDER — ACETAMINOPHEN 500 MG PO TABS
1000.0000 mg | ORAL_TABLET | Freq: Four times a day (QID) | ORAL | Status: AC
  Administered 2021-03-20 – 2021-03-21 (×3): 1000 mg via ORAL
  Filled 2021-03-20 (×4): qty 2

## 2021-03-20 MED ORDER — BUPIVACAINE LIPOSOME 1.3 % IJ SUSP
INTRAMUSCULAR | Status: DC | PRN
  Administered 2021-03-20: 20 mL

## 2021-03-20 MED ORDER — METHOCARBAMOL 500 MG IVPB - SIMPLE MED
500.0000 mg | Freq: Four times a day (QID) | INTRAVENOUS | Status: DC | PRN
  Filled 2021-03-20: qty 50

## 2021-03-20 MED ORDER — MIDAZOLAM HCL 2 MG/2ML IJ SOLN
INTRAMUSCULAR | Status: AC
  Filled 2021-03-20: qty 2

## 2021-03-20 MED ORDER — PHENOL 1.4 % MT LIQD: 1.0000 | OROMUCOSAL | Status: DC | PRN

## 2021-03-20 MED ORDER — LACTATED RINGERS IV SOLN: INTRAVENOUS | Status: DC

## 2021-03-20 MED ORDER — HYDROMORPHONE HCL 1 MG/ML IJ SOLN
0.5000 mg | INTRAMUSCULAR | Status: DC | PRN
Start: 2021-03-20 — End: 2021-03-22
  Administered 2021-03-20: 0.5 mg via INTRAVENOUS
  Filled 2021-03-20: qty 1

## 2021-03-20 MED ORDER — BUPIVACAINE-EPINEPHRINE 0.25% -1:200000 IJ SOLN
INTRAMUSCULAR | Status: DC | PRN
  Administered 2021-03-20: 30 mL

## 2021-03-20 MED ORDER — SODIUM CHLORIDE 0.9 % IV SOLN
INTRAVENOUS | Status: DC | PRN
  Administered 2021-03-20: 250 mL via INTRAVENOUS

## 2021-03-20 SURGICAL SUPPLY — 50 items
BLADE HEX COATED 2.75 (ELECTRODE) ×2 IMPLANT
BLADE SAG 18X100X1.27 (BLADE) ×2 IMPLANT
BLADE SAGITTAL 25.0X1.37X90 (BLADE) ×2 IMPLANT
BLADE SURG 15 STRL LF DISP TIS (BLADE) ×1 IMPLANT
BLADE SURG 15 STRL SS (BLADE) ×2
BLADE SURG SZ10 CARB STEEL (BLADE) ×4 IMPLANT
BNDG CMPR MED 10X6 ELC LF (GAUZE/BANDAGES/DRESSINGS) ×1
BNDG ELASTIC 6X10 VLCR STRL LF (GAUZE/BANDAGES/DRESSINGS) ×2 IMPLANT
BOWL SMART MIX CTS (DISPOSABLE) IMPLANT
BSPLAT TIB 4 KN TRITANIUM (Knees) ×1 IMPLANT
CLSR STERI-STRIP ANTIMIC 1/2X4 (GAUZE/BANDAGES/DRESSINGS) ×2 IMPLANT
COMPONENT TRI CR RETAIN KNEE (Orthopedic Implant) IMPLANT
COVER SURGICAL LIGHT HANDLE (MISCELLANEOUS) ×2 IMPLANT
COVER WAND RF STERILE (DRAPES) ×1 IMPLANT
CUFF TOURN SGL QUICK 34 (TOURNIQUET CUFF) ×2
CUFF TRNQT CYL 34X4.125X (TOURNIQUET CUFF) ×1 IMPLANT
DECANTER SPIKE VIAL GLASS SM (MISCELLANEOUS) ×2 IMPLANT
DRAPE U-SHAPE 47X51 STRL (DRAPES) ×2 IMPLANT
DRSG MEPILEX BORDER 4X12 (GAUZE/BANDAGES/DRESSINGS) ×2 IMPLANT
DURAPREP 26ML APPLICATOR (WOUND CARE) ×4 IMPLANT
GLOVE SRG 8 PF TXTR STRL LF DI (GLOVE) ×1 IMPLANT
GLOVE SURG ENC MOIS LTX SZ7.5 (GLOVE) ×2 IMPLANT
GLOVE SURG POLYISO LF SZ7.5 (GLOVE) ×2 IMPLANT
GLOVE SURG UNDER POLY LF SZ7.5 (GLOVE) ×2 IMPLANT
GLOVE SURG UNDER POLY LF SZ8 (GLOVE) ×2
GOWN STRL REUS W/TWL LRG LVL3 (GOWN DISPOSABLE) ×2 IMPLANT
GOWN STRL REUS W/TWL XL LVL3 (GOWN DISPOSABLE) ×2 IMPLANT
HANDPIECE INTERPULSE COAX TIP (DISPOSABLE) ×2
HOLDER FOLEY CATH W/STRAP (MISCELLANEOUS) ×1 IMPLANT
IMMOBILIZER KNEE 22 UNIV (SOFTGOODS) ×2 IMPLANT
INSERT TRIATH CS SZ4 10 (Insert) ×1 IMPLANT
KIT TURNOVER KIT A (KITS) ×2 IMPLANT
KNEE PATELLA ASYMMETRIC 9X29 (Knees) ×1 IMPLANT
KNEE TIBIAL COMP TRI SZ4 (Knees) ×1 IMPLANT
MANIFOLD NEPTUNE II (INSTRUMENTS) ×2 IMPLANT
NS IRRIG 1000ML POUR BTL (IV SOLUTION) ×2 IMPLANT
PACK ICE MAXI GEL EZY WRAP (MISCELLANEOUS) ×2 IMPLANT
PACK TOTAL KNEE CUSTOM (KITS) ×2 IMPLANT
PENCIL SMOKE EVACUATOR (MISCELLANEOUS) IMPLANT
PIN FLUTED HEDLESS FIX 3.5X1/8 (PIN) ×1 IMPLANT
PROTECTOR NERVE ULNAR (MISCELLANEOUS) ×2 IMPLANT
SET HNDPC FAN SPRY TIP SCT (DISPOSABLE) ×1 IMPLANT
SUT MNCRL AB 3-0 PS2 18 (SUTURE) ×2 IMPLANT
SUT VIC AB 0 CT1 36 (SUTURE) ×2 IMPLANT
SUT VIC AB 1 CT1 36 (SUTURE) ×4 IMPLANT
SUT VIC AB 2-0 CT1 27 (SUTURE) ×2
SUT VIC AB 2-0 CT1 TAPERPNT 27 (SUTURE) ×1 IMPLANT
TRAY FOLEY MTR SLVR 14FR STAT (SET/KITS/TRAYS/PACK) ×1 IMPLANT
TRIA CRUCIATE RETAIN KNEE (Orthopedic Implant) ×2 IMPLANT
TUBE SUCTION HIGH CAP CLEAR NV (SUCTIONS) ×2 IMPLANT

## 2021-03-20 NOTE — Interval H&P Note (Signed)
History and Physical Interval Note:  03/20/2021 7:22 AM  Colleen Glenn  has presented today for surgery, with the diagnosis of OA RIGHT KNEE.  The various methods of treatment have been discussed with the patient and family. After consideration of risks, benefits and other options for treatment, the patient has consented to  Procedure(s): TOTAL KNEE ARTHROPLASTY (Right) as a surgical intervention.  The patient's history has been reviewed, patient examined, no change in status, stable for surgery.  I have reviewed the patient's chart and labs.  Questions were answered to the patient's satisfaction.     Renette Butters

## 2021-03-20 NOTE — Anesthesia Procedure Notes (Signed)
Spinal  Patient location during procedure: OR Start time: 03/20/2021 7:28 AM End time: 03/20/2021 7:33 AM Reason for block: surgical anesthesia Staffing Anesthesiologist: Nolon Nations, MD Preanesthetic Checklist Completed: patient identified, IV checked, site marked, risks and benefits discussed, surgical consent, monitors and equipment checked, pre-op evaluation and timeout performed Spinal Block Patient position: sitting Prep: DuraPrep Patient monitoring: heart rate, cardiac monitor, continuous pulse ox and blood pressure Approach: right paramedian Location: L2-3 Injection technique: single-shot Needle Needle type: Sprotte  Needle gauge: 24 G Needle length: 9 cm Assessment Sensory level: T4 Events: CSF return

## 2021-03-20 NOTE — Op Note (Signed)
DATE OF SURGERY:  03/20/2021 TIME: 8:42 AM  PATIENT NAME:  Colleen Glenn   AGE: 80 y.o.    PRE-OPERATIVE DIAGNOSIS:  OSTEOARTHRITIS RIGHT KNEE  POST-OPERATIVE DIAGNOSIS:  Same  PROCEDURE:  Procedure(s): TOTAL KNEE ARTHROPLASTY   SURGEON:  Renette Butters, MD   ASSISTANT:  Aggie Moats, PA-C, he was present and scrubbed throughout the case, critical for completion in a timely fashion, and for retraction, instrumentation, and closure.    OPERATIVE IMPLANTS: [employer] Triathlon CR. Press fit knee  Femur size 3, Tibia size 4, Patella size 29 3-peg oval button, with a 10 mm polyethylene insert.   PREOPERATIVE INDICATIONS:  [name] is a 80 y.o. year old female with end stage bone on bone degenerative arthritis of the knee who failed conservative treatment, including injections, antiinflammatories, activity modification, and assistive devices, and had significant impairment of their activities of daily living, and elected for Total Knee Arthroplasty.   The risks, benefits, and alternatives were discussed at length including but not limited to the risks of infection, bleeding, nerve injury, stiffness, blood clots, the need for revision surgery, cardiopulmonary complications, among others, and they were willing to proceed.   OPERATIVE DESCRIPTION:  The patient was brought to the operative room and placed in a supine position.  General anesthesia was administered.  IV antibiotics were given.  The lower extremity was prepped and draped in the usual sterile fashion.  Time out was performed.  The leg was elevated and exsanguinated and the tourniquet was inflated.  Anterior approach was performed.  The patella was everted and osteophytes were removed.  The anterior horn of the medial and lateral meniscus was removed.   The distal femur was opened with the drill and the intramedullary distal femoral cutting jig was utilized, set at 5 degrees resecting 8 mm off the distal femur.   Care was taken to protect the collateral ligaments.  The distal femoral sizing jig was applied, taking care to avoid notching.  Then the 4-in-1 cutting jig was applied and the anterior and posterior femur was cut, along with the chamfer cuts.  All posterior osteophytes were removed.  The flexion gap was then measured and was symmetric with the extension gap.  Then the extramedullary tibial cutting jig was utilized making the appropriate cut using the anterior tibial crest as a reference building in appropriate posterior slope.  Care was taken during the cut to protect the medial and collateral ligaments.  The proximal tibia was removed along with the posterior horns of the menisci.  The PCL was sacrificed.    The extensor gap was measured and was approximately 65mm.    I completed the distal femoral preparation using the appropriate jig to prepare the box.  The patella was then measured, and cut with the saw.    The proximal tibia sized and prepared accordingly with the reamer and the punch, and then all components were trialed with the above sized poly insert.  The knee was found to have excellent balance and full motion.    The above named components were then impacted into place and Poly tibial piece and patella were inserted.  I was very happy with his stability and ROM  I performed a periarticular injection with marcaine and toradol  The knee was easily taken through a range of motion and the patella tracked well and the knee irrigated copiously and the parapatellar and subcutaneous tissue closed with vicryl, and monocryl with steri strips for the skin.  The  incision was dressed with sterile gauze and the tourniquet released and the patient was awakened and returned to the PACU in stable and satisfactory condition.  There were no complications.  Total tourniquet time was roughly 60 minutes.   POSTOPERATIVE PLAN: post op Abx, DVT px: SCD's, TED's, Early ambulation and chemical  px

## 2021-03-20 NOTE — Anesthesia Procedure Notes (Signed)
Procedure Name: MAC Date/Time: 03/20/2021 7:40 AM Performed by: Claudia Desanctis, CRNA Pre-anesthesia Checklist: Patient identified, Emergency Drugs available, Suction available, Patient being monitored and Timeout performed Patient Re-evaluated:Patient Re-evaluated prior to induction Oxygen Delivery Method: Simple face mask

## 2021-03-20 NOTE — Evaluation (Signed)
Physical Therapy Evaluation Patient Details Name: Colleen Glenn MRN: [id number] DOB: 11-15-1940 Today's Date: 03/20/2021   History of Present Illness  Pt is a 80 y/o female s/p RT TKA.  PMH includes NSTEMI with Taktosubo cardiomyopathy,  cardiac cath, BPPV and HTN, back surgery, reverse TSA  Clinical Impression  Pt is s/p TKA resulting in the deficits listed below (see PT Problem List).  Pt amb ` 30' with RW and min assist. Anticipate steady progress in acute setting.   Pt will benefit from skilled PT to increase their independence and safety with mobility to allow discharge to the venue listed below.      Follow Up Recommendations Follow surgeon's recommendation for DC plan and follow-up therapies    Equipment Recommendations  Rolling walker with 5" wheels    Recommendations for Other Services       Precautions / Restrictions Precautions Precautions: Fall Required Braces or Orthoses: Other Brace Other Brace: Cold pack knee brace with KI Restrictions Weight Bearing Restrictions: Yes RLE Weight Bearing: Weight bearing as tolerated      Mobility  Bed Mobility Overal bed mobility: Needs Assistance Bed Mobility: Supine to Sit     Supine to sit: Min assist;HOB elevated     General bed mobility comments: OOB with OT to recliner    Transfers Overall transfer level: Needs assistance Equipment used: Rolling walker (2 wheeled) Transfers: Sit to/from Stand Sit to Stand: Min assist Stand pivot transfers: Mod assist       General transfer comment: cues for hand placement and RLE position, incr time, uses momentum to stand  Ambulation/Gait Ambulation/Gait assistance: Min assist Gait Distance (Feet): 30 Feet Assistive device: Rolling walker (2 wheeled) Gait Pattern/deviations: Step-to pattern;Decreased stance time - left Gait velocity: decr   General Gait Details: cues for safety, sequence, use of UEs d/t slight R knee buckling with incr WBing  Stairs             Wheelchair Mobility    Modified Rankin (Stroke Patients Only)       Balance Overall balance assessment: Needs assistance;History of Falls Sitting-balance support: Single extremity supported;Feet supported Sitting balance-Leahy Scale: Good     Standing balance support: Bilateral upper extremity supported Standing balance-Leahy Scale: Poor Standing balance comment: reliant on UEs, d/t decr quad activiation RLE                             Pertinent Vitals/Pain Pain Assessment: 0-10 Pain Score: 8  Pain Location: RT knee Pain Intervention(s): Ice applied;Premedicated before session;Repositioned;Limited activity within patient's tolerance;Monitored during session    Home Living Family/patient expects to be discharged to:: Private residence Living Arrangements: Children (Discharging to daughter's home. Info below pertraining to dtr's home.) Available Help at Discharge: Family;Available 24 hours/day Type of Home: House Home Access: Ramped entrance     Home Layout: Two level;Full bath on main level;Able to live on main level with bedroom/bathroom Home Equipment: Shower seat;Walker - 2 wheels;Bedside commode;Wheelchair - [employer] - 4 wheels Additional Comments: Uses BSC over toilet. Daughter unsure whether WC is full manual or transport chair.    Prior Function Level of Independence: Needs assistance   Gait / Transfers Assistance Needed: Using Rollator at all times due to frequent falls.  ADL's / Homemaking Assistance Needed: Has been unable to drive awaiting knee surgery. Some light assistance due to pre-op knee pain.  Grandsons available to assist with housekeeping. Family brings or sends meals.  Hand Dominance   Dominant Hand: Right    Extremity/Trunk Assessment   Upper Extremity Assessment Upper Extremity Assessment: Overall WFL for tasks assessed    Lower Extremity Assessment Lower Extremity Assessment: LLE deficits/detail LLE Deficits  / Details: ankle WFL, knee extension/hip flexion 2+/5 with ~ 10 degree quad lag    Cervical / Trunk Assessment Cervical / Trunk Assessment: Normal  Communication   Communication: HOH  Cognition Arousal/Alertness: Awake/alert Behavior During Therapy: WFL for tasks assessed/performed Overall Cognitive Status: Within Functional Limits for tasks assessed                                        General Comments      Exercises Total Joint Exercises Ankle Circles/Pumps: AROM;Both;10 reps Quad Sets: AROM;5 reps;Both   Assessment/Plan    PT Assessment Patient needs continued PT services  PT Problem List Decreased strength;Decreased mobility;Decreased activity tolerance;Decreased range of motion;Pain;Decreased knowledge of use of DME       PT Treatment Interventions DME instruction;Therapeutic activities;Gait training;Functional mobility training;Therapeutic exercise;Patient/family education    PT Goals (Current goals can be found in the Care Plan section)  Acute Rehab PT Goals Patient Stated Goal: Home tomorrow. PT Goal Formulation: With patient Time For Goal Achievement: 03/27/21 Potential to Achieve Goals: Good    Frequency 7X/week   Barriers to discharge        Co-evaluation               AM-PAC PT "6 Clicks" Mobility  Outcome Measure Help needed turning from your back to your side while in a flat bed without using bedrails?: A Little Help needed moving from lying on your back to sitting on the side of a flat bed without using bedrails?: A Little Help needed moving to and from a bed to a chair (including a wheelchair)?: A Little Help needed standing up from a chair using your arms (e.g., wheelchair or bedside chair)?: A Little Help needed to walk in hospital room?: A Little Help needed climbing 3-5 steps with a railing? : A Lot 6 Click Score: 17    End of Session Equipment Utilized During Treatment: Gait belt;Right knee immobilizer Activity  Tolerance: Patient tolerated treatment well Patient left: in chair;with call bell/phone within reach;with family/visitor present;with chair alarm set Nurse Communication: Mobility status PT Visit Diagnosis: Other abnormalities of gait and mobility (R26.89);Difficulty in walking, not elsewhere classified (R26.2)    Time: 1610-9604 PT Time Calculation (min) (ACUTE ONLY): 24 min   Charges:   PT Evaluation $PT Eval Low Complexity: 1 Low PT Treatments $Gait Training: 8-22 mins        Baxter Flattery, PT  Acute Rehab Dept ([place]) [phone] Pager [phone]  03/20/2021   [facility] 03/20/2021, 3:16 PM

## 2021-03-20 NOTE — Anesthesia Procedure Notes (Signed)
Anesthesia Regional Block: Adductor canal block   Pre-Anesthetic Checklist: ,, timeout performed, Correct Patient, Correct Site, Correct Laterality, Correct Procedure, Correct Position, site marked, Risks and benefits discussed,  Surgical consent,  Pre-op evaluation,  At surgeon's request and post-op pain management  Laterality: Right  Prep: chloraprep       Needles:  Injection technique: Single-shot  Needle Type: Stimiplex     Needle Length: 9cm  Needle Gauge: 21     Additional Needles:   Procedures:,,,, ultrasound used (permanent image in chart),,,,  Narrative:  Start time: 03/20/2021 6:55 AM End time: 03/20/2021 7:16 AM Injection made incrementally with aspirations every 5 mL.  Performed by: Personally  Anesthesiologist: Nolon Nations, MD  Additional Notes: BP cuff, EKG monitors applied. Sedation begun. Artery and nerve location verified with U/S and anesthetic injected incrementally, slowly, and after negative aspirations under direct u/s guidance. Good fascial /perineural spread. Tolerated well.

## 2021-03-20 NOTE — Anesthesia Postprocedure Evaluation (Signed)
Anesthesia Post Note  Patient: Colleen Glenn  Procedure(s) Performed: TOTAL KNEE ARTHROPLASTY (Right Knee)     Patient location during evaluation: PACU Anesthesia Type: Spinal Level of consciousness: awake and alert Pain management: pain level controlled Vital Signs Assessment: post-procedure vital signs reviewed and stable Respiratory status: spontaneous breathing Cardiovascular status: stable Anesthetic complications: no   No complications documented.  Last Vitals:  Vitals:   03/20/21 1402 03/20/21 1403  BP: (!) 146/66   Pulse: 81 80  Resp: 16   Temp: 36.7 C   SpO2: 100% 100%    Last Pain:  Vitals:   03/20/21 1209  TempSrc: Oral  PainSc:                  Nolon Nations

## 2021-03-20 NOTE — Evaluation (Signed)
Occupational Therapy Evaluation Patient Details Name: Colleen Glenn MRN: [id number] DOB: 06-03-1941 Today's Date: 03/20/2021    History of Present Illness Pt is a 80 y/o female s/p RT TKA.  PMH includes NSTEMI with Taktosubo cardiomyopathy,  cardiac cath, BPPV and HTN, back surgery, reverse TSA   Clinical Impression   Patient is currently requiring assistance with ADLs including minimal to moderate assist with toileting, moderate assist with LE dressing, minimal to moderate assist with bathing, and setup assist with seated grooming and UE dressing, all of which is below patient's typical baseline of being Modified independent at home.  During this evaluation, patient was limited by post-op pain and RLE weakness, which has the potential to impact patient's safety and independence during functional mobility, as well as performance for ADLs. [place] "6-clicks" Daily Activity Inpatient Short Form score of 16/24 indicates 53.32% ADL impairment this session. Patient lives alone but will be discharging to her daughter's home, who is able to provide 24/7 supervision and assistance.  Patient demonstrates good rehab potential, and should benefit from continued skilled occupational therapy services while in acute care to maximize safety, independence and quality of life at home.  Continued occupational therapy services in the home is recommended.  ?  Follow Up Recommendations  Home health OT    Equipment Recommendations       Recommendations for Other Services       Precautions / Restrictions Precautions Precautions: Fall Required Braces or Orthoses: Other Brace Other Brace: Cold pack knee brace with KI Restrictions Weight Bearing Restrictions: Yes RLE Weight Bearing: Weight bearing as tolerated      Mobility Bed Mobility Overal bed mobility: Needs Assistance Bed Mobility: Supine to Sit     Supine to sit: Min assist;HOB elevated     General bed mobility comments:  Pt exited RT side of bed to simulate home mobility with RLE supported and cues for sequencing. Pt able to complete supine to sit, using bed rails as needed with Min As. Patient Response: Cooperative  Transfers Overall transfer level: Needs assistance   Transfers: Sit to/from [employer] Transfers Sit to Stand: Min assist Stand pivot transfers: Mod assist       General transfer comment: Pt did well with "1,2,3" count to stand from EOB with need of cues for hand and RLE placement and Minimal assist.  Pt had 2 incidences of RT knee buckle with turning with RW and need of Moderate assist to correct for safety. Pt improved with cues for sequencing and slowing down.    Balance Overall balance assessment: Needs assistance;History of Falls Sitting-balance support: Single extremity supported;Feet supported Sitting balance-Leahy Scale: Good     Standing balance support: Bilateral upper extremity supported Standing balance-Leahy Scale: Poor Standing balance comment: Reliant on RW, 2 times with knee buckle and need of Moderate assist to correct.                           ADL either performed or assessed with clinical judgement   ADL Overall ADL's : Needs assistance/impaired Eating/Feeding: Independent;Sitting Eating/Feeding Details (indicate cue type and reason): Recliner Grooming: Wash/dry face;Wash/dry hands;Sitting;Set up   Upper Body Bathing: Set up;Sitting   Lower Body Bathing: Moderate assistance;Sitting/lateral leans;Sit to/from stand   Upper Body Dressing : Set up;Sitting   Lower Body Dressing: Moderate assistance;Sitting/lateral leans;Sit to/from stand   Toilet Transfer: Moderate assistance;Minimal assistance Toilet Transfer Details (indicate cue type and reason): Pt ambulated in room with  RW and Mod As, progressing to [employer] x~20'.  Frequent cues for step sequence and proximity to RW. Descending to Recliner with Min As. Toileting- Clothing Manipulation and  Hygiene: Moderate assistance       Functional mobility during ADLs: Minimal assistance;Moderate assistance;Rolling walker;Cueing for sequencing;Cueing for safety       Vision Baseline Vision/History: Wears glasses Wears Glasses: At all times Patient Visual Report: No change from baseline Vision Assessment?: No apparent visual deficits     Perception     Praxis      Pertinent Vitals/Pain Pain Assessment: 0-10 Pain Score: 8  Pain Location: RT knee Pain Intervention(s): Ice applied;Premedicated before session;Repositioned;Limited activity within patient's tolerance;Monitored during session     Hand Dominance Right   Extremity/Trunk Assessment Upper Extremity Assessment Upper Extremity Assessment: Overall WFL for tasks assessed   Lower Extremity Assessment Lower Extremity Assessment: Defer to PT evaluation   Cervical / Trunk Assessment Cervical / Trunk Assessment: Normal   Communication Communication Communication: HOH   Cognition Arousal/Alertness: Awake/alert Behavior During Therapy: WFL for tasks assessed/performed Overall Cognitive Status: Within Functional Limits for tasks assessed                                     General Comments       Exercises     Shoulder Instructions      Home Living Family/patient expects to be discharged to:: Private residence Living Arrangements: Children (Discharging to daughter's home. Info below pertraining to dtr's home.) Available Help at Discharge: Family;Available 24 hours/day Type of Home: House Home Access: Ramped entrance     Home Layout: Two level;Full bath on main level;Able to live on main level with bedroom/bathroom     Bathroom Shower/Tub: Walk-in shower         Home Equipment: Clinical cytogeneticist - 2 wheels;Bedside commode;Wheelchair - [employer] - 4 wheels   Additional Comments: Uses BSC over toilet. Daughter unsure whether WC is full manual or transport chair.      Prior  Functioning/Environment Level of Independence: Needs assistance  Gait / Transfers Assistance Needed: Using Rollator at all times due to frequent falls. ADL's / Homemaking Assistance Needed: Has been unable to drive awaiting knee surgery. Some light assistance due to pre-op knee pain.  Grandsons available to assist with housekeeping. Family brings or sends meals.            OT Problem List: Pain;Increased edema;Decreased knowledge of use of DME or AE;Impaired balance (sitting and/or standing);Decreased range of motion      OT Treatment/Interventions: Self-care/ADL training;Therapeutic activities;Energy conservation;Patient/family education;DME and/or AE instruction;Balance training    OT Goals(Current goals can be found in the care plan section) Acute Rehab OT Goals Patient Stated Goal: Home tomorrow. OT Goal Formulation: With patient/family Time For Goal Achievement: 04/03/21 Potential to Achieve Goals: Good ADL Goals Pt Will Perform Lower Body Dressing: with modified independence;with adaptive equipment;sit to/from stand Pt Will Transfer to Toilet: with modified independence;ambulating Pt Will Perform Toileting - Clothing Manipulation and hygiene: with modified independence;sit to/from stand;sitting/lateral leans  OT Frequency: Min 3X/week   Barriers to D/C:    no known       Co-evaluation              AM-PAC OT "6 Clicks" Daily Activity     Outcome Measure Help from another person eating meals?: None Help from another person taking care of personal grooming?: A Little  Help from another person toileting, which includes using toliet, bedpan, or urinal?: A Lot Help from another person bathing (including washing, rinsing, drying)?: A Lot Help from another person to put on and taking off regular upper body clothing?: A Little Help from another person to put on and taking off regular lower body clothing?: A Lot 6 Click Score: 16   End of Session Equipment Utilized During  Treatment: Gait belt;Rolling walker Nurse Communication:  (RN cleared OT to see pt.)  Activity Tolerance: Patient tolerated treatment well;Patient limited by pain Patient left: in chair (Ice pack applied.)  OT Visit Diagnosis: Unsteadiness on feet (R26.81);Pain;Repeated falls (R29.6);History of falling (Z91.81) Pain - Right/Left: Right Pain - part of body: Knee                Time: 1324-4010 OT Time Calculation (min): 48 min Charges:  OT General Charges $OT Visit: 1 Visit OT Evaluation $OT Eval Low Complexity: 1 Low OT Treatments $Self Care/Home Management : 8-22 mins $Therapeutic Activity: 8-22 mins  Anderson Malta, OT Acute Rehab Services Office: [phone] 03/20/2021 Julien Girt 03/20/2021, 2:13 PM

## 2021-03-20 NOTE — Transfer of Care (Signed)
Immediate Anesthesia Transfer of Care Note  Patient: Marialena Wollen  Procedure(s) Performed: TOTAL KNEE ARTHROPLASTY (Right Knee)  Patient Location: PACU  Anesthesia Type:Spinal  Level of Consciousness: awake and patient cooperative  Airway & Oxygen Therapy: Patient Spontanous Breathing and Patient connected to face mask  Post-op Assessment: Report given to RN and Post -op Vital signs reviewed and stable  Post vital signs: Reviewed and stable  Last Vitals:  Vitals Value Taken Time  BP 106/61 03/20/21 0923  Temp    Pulse 73 03/20/21 0925  Resp 13 03/20/21 0925  SpO2 100 % 03/20/21 0925  Vitals shown include unvalidated device data.  Last Pain:  Vitals:   03/20/21 0540  TempSrc: Oral         Complications: No complications documented.

## 2021-03-21 ENCOUNTER — Encounter (HOSPITAL_COMMUNITY): Payer: Self-pay | Admitting: Orthopedic Surgery

## 2021-03-21 MED ORDER — ASPIRIN EC 81 MG PO TBEC: 81.0000 mg | DELAYED_RELEASE_TABLET | Freq: Two times a day (BID) | ORAL | 0 refills | Status: DC

## 2021-03-21 MED ORDER — OXYCODONE HCL 5 MG PO TABS: 5.0000 mg | ORAL_TABLET | Freq: Four times a day (QID) | ORAL | 0 refills | Status: DC | PRN

## 2021-03-21 MED ORDER — METHOCARBAMOL 500 MG PO TABS: 500.0000 mg | ORAL_TABLET | Freq: Three times a day (TID) | ORAL | 0 refills | Status: DC | PRN

## 2021-03-21 MED ORDER — ACETAMINOPHEN 500 MG PO TABS: 1000.0000 mg | ORAL_TABLET | Freq: Four times a day (QID) | ORAL | 0 refills | Status: AC | PRN

## 2021-03-21 MED ORDER — ONDANSETRON HCL 4 MG PO TABS: 4.0000 mg | ORAL_TABLET | Freq: Every day | ORAL | 0 refills | Status: DC | PRN

## 2021-03-21 NOTE — Progress Notes (Signed)
03/21/21 1400  PT Visit Information  Last PT Received On 03/21/21  Feel pt may need another night however she declines stating she wants to go home and see her dog; pt will need assist with all mobility to prevent falls; pt reports her dtr will be able to provide assist. Advised pt to use gait belt at home for added safety   Assistance Needed +1  History of Present Illness Pt is a 80 y/o female s/p RT TKA.  PMH includes NSTEMI with Taktosubo cardiomyopathy,  cardiac cath, BPPV and HTN, back surgery, reverse TSA  Subjective Data  Patient Stated Goal Home tomorrow.  Precautions  Precautions Fall  Required Braces or Orthoses Knee Immobilizer - Right  Restrictions  RLE Weight Bearing WBAT  Pain Assessment  Pain Assessment 0-10  Pain Score 6  Pain Location RT knee with exercises  Pain Descriptors / Indicators Grimacing;Discomfort;Operative site guarding;Spasm  Pain Intervention(s) Limited activity within patient's tolerance;Monitored during session;Premedicated before session;Repositioned;RN gave pain meds during session  Cognition  Arousal/Alertness Awake/alert  Behavior During Therapy WFL for tasks assessed/performed  Overall Cognitive Status Impaired/Different from baseline  Area of Impairment Following commands;Safety/judgement;Awareness;Problem solving;Memory;Attention  Current Attention Level Sustained (easily distracted)  Memory Decreased recall of precautions  Following Commands Follows one step commands with increased time;Follows multi-step commands inconsistently  Safety/Judgement Decreased awareness of safety;Decreased awareness of deficits  Problem Solving Difficulty sequencing;Requires verbal cues;Requires tactile cues  General Comments ?intermittent confusion--pt answering call bell as if it was her cell phone  Bed Mobility  Overal bed mobility Needs Assistance  Bed Mobility Supine to Sit;Sit to Supine  Supine to sit Min assist  Sit to supine Min guard  General bed  mobility comments incr time, assist with RLE off bed; able to use gait belt as leg lifter to bring RLE on to bed; cues for technique  Transfers  Overall transfer level Needs assistance  Equipment used Rolling walker (2 wheeled)  Transfers Sit to/from Stand  Sit to Stand Min assist  General transfer comment cues for hand placement and RLE position, incr time. assist to rise and transition to RW  Ambulation/Gait  Ambulation/Gait assistance Min assist  Gait Distance (Feet) 15 Feet (x2)  Assistive device Rolling walker (2 wheeled)  Gait Pattern/deviations Step-to pattern;Decreased stance time - left  General Gait Details cues for sequence, RW position, safety with turns,  assist to rise and transition to RW  Gait velocity decr  Balance  Overall balance assessment History of Falls  Sitting balance-Leahy Scale Good  Standing balance support Bilateral upper extremity supported  Standing balance-Leahy Scale Poor  Standing balance comment reliant on UEs  PT - End of Session  Equipment Utilized During Treatment Gait belt;Right knee immobilizer  Activity Tolerance Patient tolerated treatment well  Patient left in chair;with call bell/phone within reach;with chair alarm set  Nurse Communication Mobility status   PT - Assessment/Plan  PT Plan Current plan remains appropriate  PT Visit Diagnosis Other abnormalities of gait and mobility (R26.89);Difficulty in walking, not elsewhere classified (R26.2)  PT Frequency (ACUTE ONLY) 7X/week  Follow Up Recommendations Follow surgeon's recommendation for DC plan and follow-up therapies;Supervision for mobility/OOB  PT equipment Rolling walker with 5" wheels  AM-PAC PT "6 Clicks" Mobility Outcome Measure (Version 2)  Help needed turning from your back to your side while in a flat bed without using bedrails? 3  Help needed moving from lying on your back to sitting on the side of a flat bed without using bedrails? 3  Help needed moving to and from a bed to  a chair (including a wheelchair)? 3  Help needed standing up from a chair using your arms (e.g., wheelchair or bedside chair)? 3  Help needed to walk in hospital room? 3  Help needed climbing 3-5 steps with a railing?  2  6 Click Score 17  Consider Recommendation of Discharge To: Home with [facility]  PT Goal Progression  Progress towards PT goals Progressing toward goals  Acute Rehab PT Goals  PT Goal Formulation With patient  Time For Goal Achievement 03/27/21  Potential to Achieve Goals Good  PT Time Calculation  PT Start Time (ACUTE ONLY) 1428  PT Stop Time (ACUTE ONLY) 1455  PT Time Calculation (min) (ACUTE ONLY) 27 min  PT General Charges  $$ ACUTE PT VISIT 1 Visit  PT Treatments  $Gait Training 8-22 mins  $Therapeutic Activity 8-22 mins

## 2021-03-21 NOTE — Discharge Instructions (Signed)
POST-OPERATIVE OPIOID TAPER INSTRUCTIONS: . It is important to wean off of your opioid medication as soon as possible. If you do not need pain medication after your surgery it is ok to stop day one. . Opioids include: o Codeine, Hydrocodone(Norco, Vicodin), Oxycodone(Percocet, oxycontin) and hydromorphone amongst others.  . Long term and even short term use of opiods can cause: o Increased pain response o Dependence o Constipation o Depression o Respiratory depression o And more.  . Withdrawal symptoms can include o Flu like symptoms o Nausea, vomiting o And more . Techniques to manage these symptoms o Hydrate well o Eat regular healthy meals o Stay active o Use relaxation techniques(deep breathing, meditating, yoga) . Do Not substitute Alcohol to help with tapering . If you have been on opioids for less than two weeks and do not have pain than it is ok to stop all together.  . Plan to wean off of opioids o This plan should start within one week post op of your joint replacement. o Maintain the same interval or time between taking each dose and first decrease the dose.  o Cut the total daily intake of opioids by one tablet each day o Next start to increase the time between doses. o The last dose that should be eliminated is the evening dose.    

## 2021-03-21 NOTE — TOC Transition Note (Signed)
Transition of Care [facility]) - CM/SW Discharge Note  Patient Details  Name: Colleen Glenn MRN: [id number] Date of Birth: [birth year]  [facility]) CM/SW Contact:  Sherie Don, LCSW Phone Number: 03/21/2021, 9:56 AM  Clinical Narrative: Patient is expected to discharge after working with PT. CSW met with patient to review discharge plan. Per patient, she will discharge to her daughter's home and will start with HHPT/OT through [facility]. Patient will then transition to OPPT at [place] [facility]. Patient has a rollator, rolling walker, and 3N1 at home. She will need a CPM, which will be set up by [employer] at her daughter's home after discharge. TOC signing off.  Final next level of care: [place] Barriers to Discharge: No Barriers Identified  Patient Goals and CMS Choice Patient states their goals for this hospitalization and ongoing recovery are:: Discharge to daughter's home and start [place] CMS Medicare.gov Compare Post Acute Care list provided to:: Patient Choice offered to / list presented to : Patient  Discharge Plan and Services        DME Arranged: CPM DME Agency: [employer] Representative spoke with at DME Agency: Prearranged in orthopedist's office HH Arranged: PT,OT [place]: [facility] Representative spoke with at [place]: Prearranged in orthopedist's office  Readmission Risk Interventions Readmission Risk Prevention Plan 10/03/2020  Post Dischage Appt Complete  Medication Screening Complete  Transportation Screening Complete  Some recent data might be hidden

## 2021-03-21 NOTE — Discharge Summary (Deleted)
Physician Discharge Summary  Patient ID: Colleen Glenn MRN: [id number] DOB/AGE: [birth year] 80 y.o.  Admit date: 03/20/2021 Discharge date: 03/21/2021  Admission Diagnoses: right knee osteoarthritis  Discharge Diagnoses:  Active Problems:   S/P total knee arthroplasty, right   Discharged Condition: fair  Hospital Course: Patient underwent a right TKA by Dr. Percell Miller on 1/60/10 with no complications. She spent the night in observation and passed her PT evaluation. She is ready to go home.  Consults: None  Significant Diagnostic Studies: none  Treatments: IV hydration, antibiotics: Ancef, analgesia: acetaminophen, Vicodin, Dilaudid, Morphine and Oxycodone, anticoagulation: ASA and surgery: right TKA  Discharge Exam: Blood pressure 122/76, pulse 79, temperature 97.7 F (36.5 C), temperature source Oral, resp. rate 16, height 5\' 5"  (1.651 m), weight 70.5 kg, SpO2 97 %. General appearance: alert, cooperative and no distress Head: Normocephalic, without obvious abnormality, atraumatic Eyes: conjunctivae/corneas clear. PERRL, EOM's intact. Fundi benign. Resp: clear to auscultation bilaterally Cardio: regular rate and rhythm, S1, S2 normal, no murmur, click, rub or gallop GI: soft, non-tender; bowel sounds normal; no masses,  no organomegaly Extremities: extremities normal, atraumatic, no cyanosis or edema Pulses: 2+ and symmetric Neurologic: Alert and oriented X 3, normal strength and tone. Normal symmetric reflexes. Normal coordination and gait Incision/Wound: c/d/i, covered with Ace wrap  Disposition: Discharge disposition: 01-Home or Self Care       Discharge Instructions    Call MD / Call 911   Complete by: As directed    If you experience chest pain or shortness of breath, CALL 911 and be transported to the hospital emergency room.  If you develope a fever above 101 F, pus (white drainage) or increased drainage or redness at the wound, or calf pain, call your surgeon's  office.   Diet - low sodium heart healthy   Complete by: As directed    Discharge instructions   Complete by: As directed    You may bear weight as tolerated. Keep your dressing on and dry until follow up. Take medicine to prevent blood clots as directed. Take pain medicine as needed with the goal of transitioning to over the counter medicines.    INSTRUCTIONS AFTER JOINT REPLACEMENT   Remove items at home which could result in a fall. This includes throw rugs or furniture in walking pathways ICE to the affected joint every three hours while awake for 30 minutes at a time, for at least the first 3-5 days, and then as needed for pain and swelling.  Continue to use ice for pain and swelling. You may notice swelling that will progress down to the foot and ankle.  This is normal after surgery.  Elevate your leg when you are not up walking on it.   Continue to use the breathing machine you got in the hospital (incentive spirometer) which will help keep your temperature down.  It is common for your temperature to cycle up and down following surgery, especially at night when you are not up moving around and exerting yourself.  The breathing machine keeps your lungs expanded and your temperature down.   DIET:  As you were doing prior to hospitalization, we recommend a well-balanced diet.  DRESSING / WOUND CARE / SHOWERING  You may shower 3 days after surgery, but keep the wounds dry during showering.  You may use an occlusive plastic wrap (Press'n Seal for example) with blue painter's tape at edges, NO SOAKING/SUBMERGING IN THE BATHTUB.  If the bandage gets wet, call the office.  ACTIVITY  Increase activity slowly as tolerated, but follow the weight bearing instructions below.   No driving for 6 weeks or until further direction given by your physician.  You cannot drive while taking narcotics.  No lifting or carrying greater than 10 lbs. until further directed by your surgeon. Avoid periods of  inactivity such as sitting longer than an hour when not asleep. This helps prevent blood clots.  You may return to work once you are authorized by your doctor.    WEIGHT BEARING   Weight bearing as tolerated with assist device (walker, cane, etc) as directed, use it as long as suggested by your surgeon or therapist, typically at least 4-6 weeks.   EXERCISES  Results after joint replacement surgery are often greatly improved when you follow the exercise, range of motion and muscle strengthening exercises prescribed by your doctor. Safety measures are also important to protect the joint from further injury. Any time any of these exercises cause you to have increased pain or swelling, decrease what you are doing until you are comfortable again and then slowly increase them. If you have problems or questions, call your caregiver or physical therapist for advice.   Rehabilitation is important following a joint replacement. After just a few days of immobilization, the muscles of the leg can become weakened and shrink (atrophy).  These exercises are designed to build up the tone and strength of the thigh and leg muscles and to improve motion. Often times heat used for twenty to thirty minutes before working out will loosen up your tissues and help with improving the range of motion but do not use heat for the first two weeks following surgery (sometimes heat can increase post-operative swelling).   These exercises can be done on a training (exercise) mat, on the floor, on a table or on a bed. Use whatever works the best and is most comfortable for you.    Use music or television while you are exercising so that the exercises are a pleasant break in your day. This will make your life better with the exercises acting as a break in your routine that you can look forward to.   Perform all exercises about fifteen times, three times per day or as directed.  You should exercise both the operative leg and the other  leg as well.  Exercises include:   Quad Sets - Tighten up the muscle on the front of the thigh (Quad) and hold for 5-10 seconds.   Straight Leg Raises - With your knee straight (if you were given a brace, keep it on), lift the leg to 60 degrees, hold for 3 seconds, and slowly lower the leg.  Perform this exercise against resistance later as your leg gets stronger.  Leg Slides: Lying on your back, slowly slide your foot toward your buttocks, bending your knee up off the floor (only go as far as is comfortable). Then slowly slide your foot back down until your leg is flat on the floor again.  Angel Wings: Lying on your back spread your legs to the side as far apart as you can without causing discomfort.  Hamstring Strength:  Lying on your back, push your heel against the floor with your leg straight by tightening up the muscles of your buttocks.  Repeat, but this time bend your knee to a comfortable angle, and push your heel against the floor.  You may put a pillow under the heel to make it more comfortable if necessary.   A  rehabilitation program following joint replacement surgery can speed recovery and prevent re-injury in the future due to weakened muscles. Contact your doctor or a physical therapist for more information on knee rehabilitation.    CONSTIPATION  Constipation is defined medically as fewer than three stools per week and severe constipation as less than one stool per week.  Even if you have a regular bowel pattern at home, your normal regimen is likely to be disrupted due to multiple reasons following surgery.  Combination of anesthesia, postoperative narcotics, change in appetite and fluid intake all can affect your bowels.   YOU MUST use at least one of the following options; they are listed in order of increasing strength to get the job done.  They are all available over the counter, and you may need to use some, POSSIBLY even all of these options:    Drink plenty of fluids  (prune juice may be helpful) and high fiber foods Colace 100 mg by mouth twice a day  Senokot for constipation as directed and as needed Dulcolax (bisacodyl), take with full glass of water  Miralax (polyethylene glycol) once or twice a day as needed.  If you have tried all these things and are unable to have a bowel movement in the first 3-4 days after surgery call either your surgeon or your primary doctor.    If you experience loose stools or diarrhea, hold the medications until you stool forms back up.  If your symptoms do not get better within 1 week or if they get worse, check with your doctor.  If you experience "the worst abdominal pain ever" or develop nausea or vomiting, please contact the office immediately for further recommendations for treatment.   ITCHING:  If you experience itching with your medications, try taking only a single pain pill, or even half a pain pill at a time.  You can also use Benadryl over the counter for itching or also to help with sleep.   TED HOSE STOCKINGS:  Use stockings on both legs until for at least 2 weeks or as directed by physician office. They may be removed at night for sleeping.  MEDICATIONS:  See your medication summary on the "After Visit Summary" that nursing will review with you.  You may have some home medications which will be placed on hold until you complete the course of blood thinner medication.  It is important for you to complete the blood thinner medication as prescribed.  Take medicines as prescribed.   You have several different medicines that work in different ways. - Tylenol is for mild to moderate pain. Try to take this medicine before turning to your narcotic medicines.  - Gabapentin is to reduce pain / inflammation - Robaxin is for muscle spasms - Oxycodone is a narcotic pain medicine.  Take this for severe pain. This medicine can be dehydrating / constipating. - Zofran is for nausea and vomiting. - Aspirin is to prevent blood  clots after surgery.   PRECAUTIONS:  If you experience chest pain or shortness of breath - call 911 immediately for transfer to the hospital emergency department.   If you develop a fever greater that 101 F, purulent drainage from wound, increased redness or drainage from wound, foul odor from the wound/dressing, or calf pain - CONTACT YOUR SURGEON.  FOLLOW-UP APPOINTMENTS:  If you do not already have a post-op appointment, please call the office [phone] for an appointment to be seen by Dr. Percell Miller in 2 weeks.   OTHER INSTRUCTIONS:   MAKE SURE YOU:  Understand these instructions.  Get help right away if you are not doing well or get worse.    Thank you for letting us be a part of your medical care team.  It is a privilege we respect greatly.  We hope these instructions will help you stay on track for a fast and full recovery!   Do not put a pillow under the knee. Place it under the heel.   Complete by: As directed    Post-operative opioid taper instructions:   Complete by: As directed    POST-OPERATIVE OPIOID TAPER INSTRUCTIONS: It is important to wean off of your opioid medication as soon as possible. If you do not need pain medication after your surgery it is ok to stop day one. Opioids include: Codeine, Hydrocodone(Norco, Vicodin), Oxycodone(Percocet, oxycontin) and hydromorphone amongst others.  Long term and even short term use of opiods can cause: Increased pain response Dependence Constipation Depression Respiratory depression And more.  Withdrawal symptoms can include Flu like symptoms Nausea, vomiting And more Techniques to manage these symptoms Hydrate well Eat regular healthy meals Stay active Use relaxation techniques(deep breathing, meditating, yoga) Do Not substitute Alcohol to help with tapering If you have been on opioids for less than two weeks and do not have pain than it is ok to stop all together.  Plan  to wean off of opioids This plan should start within one week post op of your joint replacement. Maintain the same interval or time between taking each dose and first decrease the dose.  Cut the total daily intake of opioids by one tablet each day Next start to increase the time between doses. The last dose that should be eliminated is the evening dose.      Weight bearing as tolerated   Complete by: As directed    Laterality: right   Extremity: Lower     Allergies as of 03/21/2021   No Known Allergies     Medication List    STOP taking these medications   cyclobenzaprine 5 MG tablet Commonly known as: FLEXERIL   naproxen sodium 220 MG tablet Commonly known as: ALEVE   ondansetron 8 MG disintegrating tablet Commonly known as: ZOFRAN-ODT   polyethylene glycol powder 17 GM/SCOOP powder Commonly known as: GLYCOLAX/MIRALAX   promethazine 12.5 MG tablet Commonly known as: PHENERGAN     TAKE these medications   acetaminophen 500 MG tablet Commonly known as: TYLENOL Take 2 tablets (1,000 mg total) by mouth every 6 (six) hours as needed for mild pain or moderate pain. What changed:   medication strength  how much to take  reasons to take this   aspirin EC 81 MG tablet Take 1 tablet (81 mg total) by mouth 2 (two) times daily. For DVT prophylaxis for 30 days after surgery. What changed:   when to take this  additional instructions   buPROPion 300 MG 24 hr tablet Commonly known as: WELLBUTRIN XL Take 300 mg by mouth daily before breakfast.   carvedilol 12.5 MG tablet Commonly known as: COREG Take 1 tablet (12.5 mg total) by mouth 2 (two) times daily with a meal.   donepezil 10 MG tablet Commonly known as: ARICEPT Take 10 mg by mouth at bedtime.   DULoxetine 60 MG capsule Commonly known as:  CYMBALTA Take 60 mg by mouth daily.   ezetimibe 10 MG tablet Commonly known as: ZETIA Take 10 mg by mouth daily.   gabapentin 300 MG capsule Commonly known as:  NEURONTIN TAKE 1 CAPSULE(300 MG) BY MOUTH AT BEDTIME What changed: See the new instructions.   methocarbamol 500 MG tablet Commonly known as: Robaxin Take 1 tablet (500 mg total) by mouth every 8 (eight) hours as needed for muscle spasms.   Myrbetriq 50 MG Tb24 tablet Generic drug: mirabegron ER Take 50 mg by mouth daily.   ondansetron 4 MG tablet Commonly known as: Zofran Take 1 tablet (4 mg total) by mouth daily as needed for nausea or vomiting.   oxyCODONE 5 MG immediate release tablet Commonly known as: Roxicodone Take 1 tablet (5 mg total) by mouth every 6 (six) hours as needed for severe pain. Do not take more than 6 tablets in a 24 hour period.   pantoprazole 40 MG tablet Commonly known as: PROTONIX TAKE 1 TABLET (40 MG TOTAL) BY MOUTH DAILY AT 6AM. What changed:   how much to take  how to take this  when to take this   raloxifene 60 MG tablet Commonly known as: EVISTA Take 60 mg by mouth daily.   simvastatin 40 MG tablet Commonly known as: ZOCOR Take 40 mg by mouth daily.   vitamin B-12 1000 MCG tablet Commonly known as: CYANOCOBALAMIN Take 1,000 mcg by mouth daily.   Vitamin D-3 125 MCG (5000 UT) Tabs Take 5,000 Units by mouth daily after breakfast.            Discharge Care Instructions  (From admission, onward)         Start     Ordered   03/21/21 0000  Weight bearing as tolerated       Question Answer Comment  Laterality right   Extremity Lower      03/21/21 0923          Follow-up Information    Renette Butters, MD. Go on 04/04/2021.   Specialty: Orthopedic Surgery Why: your appointment is scheduled for 11:15.  Contact information: [address] [place] [address] [phone]        [facility] Follow up.   Specialty: [place] Why: HHPT will provide 5 visits at home prior to you starting outpatient physical therapy  Contact information: [place]  [place] [address] [phone]        [facility], [place]. Go on 04/04/2021.   Why: Your appointment has been scheduled for 12:00. Please go over to the therapy side as soon as your MD appointment is complete  Contact information: [facility] [place] [address] [phone]               Signed: Alisa Graff 03/21/2021, 9:23 AM

## 2021-03-21 NOTE — Progress Notes (Signed)
Physical Therapy Treatment Patient Details Name: Colleen Glenn MRN: [id number] DOB: 1941-08-24 Today's Date: 03/21/2021    History of Present Illness Pt is a 80 y/o female s/p RT TKA.  PMH includes NSTEMI with Taktosubo cardiomyopathy,  cardiac cath, BPPV and HTN, back surgery, reverse TSA    PT Comments    Pt progressing however still requiring assist and cues for safe amb. Will see for a second session this pm. Pt is hopeful to d/c later today     Follow Up Recommendations  Follow surgeon's recommendation for DC plan and follow-up therapies     Equipment Recommendations  Rolling walker with 5" wheels    Recommendations for Other Services       Precautions / Restrictions Precautions Precautions: Fall Required Braces or Orthoses: Knee Immobilizer - Right Restrictions Weight Bearing Restrictions: No RLE Weight Bearing: Weight bearing as tolerated    Mobility  Bed Mobility Overal bed mobility: Needs Assistance Bed Mobility: Supine to Sit     Supine to sit: Min assist     General bed mobility comments: incr time, assist with RLE    Transfers Overall transfer level: Needs assistance Equipment used: Rolling walker (2 wheeled) Transfers: Sit to/from Stand Sit to Stand: Min assist         General transfer comment: cues for hand placement and RLE position, incr time, uses momentum to stand  Ambulation/Gait Ambulation/Gait assistance: Herbalist (Feet): 50 Feet Assistive device: Rolling walker (2 wheeled) Gait Pattern/deviations: Step-to pattern;Decreased stance time - left Gait velocity: decr   General Gait Details: cues for sequence, RW position, safety with turns   Chief Strategy Officer    Modified Rankin (Stroke Patients Only)       Balance Overall balance assessment: History of Falls   Sitting balance-Leahy Scale: Good     Standing balance support: Bilateral upper extremity supported Standing  balance-Leahy Scale: Poor Standing balance comment: reliant on UEs                            Cognition Arousal/Alertness: Awake/alert Behavior During Therapy: WFL for tasks assessed/performed Overall Cognitive Status: Within Functional Limits for tasks assessed                                        Exercises Total Joint Exercises Ankle Circles/Pumps: AROM;Both;10 reps Quad Sets: AROM;Both;10 reps Heel Slides: AAROM;Right;10 reps Hip ABduction/ADduction: AROM;Right;5 reps Straight Leg Raises: AAROM;Right;5 reps    General Comments        Pertinent Vitals/Pain Pain Assessment: 0-10 Pain Score: 5  Pain Location: RT knee with exercises Pain Descriptors / Indicators: Grimacing;Discomfort;Operative site guarding Pain Intervention(s): Limited activity within patient's tolerance;Monitored during session;Repositioned    Home Living                      Prior Function            PT Goals (current goals can now be found in the care plan section) Acute Rehab PT Goals Patient Stated Goal: Home tomorrow. PT Goal Formulation: With patient Time For Goal Achievement: 03/27/21 Potential to Achieve Goals: Good Progress towards PT goals: Progressing toward goals    Frequency    7X/week      PT Plan Current plan remains appropriate  Co-evaluation              AM-PAC PT "6 Clicks" Mobility   Outcome Measure  Help needed turning from your back to your side while in a flat bed without using bedrails?: A Little Help needed moving from lying on your back to sitting on the side of a flat bed without using bedrails?: A Little Help needed moving to and from a bed to a chair (including a wheelchair)?: A Little Help needed standing up from a chair using your arms (e.g., wheelchair or bedside chair)?: A Little Help needed to walk in hospital room?: A Little Help needed climbing 3-5 steps with a railing? : A Lot 6 Click Score: 17    End  of Session Equipment Utilized During Treatment: Gait belt;Right knee immobilizer Activity Tolerance: Patient tolerated treatment well Patient left: in chair;with call bell/phone within reach;with chair alarm set Nurse Communication: Mobility status PT Visit Diagnosis: Other abnormalities of gait and mobility (R26.89);Difficulty in walking, not elsewhere classified (R26.2)     Time: 9935-7017 PT Time Calculation (min) (ACUTE ONLY): 28 min  Charges:  $Gait Training: 8-22 mins $Therapeutic Exercise: 8-22 mins                     Baxter Flattery, PT  Acute Rehab Dept ([place]) [phone] Pager [phone]  03/21/2021    [facility] 03/21/2021, 12:12 PM

## 2021-03-21 NOTE — Progress Notes (Signed)
    Subjective: Patient reports pain as mild to moderate. Controlled with medicine. Tolerating diet. Urinating. No CP, SOB. Has worked with PT/OT on mobilizing OOB and did well.  Objective:   VITALS:   Vitals:   03/20/21 1722 03/20/21 2301 03/21/21 0133 03/21/21 0600  BP: (!) 145/72 (!) 120/58 (!) 141/74 (!) 149/59  Pulse: 82 72 73 72  Resp: 16 16 16 16   Temp: 98.5 F (36.9 C) 98.5 F (36.9 C) 97.9 F (36.6 C) 97.7 F (36.5 C)  TempSrc: Oral Oral Oral Oral  SpO2: 100% 96% 97%   Weight:      Height:       CBC Latest Ref Rng & Units 03/08/2021 10/02/2020 10/01/2020  WBC 4.0 - 10.5 K/uL 5.0 6.6 8.7  Hemoglobin 12.0 - 15.0 g/dL 12.1 12.5 13.2  Hematocrit 36.0 - 46.0 % 39.3 37.7 39.3  Platelets 150 - 400 K/uL 209 221 226   BMP Latest Ref Rng & Units 03/08/2021 10/25/2020 10/03/2020  Glucose 70 - 99 mg/dL 100(H) 92 88  BUN 8 - 23 mg/dL 24(H) 12 21  Creatinine 0.44 - 1.00 mg/dL 0.72 0.80 1.01(H)  BUN/Creat Ratio 12 - 28 - 15 -  Sodium 135 - 145 mmol/L 143 143 137  Potassium 3.5 - 5.1 mmol/L 4.3 4.4 3.7  Chloride 98 - 111 mmol/L 108 105 100  CO2 22 - 32 mmol/L 31 22 28   Calcium 8.9 - 10.3 mg/dL 9.0 9.0 8.8(L)   Intake/Output      05/31 0701 06/01 0700 06/01 0701 06/02 0700   P.O. 720    I.V. (mL/kg) 1292.5 (18.3)    IV Piggyback 360    Total Intake(mL/kg) 2372.5 (33.7)    Urine (mL/kg/hr) 2500 (1.5)    Blood 20    Total Output 2520    Net -147.6            Physical Exam: General: NAD.  Laying in bed. Comfortable Resp: No increased wob Cardio: regular rate and rhythm ABD soft Neurologically intact MSK Neurovascularly intact Sensation intact distally Intact pulses distally Dorsiflexion/Plantar flexion intact Incision: dressing C/D/I   Assessment: 1 Day Post-Op  S/P Procedure(s) (LRB): TOTAL KNEE ARTHROPLASTY (Right) by Dr. Ernesta Amble. Percell Miller on 03/20/21  Active Problems:   S/P total knee arthroplasty, right   Plan: Advance diet Up with  therapy Incentive Spirometry Elevate and Apply ice  Weightbearing: WBAT RLE Insicional and dressing care: Dressings left intact until follow-up and Reinforce dressings as needed Orthopedic device(s): None and KI that can be d/c within 72 hours post-op Showering: Keep dressing dry VTE prophylaxis: Aspirin 81mg  BID x 30 days postop, SCDs, ambulation Pain control: Continue current regimen Follow - up plan: 2 weeks Contact information:  Edmonia Lynch MD, Aggie Moats PA-C  Dispo: Home later today   Colleen Glenn Office [phone] 03/21/2021, 8:11 AM

## 2021-03-21 NOTE — Plan of Care (Signed)
  Problem: Clinical Measurements: Goal: Postoperative complications will be avoided or minimized Outcome: Progressing   Problem: Pain Management: Goal: Pain level will decrease with appropriate interventions Outcome: Progressing   

## 2021-03-22 DIAGNOSIS — Z20822 Contact with and (suspected) exposure to covid-19: Secondary | ICD-10-CM | POA: Diagnosis present

## 2021-03-22 DIAGNOSIS — E785 Hyperlipidemia, unspecified: Secondary | ICD-10-CM | POA: Diagnosis present

## 2021-03-22 DIAGNOSIS — I252 Old myocardial infarction: Secondary | ICD-10-CM | POA: Diagnosis not present

## 2021-03-22 DIAGNOSIS — Z90710 Acquired absence of both cervix and uterus: Secondary | ICD-10-CM | POA: Diagnosis not present

## 2021-03-22 DIAGNOSIS — Z7982 Long term (current) use of aspirin: Secondary | ICD-10-CM | POA: Diagnosis not present

## 2021-03-22 DIAGNOSIS — Z96641 Presence of right artificial hip joint: Secondary | ICD-10-CM | POA: Diagnosis present

## 2021-03-22 DIAGNOSIS — F32A Depression, unspecified: Secondary | ICD-10-CM | POA: Diagnosis present

## 2021-03-22 DIAGNOSIS — M1711 Unilateral primary osteoarthritis, right knee: Secondary | ICD-10-CM | POA: Diagnosis present

## 2021-03-22 DIAGNOSIS — Z8673 Personal history of transient ischemic attack (TIA), and cerebral infarction without residual deficits: Secondary | ICD-10-CM | POA: Diagnosis not present

## 2021-03-22 DIAGNOSIS — E559 Vitamin D deficiency, unspecified: Secondary | ICD-10-CM | POA: Diagnosis present

## 2021-03-22 DIAGNOSIS — Z87891 Personal history of nicotine dependence: Secondary | ICD-10-CM | POA: Diagnosis not present

## 2021-03-22 DIAGNOSIS — N3281 Overactive bladder: Secondary | ICD-10-CM | POA: Diagnosis present

## 2021-03-22 DIAGNOSIS — F410 Panic disorder [episodic paroxysmal anxiety] without agoraphobia: Secondary | ICD-10-CM | POA: Diagnosis present

## 2021-03-22 DIAGNOSIS — I5181 Takotsubo syndrome: Secondary | ICD-10-CM | POA: Diagnosis present

## 2021-03-22 DIAGNOSIS — I10 Essential (primary) hypertension: Secondary | ICD-10-CM | POA: Diagnosis present

## 2021-03-22 DIAGNOSIS — K219 Gastro-esophageal reflux disease without esophagitis: Secondary | ICD-10-CM | POA: Diagnosis present

## 2021-03-22 DIAGNOSIS — Z79899 Other long term (current) drug therapy: Secondary | ICD-10-CM | POA: Diagnosis not present

## 2021-03-22 NOTE — Progress Notes (Signed)
Physical Therapy Treatment Patient Details Name: Colleen Glenn MRN: [id number] DOB: [birth year] Today's Date: 03/22/2021    History of Present Illness Pt is a 80 y/o female s/p RT TKA.  PMH includes NSTEMI with Taktosubo cardiomyopathy,  cardiac cath, BPPV and HTN, back surgery, reverse TSA    PT Comments    POD #2 am session Assisted OOB to amb to and from bathroom.  Pt required increased time and VC's for safety.  Slightly "foggy" in judgement.  Pt plans to D/C to her daughters home.   Follow Up Recommendations  Follow surgeon's recommendation for DC plan and follow-up therapies;Supervision for mobility/OOB     Equipment Recommendations  Rolling walker with 5" wheels    Recommendations for Other Services       Precautions / Restrictions Precautions Precautions: Fall Precaution Comments: no pillow under knee Required Braces or Orthoses: Knee Immobilizer - Right Other Brace: Cold pack knee brace with KI Restrictions Weight Bearing Restrictions: No RLE Weight Bearing: Weight bearing as tolerated    Mobility  Bed Mobility Overal bed mobility: Needs Assistance Bed Mobility: Supine to Sit     Supine to sit: Supervision     General bed mobility comments: demonstarted and instructed how to use a belt to self assist LE    Transfers Overall transfer level: Needs assistance Equipment used: Rolling walker (2 wheeled) Transfers: Sit to/from Stand Sit to Stand: Min guard Stand pivot transfers: Min guard;Min assist       General transfer comment: Min guard to stand and ambulate to bathroom and then to recliner with RW 50% VC's for safety.  Ambulation/Gait Ambulation/Gait assistance: Supervision;Min guard Gait Distance (Feet): 22 Feet Assistive device: Rolling walker (2 wheeled) Gait Pattern/deviations: Step-to pattern;Decreased stance time - left Gait velocity: decr   General Gait Details: cues for sequence, RW position, safety with turns,  assist to rise and  transition to RW assisted to and from bathroom only   Stairs             Wheelchair Mobility    Modified Rankin (Stroke Patients Only)       Balance Overall balance assessment: Needs assistance Sitting-balance support: No upper extremity supported Sitting balance-Leahy Scale: Good     Standing balance support: Bilateral upper extremity supported Standing balance-Leahy Scale: Poor Standing balance comment: reliant on UEs                            Cognition Arousal/Alertness: Awake/alert Behavior During Therapy: WFL for tasks assessed/performed Overall Cognitive Status: Within Functional Limits for tasks assessed                                 General Comments: AxO x 3 slightly distracted requiring repeat instructions "foggy"      Exercises      General Comments        Pertinent Vitals/Pain Pain Assessment: 0-10 Pain Score: 8  Faces Pain Scale: Hurts whole lot Pain Location: RT knee when flexed Pain Descriptors / Indicators: Grimacing;Discomfort;Operative site guarding;Spasm Pain Intervention(s): Monitored during session;Premedicated before session;Repositioned;Ice applied    Home Living                      Prior Function            PT Goals (current goals can now be found in the care plan section) Acute Rehab PT  Goals Patient Stated Goal: Home tomorrow. Progress towards PT goals: Progressing toward goals    Frequency    7X/week      PT Plan Current plan remains appropriate    Co-evaluation              AM-PAC PT "6 Clicks" Mobility   Outcome Measure  Help needed turning from your back to your side while in a flat bed without using bedrails?: A Little Help needed moving from lying on your back to sitting on the side of a flat bed without using bedrails?: A Little Help needed moving to and from a bed to a chair (including a wheelchair)?: A Little Help needed standing up from a chair using your  arms (e.g., wheelchair or bedside chair)?: A Little Help needed to walk in hospital room?: A Little Help needed climbing 3-5 steps with a railing? : A Lot 6 Click Score: 17    End of Session Equipment Utilized During Treatment: Gait belt Activity Tolerance: Patient tolerated treatment well Patient left: in chair;with call bell/phone within reach;with chair alarm set Nurse Communication: Mobility status PT Visit Diagnosis: Other abnormalities of gait and mobility (R26.89);Difficulty in walking, not elsewhere classified (R26.2)     Time: 1130-1155 PT Time Calculation (min) (ACUTE ONLY): 25 min  Charges:  $Gait Training: 8-22 mins $Therapeutic Activity: 8-22 mins                     Rica Koyanagi  PTA Acute  Rehabilitation Services Pager      [phone] Office      [phone]

## 2021-03-22 NOTE — Progress Notes (Signed)
Occupational Therapy Treatment Patient Details Name: Colleen Glenn MRN: [id number] DOB: Apr 20, 1941 Today's Date: 03/22/2021    History of present illness Pt is a 80 y/o female s/p RT TKA.  PMH includes NSTEMI with Taktosubo cardiomyopathy,  cardiac cath, BPPV and HTN, back surgery, reverse TSA   OT comments  Treatment focused on dressing and toileting. Patient having a lot of pain when knee flexes requiring therapist to stabilize RLE with transfers. Patient mod assist for lower body dressing and min guard for toileting. Needs verbal cues for technique and safety. Patient mildly impulsive and forgetful. Needs 24/7 supervision at discharge.   Follow Up Recommendations  Home health OT    Equipment Recommendations       Recommendations for Other Services      Precautions / Restrictions Precautions Precautions: Fall Required Braces or Orthoses: Knee Immobilizer - Right Other Brace: Cold pack knee brace with KI Restrictions Weight Bearing Restrictions: No RLE Weight Bearing: Weight bearing as tolerated       Mobility Bed Mobility Overal bed mobility: Needs Assistance Bed Mobility: Supine to Sit     Supine to sit: Mod assist     General bed mobility comments: Min assist for RLE and assist with pivoting of hips to transfer to side of bed    Transfers Overall transfer level: Needs assistance Equipment used: Rolling walker (2 wheeled) Transfers: Sit to/from Stand Sit to Stand: Min guard         General transfer comment: Min guard to stand and ambulate to bathroom and then to recliner with RW.    Balance Overall balance assessment: Needs assistance Sitting-balance support: No upper extremity supported Sitting balance-Leahy Scale: Good     Standing balance support: Bilateral upper extremity supported Standing balance-Leahy Scale: Poor Standing balance comment: reliant on UEs                           ADL either performed or assessed with clinical  judgement   ADL                   Upper Body Dressing : Set up Upper Body Dressing Details (indicate cue type and reason): able to don night gown with set up Lower Body Dressing: Moderate assistance;Sitting/lateral leans;Sit to/from stand Lower Body Dressing Details (indicate cue type and reason): Mod assist to don underwear needing assistance to get over feet - but able to pull up from knees. Toilet Transfer: Financial risk analyst Details (indicate cue type and reason): Min guard with verbal cues for toilet transfer. Patient abel to perform the transfer but with knee flexed has a lot of pain and therapist had to support leg into more extended position. Propped on trash can during toileting task. Toileting- Clothing Manipulation and Hygiene: Sitting/lateral lean;Minimal assistance Toileting - Clothing Manipulation Details (indicate cue type and reason): able to manage most of her clothing and wipe in seated position. Min assist for some clothing by therapist.             Vision Patient Visual Report: No change from baseline     Perception     Praxis      Cognition Arousal/Alertness: Awake/alert Behavior During Therapy: WFL for tasks assessed/performed Overall Cognitive Status: Within Functional Limits for tasks assessed  General Comments: Functional cognition. Somewhat impulsive and forgetful.        Exercises     Shoulder Instructions       General Comments      Pertinent Vitals/ Pain       Pain Assessment: Faces Faces Pain Scale: Hurts whole lot Pain Location: RT knee when flexed Pain Descriptors / Indicators: Grimacing;Discomfort;Operative site guarding;Spasm Pain Intervention(s): Limited activity within patient's tolerance;Monitored during session;Repositioned  Home Living                                          Prior Functioning/Environment               Frequency  Min 3X/week        Progress Toward Goals  OT Goals(current goals can now be found in the care plan section)  Progress towards OT goals: Progressing toward goals  Acute Rehab OT Goals Patient Stated Goal: Home tomorrow. OT Goal Formulation: With patient/family Time For Goal Achievement: 04/03/21 Potential to Achieve Goals: Good  Plan Discharge plan remains appropriate    Co-evaluation                 AM-PAC OT "6 Clicks" Daily Activity     Outcome Measure   Help from another person eating meals?: None Help from another person taking care of personal grooming?: A Little Help from another person toileting, which includes using toliet, bedpan, or urinal?: A Lot Help from another person bathing (including washing, rinsing, drying)?: A Lot Help from another person to put on and taking off regular upper body clothing?: A Little Help from another person to put on and taking off regular lower body clothing?: A Lot 6 Click Score: 16    End of Session Equipment Utilized During Treatment: Gait belt;Rolling walker  OT Visit Diagnosis: Unsteadiness on feet (R26.81);Pain;Repeated falls (R29.6);History of falling (Z91.81) Pain - Right/Left: Right Pain - part of body: Knee   Activity Tolerance Patient limited by pain   Patient Left in chair;with chair alarm set   Nurse Communication Mobility status        Time: 1610-9604 OT Time Calculation (min): 23 min  Charges: OT General Charges $OT Visit: 1 Visit OT Treatments $Self Care/Home Management : 23-37 mins  Rayfield Beem, OTR/L [place]  Office [phone] Pager: [place] 03/22/2021, 10:31 AM

## 2021-03-22 NOTE — Plan of Care (Signed)
Plan of care reviewed and discussed with the patient. 

## 2021-03-22 NOTE — Clinical Social Work Note (Signed)
Patient and daughter now requesting rolling walker as patient's rolling walker is a used one given to her by family. RW referral made to [facility] with [employer]. Order in. [employer] to deliver RW to room.

## 2021-03-22 NOTE — Progress Notes (Signed)
    Subjective: Patient reports pain as mild to moderate. Feels good. Tolerating diet.  Urinating.   No CP, SOB.  Has worked with PT/OT on mobilization OOB.   When I saw her yesterday morning, I felt she was ready for d/c as her PT evals listed no issues. I put all her discharge paperwork in. Apparently PT felt she needed to stay another night so that is why she is still here. Eager to go home today.  Objective:   VITALS:   Vitals:   03/21/21 0839 03/21/21 0840 03/21/21 2125 03/22/21 0611  BP: 122/76 122/76 130/62 (!) 143/62  Pulse: 79 79 80 81  Resp:   16 16  Temp:   98 F (36.7 C) 98.4 F (36.9 C)  TempSrc:   Oral Oral  SpO2:   93% 95%  Weight:      Height:       CBC Latest Ref Rng & Units 03/08/2021 10/02/2020 10/01/2020  WBC 4.0 - 10.5 K/uL 5.0 6.6 8.7  Hemoglobin 12.0 - 15.0 g/dL 12.1 12.5 13.2  Hematocrit 36.0 - 46.0 % 39.3 37.7 39.3  Platelets 150 - 400 K/uL 209 221 226   BMP Latest Ref Rng & Units 03/08/2021 10/25/2020 10/03/2020  Glucose 70 - 99 mg/dL 100(H) 92 88  BUN 8 - 23 mg/dL 24(H) 12 21  Creatinine 0.44 - 1.00 mg/dL 0.72 0.80 1.01(H)  BUN/Creat Ratio 12 - 28 - 15 -  Sodium 135 - 145 mmol/L 143 143 137  Potassium 3.5 - 5.1 mmol/L 4.3 4.4 3.7  Chloride 98 - 111 mmol/L 108 105 100  CO2 22 - 32 mmol/L 31 22 28   Calcium 8.9 - 10.3 mg/dL 9.0 9.0 8.8(L)   Intake/Output      06/01 0701 06/02 0700 06/02 0701 06/03 0700   P.O.     I.V. (mL/kg) 111.2 (1.6)    IV Piggyback     Total Intake(mL/kg) 111.2 (1.6)    Urine (mL/kg/hr) 1100 (0.7)    Blood     Total Output 1100    Net -988.8            Physical Exam: General: NAD. Laying in bed, comfortable Resp: No increased wob Cardio: regular rate and rhythm ABD soft Neurologically intact MSK Neurovascularly intact Sensation intact distally Intact pulses distally Dorsiflexion/Plantar flexion intact Incision: dressing C/D/I   Assessment: 2 Days Post-Op  S/P Procedure(s) (LRB): TOTAL KNEE ARTHROPLASTY  (Right) by Dr. Ernesta Amble. Percell Miller on 03/20/21  Active Problems:   S/P total knee arthroplasty, right   Plan: Advance diet Up with therapy Incentive Spirometry Elevate and Apply ice  Weightbearing: WBAT RLE Insicional and dressing care: Dressings left intact until follow-up and Reinforce dressings as needed Orthopedic device(s): None Showering: Keep dressing dry VTE prophylaxis: Aspirin 81mg  BID x 30 days, SCDs, ambulation Pain control: Continue current regimen. Already sent post op meds to her pharmacy Follow - up plan: 2 weeks Contact information:  Edmonia Lynch MD, Aggie Moats PA-C  Dispo: Home today for sure    Alisa Graff Office [phone] 03/22/2021, 7:37 AM

## 2021-03-23 DIAGNOSIS — Z87891 Personal history of nicotine dependence: Secondary | ICD-10-CM | POA: Diagnosis not present

## 2021-03-23 DIAGNOSIS — M479 Spondylosis, unspecified: Secondary | ICD-10-CM | POA: Diagnosis not present

## 2021-03-23 DIAGNOSIS — H8110 Benign paroxysmal vertigo, unspecified ear: Secondary | ICD-10-CM | POA: Diagnosis not present

## 2021-03-23 DIAGNOSIS — E785 Hyperlipidemia, unspecified: Secondary | ICD-10-CM | POA: Diagnosis not present

## 2021-03-23 DIAGNOSIS — Z8673 Personal history of transient ischemic attack (TIA), and cerebral infarction without residual deficits: Secondary | ICD-10-CM | POA: Diagnosis not present

## 2021-03-23 DIAGNOSIS — Z96651 Presence of right artificial knee joint: Secondary | ICD-10-CM | POA: Diagnosis not present

## 2021-03-23 DIAGNOSIS — M722 Plantar fascial fibromatosis: Secondary | ICD-10-CM | POA: Diagnosis not present

## 2021-03-23 DIAGNOSIS — F410 Panic disorder [episodic paroxysmal anxiety] without agoraphobia: Secondary | ICD-10-CM | POA: Diagnosis not present

## 2021-03-23 DIAGNOSIS — M19041 Primary osteoarthritis, right hand: Secondary | ICD-10-CM | POA: Diagnosis not present

## 2021-03-23 DIAGNOSIS — K219 Gastro-esophageal reflux disease without esophagitis: Secondary | ICD-10-CM | POA: Diagnosis not present

## 2021-03-23 DIAGNOSIS — I252 Old myocardial infarction: Secondary | ICD-10-CM | POA: Diagnosis not present

## 2021-03-23 DIAGNOSIS — M19042 Primary osteoarthritis, left hand: Secondary | ICD-10-CM | POA: Diagnosis not present

## 2021-03-23 DIAGNOSIS — F411 Generalized anxiety disorder: Secondary | ICD-10-CM | POA: Diagnosis not present

## 2021-03-23 DIAGNOSIS — Z471 Aftercare following joint replacement surgery: Secondary | ICD-10-CM | POA: Diagnosis not present

## 2021-03-23 DIAGNOSIS — M810 Age-related osteoporosis without current pathological fracture: Secondary | ICD-10-CM | POA: Diagnosis not present

## 2021-03-23 DIAGNOSIS — Z96641 Presence of right artificial hip joint: Secondary | ICD-10-CM | POA: Diagnosis not present

## 2021-03-23 DIAGNOSIS — Z96611 Presence of right artificial shoulder joint: Secondary | ICD-10-CM | POA: Diagnosis not present

## 2021-03-23 DIAGNOSIS — N3281 Overactive bladder: Secondary | ICD-10-CM | POA: Diagnosis not present

## 2021-03-23 DIAGNOSIS — Z96612 Presence of left artificial shoulder joint: Secondary | ICD-10-CM | POA: Diagnosis not present

## 2021-03-23 DIAGNOSIS — I10 Essential (primary) hypertension: Secondary | ICD-10-CM | POA: Diagnosis not present

## 2021-03-23 DIAGNOSIS — Z90710 Acquired absence of both cervix and uterus: Secondary | ICD-10-CM | POA: Diagnosis not present

## 2021-03-23 DIAGNOSIS — I5181 Takotsubo syndrome: Secondary | ICD-10-CM | POA: Diagnosis not present

## 2021-03-23 DIAGNOSIS — F32A Depression, unspecified: Secondary | ICD-10-CM | POA: Diagnosis not present

## 2021-03-23 DIAGNOSIS — Z86010 Personal history of colonic polyps: Secondary | ICD-10-CM | POA: Diagnosis not present

## 2021-03-23 DIAGNOSIS — K5790 Diverticulosis of intestine, part unspecified, without perforation or abscess without bleeding: Secondary | ICD-10-CM | POA: Diagnosis not present

## 2021-03-23 NOTE — Progress Notes (Signed)
LATE ENTRY for Tx 03/22/2021  Physical Therapy Treatment Patient Details Name: Colleen Glenn MRN: [id number] DOB: [birth year] Today's Date: 03/23/2021    History of Present Illness Pt is a 80 y/o female s/p RT TKA.  PMH includes NSTEMI with Taktosubo cardiomyopathy,  cardiac cath, BPPV and HTN, back surgery, reverse TSA    PT Comments    POD # 2 pm session with daughter General transfer comment: Instructed daughter "hands on" safe handling General Gait Details: instructed daughter "hands on" safety with mobility. General stair comments: attempted backward with daughter present "hands on" however pt unable to tolerate increased WB thru R knee to step up with her left.  Daughter stated she can use the wheelchair to get pt in home. Then returned to room to perform some TE's following HEP handout.  Instructed on proper tech, freq as well as use of ICE.   Addressed all mobility questions, discussed appropriate activity, educated on use of ICE.  Pt ready for D/C to home.    Follow Up Recommendations  Follow surgeon's recommendation for DC plan and follow-up therapies;Supervision for mobility/OOB     Equipment Recommendations  Rolling walker with 5" wheels    Recommendations for Other Services       Precautions / Restrictions Precautions Precautions: Fall Precaution Comments: no pillow under knee Required Braces or Orthoses: Knee Immobilizer - Right Knee Immobilizer - Right: On when out of bed or walking Other Brace: instructed daughter to apply KI for increased support for stairs Restrictions Weight Bearing Restrictions: No RLE Weight Bearing: Weight bearing as tolerated    Mobility  Bed Mobility               General bed mobility comments: OOB in recliner    Transfers Overall transfer level: Needs assistance Equipment used: Rolling walker (2 wheeled) Transfers: Sit to/from Stand Sit to Stand: Min guard Stand pivot transfers: Min guard;Min assist        General transfer comment: Instructed daughter "hands on" safe handling  Ambulation/Gait Ambulation/Gait assistance: Supervision;Min guard Gait Distance (Feet): 18 Feet Assistive device: Rolling walker (2 wheeled) Gait Pattern/deviations: Step-to pattern;Decreased stance time - left Gait velocity: decr   General Gait Details: instructed daughter "hands on" safety with mobility.   Stairs Stairs: Yes       General stair comments: attempted backward with daughter present "hands on" however pt unable to tolerate increased WB thru R knee to step up with her left.  Daughter stated she can use the wheelchair to get pt in home.   Wheelchair Mobility    Modified Rankin (Stroke Patients Only)       Balance                                            Cognition Arousal/Alertness: Awake/alert   Overall Cognitive Status: Within Functional Limits for tasks assessed                                 General Comments: AxO x 3 following all directions slightly distracted by pain      Exercises   Total Knee Replacement TE's following HEP handout 10 reps B LE ankle pumps 05 reps towel squeezes 05 reps knee presses 05 reps heel slides  05 reps SAQ's 05 reps SLR's 05 reps ABD Educated on use  of gait belt to assist with TE's Followed by ICE    General Comments        Pertinent Vitals/Pain Pain Assessment: Faces Faces Pain Scale: Hurts even more Pain Location: RT knee with activity Pain Descriptors / Indicators: Grimacing;Discomfort;Operative site guarding;Spasm Pain Intervention(s): Monitored during session;Premedicated before session;Repositioned;Ice applied    Home Living                      Prior Function            PT Goals (current goals can now be found in the care plan section) Progress towards PT goals: Progressing toward goals    Frequency    7X/week      PT Plan Current plan remains appropriate     Co-evaluation              AM-PAC PT "6 Clicks" Mobility   Outcome Measure  Help needed turning from your back to your side while in a flat bed without using bedrails?: A Little Help needed moving from lying on your back to sitting on the side of a flat bed without using bedrails?: A Little Help needed moving to and from a bed to a chair (including a wheelchair)?: A Little Help needed standing up from a chair using your arms (e.g., wheelchair or bedside chair)?: A Little Help needed to walk in hospital room?: A Little Help needed climbing 3-5 steps with a railing? : Total 6 Click Score: 16    End of Session Equipment Utilized During Treatment: Gait belt Activity Tolerance: Patient tolerated treatment well Patient left: in chair;with call bell/phone within reach;with chair alarm set Nurse Communication: Mobility status (pt ready for D/C to home with daughter) PT Visit Diagnosis: Other abnormalities of gait and mobility (R26.89);Difficulty in walking, not elsewhere classified (R26.2)     Time:  - 13:35 - 14:00    Charges:   1 gt    1 ta                     Rica Koyanagi  PTA Acute  Rehabilitation Services Pager      [phone] Office      [phone]

## 2021-03-26 DIAGNOSIS — M19041 Primary osteoarthritis, right hand: Secondary | ICD-10-CM | POA: Diagnosis not present

## 2021-03-26 DIAGNOSIS — M722 Plantar fascial fibromatosis: Secondary | ICD-10-CM | POA: Diagnosis not present

## 2021-03-26 DIAGNOSIS — M19042 Primary osteoarthritis, left hand: Secondary | ICD-10-CM | POA: Diagnosis not present

## 2021-03-26 DIAGNOSIS — M810 Age-related osteoporosis without current pathological fracture: Secondary | ICD-10-CM | POA: Diagnosis not present

## 2021-03-26 DIAGNOSIS — Z471 Aftercare following joint replacement surgery: Secondary | ICD-10-CM | POA: Diagnosis not present

## 2021-03-26 DIAGNOSIS — M479 Spondylosis, unspecified: Secondary | ICD-10-CM | POA: Diagnosis not present

## 2021-03-26 NOTE — Discharge Summary (Signed)
Physician Discharge Summary  Patient ID: Colleen Glenn MRN: [id number] DOB/AGE: 1940/12/30 80 y.o.  Admit date: 03/20/2021 Discharge date: 03/22/2021  Admission Diagnoses: right knee osteoarthritis  Discharge Diagnoses:  Active Problems:   S/P total knee arthroplasty, right   Discharged Condition: fair  Hospital Course: Patient underwent a right TKA by Dr. Percell Miller on 04/09/34 with no complications. She spent 2 nights in observation due to difficulty with mobilization post-op. She improved and is ready to go home.  Consults: None  Significant Diagnostic Studies: none  Treatments: IV hydration, antibiotics: Ancef, analgesia: acetaminophen, Vicodin, Dilaudid, Morphine and Oxycodone, anticoagulation: ASA and surgery: right TKA  Discharge Exam: Blood pressure 122/70, pulse 84, temperature 98.4 F (36.9 C), temperature source Oral, resp. rate 18, height 5\' 5"  (1.651 m), weight 70.5 kg, SpO2 94 %. General appearance: alert, cooperative and no distress Head: Normocephalic, without obvious abnormality, atraumatic Eyes: conjunctivae/corneas clear. PERRL, EOM's intact. Fundi benign. Resp: clear to auscultation bilaterally Cardio: regular rate and rhythm, S1, S2 normal, no murmur, click, rub or gallop GI: soft, non-tender; bowel sounds normal; no masses,  no organomegaly Extremities: extremities normal, atraumatic, no cyanosis or edema Pulses: 2+ and symmetric Neurologic: Alert and oriented X 3, normal strength and tone. Normal symmetric reflexes. Normal coordination and gait Incision/Wound: c/d/i, covered with Ace wrap  Disposition: Discharge disposition: 01-Home or Self Care       Discharge Instructions    Call MD / Call 911   Complete by: As directed    If you experience chest pain or shortness of breath, CALL 911 and be transported to the hospital emergency room.  If you develope a fever above 101 F, pus (white drainage) or increased drainage or redness at the wound, or calf  pain, call your surgeon's office.   Call MD / Call 911   Complete by: As directed    If you experience chest pain or shortness of breath, CALL 911 and be transported to the hospital emergency room.  If you develope a fever above 101 F, pus (white drainage) or increased drainage or redness at the wound, or calf pain, call your surgeon's office.   Diet - low sodium heart healthy   Complete by: As directed    Diet - low sodium heart healthy   Complete by: As directed    Discharge instructions   Complete by: As directed    You may bear weight as tolerated. Keep your dressing on and dry until follow up. Take medicine to prevent blood clots as directed. Take pain medicine as needed with the goal of transitioning to over the counter medicines.    INSTRUCTIONS AFTER JOINT REPLACEMENT   Remove items at home which could result in a fall. This includes throw rugs or furniture in walking pathways ICE to the affected joint every three hours while awake for 30 minutes at a time, for at least the first 3-5 days, and then as needed for pain and swelling.  Continue to use ice for pain and swelling. You may notice swelling that will progress down to the foot and ankle.  This is normal after surgery.  Elevate your leg when you are not up walking on it.   Continue to use the breathing machine you got in the hospital (incentive spirometer) which will help keep your temperature down.  It is common for your temperature to cycle up and down following surgery, especially at night when you are not up moving around and exerting yourself.  The breathing machine  keeps your lungs expanded and your temperature down.   DIET:  As you were doing prior to hospitalization, we recommend a well-balanced diet.  DRESSING / WOUND CARE / SHOWERING  You may shower 3 days after surgery, but keep the wounds dry during showering.  You may use an occlusive plastic wrap (Press'n Seal for example) with blue painter's tape at edges, NO  SOAKING/SUBMERGING IN THE BATHTUB.  If the bandage gets wet, call the office.  ACTIVITY  Increase activity slowly as tolerated, but follow the weight bearing instructions below.   No driving for 6 weeks or until further direction given by your physician.  You cannot drive while taking narcotics.  No lifting or carrying greater than 10 lbs. until further directed by your surgeon. Avoid periods of inactivity such as sitting longer than an hour when not asleep. This helps prevent blood clots.  You may return to work once you are authorized by your doctor.    WEIGHT BEARING   Weight bearing as tolerated with assist device (walker, cane, etc) as directed, use it as long as suggested by your surgeon or therapist, typically at least 4-6 weeks.   EXERCISES  Results after joint replacement surgery are often greatly improved when you follow the exercise, range of motion and muscle strengthening exercises prescribed by your doctor. Safety measures are also important to protect the joint from further injury. Any time any of these exercises cause you to have increased pain or swelling, decrease what you are doing until you are comfortable again and then slowly increase them. If you have problems or questions, call your caregiver or physical therapist for advice.   Rehabilitation is important following a joint replacement. After just a few days of immobilization, the muscles of the leg can become weakened and shrink (atrophy).  These exercises are designed to build up the tone and strength of the thigh and leg muscles and to improve motion. Often times heat used for twenty to thirty minutes before working out will loosen up your tissues and help with improving the range of motion but do not use heat for the first two weeks following surgery (sometimes heat can increase post-operative swelling).   These exercises can be done on a training (exercise) mat, on the floor, on a table or on a bed. Use whatever  works the best and is most comfortable for you.    Use music or television while you are exercising so that the exercises are a pleasant break in your day. This will make your life better with the exercises acting as a break in your routine that you can look forward to.   Perform all exercises about fifteen times, three times per day or as directed.  You should exercise both the operative leg and the other leg as well.  Exercises include:   Quad Sets - Tighten up the muscle on the front of the thigh (Quad) and hold for 5-10 seconds.   Straight Leg Raises - With your knee straight (if you were given a brace, keep it on), lift the leg to 60 degrees, hold for 3 seconds, and slowly lower the leg.  Perform this exercise against resistance later as your leg gets stronger.  Leg Slides: Lying on your back, slowly slide your foot toward your buttocks, bending your knee up off the floor (only go as far as is comfortable). Then slowly slide your foot back down until your leg is flat on the floor again.  Angel Wings: Lying on  your back spread your legs to the side as far apart as you can without causing discomfort.  Hamstring Strength:  Lying on your back, push your heel against the floor with your leg straight by tightening up the muscles of your buttocks.  Repeat, but this time bend your knee to a comfortable angle, and push your heel against the floor.  You may put a pillow under the heel to make it more comfortable if necessary.   A rehabilitation program following joint replacement surgery can speed recovery and prevent re-injury in the future due to weakened muscles. Contact your doctor or a physical therapist for more information on knee rehabilitation.    CONSTIPATION  Constipation is defined medically as fewer than three stools per week and severe constipation as less than one stool per week.  Even if you have a regular bowel pattern at home, your normal regimen is likely to be disrupted due to multiple  reasons following surgery.  Combination of anesthesia, postoperative narcotics, change in appetite and fluid intake all can affect your bowels.   YOU MUST use at least one of the following options; they are listed in order of increasing strength to get the job done.  They are all available over the counter, and you may need to use some, POSSIBLY even all of these options:    Drink plenty of fluids (prune juice may be helpful) and high fiber foods Colace 100 mg by mouth twice a day  Senokot for constipation as directed and as needed Dulcolax (bisacodyl), take with full glass of water  Miralax (polyethylene glycol) once or twice a day as needed.  If you have tried all these things and are unable to have a bowel movement in the first 3-4 days after surgery call either your surgeon or your primary doctor.    If you experience loose stools or diarrhea, hold the medications until you stool forms back up.  If your symptoms do not get better within 1 week or if they get worse, check with your doctor.  If you experience "the worst abdominal pain ever" or develop nausea or vomiting, please contact the office immediately for further recommendations for treatment.   ITCHING:  If you experience itching with your medications, try taking only a single pain pill, or even half a pain pill at a time.  You can also use Benadryl over the counter for itching or also to help with sleep.   TED HOSE STOCKINGS:  Use stockings on both legs until for at least 2 weeks or as directed by physician office. They may be removed at night for sleeping.  MEDICATIONS:  See your medication summary on the "After Visit Summary" that nursing will review with you.  You may have some home medications which will be placed on hold until you complete the course of blood thinner medication.  It is important for you to complete the blood thinner medication as prescribed.  Take medicines as prescribed.   You have several different medicines  that work in different ways. - Tylenol is for mild to moderate pain. Try to take this medicine before turning to your narcotic medicines.  - Gabapentin is to reduce pain / inflammation - Robaxin is for muscle spasms - Oxycodone is a narcotic pain medicine.  Take this for severe pain. This medicine can be dehydrating / constipating. - Zofran is for nausea and vomiting. - Aspirin is to prevent blood clots after surgery.   PRECAUTIONS:  If you experience chest pain  or shortness of breath - call 911 immediately for transfer to the hospital emergency department.   If you develop a fever greater that 101 F, purulent drainage from wound, increased redness or drainage from wound, foul odor from the wound/dressing, or calf pain - CONTACT YOUR SURGEON.                                                   FOLLOW-UP APPOINTMENTS:  If you do not already have a post-op appointment, please call the office [phone] for an appointment to be seen by Dr. Percell Miller in 2 weeks.   OTHER INSTRUCTIONS:   MAKE SURE YOU:  Understand these instructions.  Get help right away if you are not doing well or get worse.    Thank you for letting us be a part of your medical care team.  It is a privilege we respect greatly.  We hope these instructions will help you stay on track for a fast and full recovery!   Discharge instructions   Complete by: As directed    You may bear weight as tolerated. Keep your dressing on and dry until follow up. Take medicine to prevent blood clots as directed. Take pain medicine as needed with the goal of transitioning to over the counter medicines.    INSTRUCTIONS AFTER JOINT REPLACEMENT   Remove items at home which could result in a fall. This includes throw rugs or furniture in walking pathways ICE to the affected joint every three hours while awake for 30 minutes at a time, for at least the first 3-5 days, and then as needed for pain and swelling.  Continue to use ice for pain and  swelling. You may notice swelling that will progress down to the foot and ankle.  This is normal after surgery.  Elevate your leg when you are not up walking on it.   Continue to use the breathing machine you got in the hospital (incentive spirometer) which will help keep your temperature down.  It is common for your temperature to cycle up and down following surgery, especially at night when you are not up moving around and exerting yourself.  The breathing machine keeps your lungs expanded and your temperature down.   DIET:  As you were doing prior to hospitalization, we recommend a well-balanced diet.  DRESSING / WOUND CARE / SHOWERING  You may shower 3 days after surgery, but keep the wounds dry during showering.  You may use an occlusive plastic wrap (Press'n Seal for example) with blue painter's tape at edges, NO SOAKING/SUBMERGING IN THE BATHTUB.  If the bandage gets wet, call the office.  ACTIVITY  Increase activity slowly as tolerated, but follow the weight bearing instructions below.   No driving for 6 weeks or until further direction given by your physician.  You cannot drive while taking narcotics.  No lifting or carrying greater than 10 lbs. until further directed by your surgeon. Avoid periods of inactivity such as sitting longer than an hour when not asleep. This helps prevent blood clots.  You may return to work once you are authorized by your doctor.    WEIGHT BEARING   Weight bearing as tolerated with assist device (walker, cane, etc) as directed, use it as long as suggested by your surgeon or therapist, typically at least 4-6 weeks.   EXERCISES  Results after joint replacement surgery are often greatly improved when you follow the exercise, range of motion and muscle strengthening exercises prescribed by your doctor. Safety measures are also important to protect the joint from further injury. Any time any of these exercises cause you to have increased pain or swelling,  decrease what you are doing until you are comfortable again and then slowly increase them. If you have problems or questions, call your caregiver or physical therapist for advice.   Rehabilitation is important following a joint replacement. After just a few days of immobilization, the muscles of the leg can become weakened and shrink (atrophy).  These exercises are designed to build up the tone and strength of the thigh and leg muscles and to improve motion. Often times heat used for twenty to thirty minutes before working out will loosen up your tissues and help with improving the range of motion but do not use heat for the first two weeks following surgery (sometimes heat can increase post-operative swelling).   These exercises can be done on a training (exercise) mat, on the floor, on a table or on a bed. Use whatever works the best and is most comfortable for you.    Use music or television while you are exercising so that the exercises are a pleasant break in your day. This will make your life better with the exercises acting as a break in your routine that you can look forward to.   Perform all exercises about fifteen times, three times per day or as directed.  You should exercise both the operative leg and the other leg as well.  Exercises include:   Quad Sets - Tighten up the muscle on the front of the thigh (Quad) and hold for 5-10 seconds.   Straight Leg Raises - With your knee straight (if you were given a brace, keep it on), lift the leg to 60 degrees, hold for 3 seconds, and slowly lower the leg.  Perform this exercise against resistance later as your leg gets stronger.  Leg Slides: Lying on your back, slowly slide your foot toward your buttocks, bending your knee up off the floor (only go as far as is comfortable). Then slowly slide your foot back down until your leg is flat on the floor again.  Angel Wings: Lying on your back spread your legs to the side as far apart as you can without  causing discomfort.  Hamstring Strength:  Lying on your back, push your heel against the floor with your leg straight by tightening up the muscles of your buttocks.  Repeat, but this time bend your knee to a comfortable angle, and push your heel against the floor.  You may put a pillow under the heel to make it more comfortable if necessary.   A rehabilitation program following joint replacement surgery can speed recovery and prevent re-injury in the future due to weakened muscles. Contact your doctor or a physical therapist for more information on knee rehabilitation.    CONSTIPATION  Constipation is defined medically as fewer than three stools per week and severe constipation as less than one stool per week.  Even if you have a regular bowel pattern at home, your normal regimen is likely to be disrupted due to multiple reasons following surgery.  Combination of anesthesia, postoperative narcotics, change in appetite and fluid intake all can affect your bowels.   YOU MUST use at least one of the following options; they are listed in order of increasing strength  to get the job done.  They are all available over the counter, and you may need to use some, POSSIBLY even all of these options:    Drink plenty of fluids (prune juice may be helpful) and high fiber foods Colace 100 mg by mouth twice a day  Senokot for constipation as directed and as needed Dulcolax (bisacodyl), take with full glass of water  Miralax (polyethylene glycol) once or twice a day as needed.  If you have tried all these things and are unable to have a bowel movement in the first 3-4 days after surgery call either your surgeon or your primary doctor.    If you experience loose stools or diarrhea, hold the medications until you stool forms back up.  If your symptoms do not get better within 1 week or if they get worse, check with your doctor.  If you experience "the worst abdominal pain ever" or develop nausea or vomiting, please  contact the office immediately for further recommendations for treatment.   ITCHING:  If you experience itching with your medications, try taking only a single pain pill, or even half a pain pill at a time.  You can also use Benadryl over the counter for itching or also to help with sleep.   TED HOSE STOCKINGS:  Use stockings on both legs until for at least 2 weeks or as directed by physician office. They may be removed at night for sleeping.  MEDICATIONS:  See your medication summary on the "After Visit Summary" that nursing will review with you.  You may have some home medications which will be placed on hold until you complete the course of blood thinner medication.  It is important for you to complete the blood thinner medication as prescribed.  Take medicines as prescribed.   You have several different medicines that work in different ways. Tylenol is for mild to moderate pain. Try to take this medicine before turning to your narcotic medicines.  Meloxicam is to reduce pain / inflammation Robaxin is for muscle spasms Oxycodone is a narcotic pain medicine.  Take this for severe pain. This medicine can be dehydrating / constipating. Zofran is for nausea and vomiting. Aspirin is to prevent blood clots after surgery.   PRECAUTIONS:  If you experience chest pain or shortness of breath - call 911 immediately for transfer to the hospital emergency department.   If you develop a fever greater that 101 F, purulent drainage from wound, increased redness or drainage from wound, foul odor from the wound/dressing, or calf pain - CONTACT YOUR SURGEON.                                                   FOLLOW-UP APPOINTMENTS:  If you do not already have a post-op appointment, please call the office [phone] for an appointment to be seen by Dr. Percell Miller in 2 weeks.   OTHER INSTRUCTIONS:   MAKE SURE YOU:  Understand these instructions.  Get help right away if you are not doing well or get worse.     Thank you for letting us be a part of your medical care team.  It is a privilege we respect greatly.  We hope these instructions will help you stay on track for a fast and full recovery!   Do not put a pillow under the knee. Place it  under the heel.   Complete by: As directed    Do not put a pillow under the knee. Place it under the heel.   Complete by: As directed    Post-operative opioid taper instructions:   Complete by: As directed    POST-OPERATIVE OPIOID TAPER INSTRUCTIONS: It is important to wean off of your opioid medication as soon as possible. If you do not need pain medication after your surgery it is ok to stop day one. Opioids include: Codeine, Hydrocodone(Norco, Vicodin), Oxycodone(Percocet, oxycontin) and hydromorphone amongst others.  Long term and even short term use of opiods can cause: Increased pain response Dependence Constipation Depression Respiratory depression And more.  Withdrawal symptoms can include Flu like symptoms Nausea, vomiting And more Techniques to manage these symptoms Hydrate well Eat regular healthy meals Stay active Use relaxation techniques(deep breathing, meditating, yoga) Do Not substitute Alcohol to help with tapering If you have been on opioids for less than two weeks and do not have pain than it is ok to stop all together.  Plan to wean off of opioids This plan should start within one week post op of your joint replacement. Maintain the same interval or time between taking each dose and first decrease the dose.  Cut the total daily intake of opioids by one tablet each day Next start to increase the time between doses. The last dose that should be eliminated is the evening dose.      Post-operative opioid taper instructions:   Complete by: As directed    POST-OPERATIVE OPIOID TAPER INSTRUCTIONS: It is important to wean off of your opioid medication as soon as possible. If you do not need pain medication after your surgery it  is ok to stop day one. Opioids include: Codeine, Hydrocodone(Norco, Vicodin), Oxycodone(Percocet, oxycontin) and hydromorphone amongst others.  Long term and even short term use of opiods can cause: Increased pain response Dependence Constipation Depression Respiratory depression And more.  Withdrawal symptoms can include Flu like symptoms Nausea, vomiting And more Techniques to manage these symptoms Hydrate well Eat regular healthy meals Stay active Use relaxation techniques(deep breathing, meditating, yoga) Do Not substitute Alcohol to help with tapering If you have been on opioids for less than two weeks and do not have pain than it is ok to stop all together.  Plan to wean off of opioids This plan should start within one week post op of your joint replacement. Maintain the same interval or time between taking each dose and first decrease the dose.  Cut the total daily intake of opioids by one tablet each day Next start to increase the time between doses. The last dose that should be eliminated is the evening dose.      Weight bearing as tolerated   Complete by: As directed    Laterality: right   Extremity: Lower   Weight bearing as tolerated   Complete by: As directed    Laterality: right   Extremity: Lower     Allergies as of 03/22/2021   No Known Allergies     Medication List    STOP taking these medications   cyclobenzaprine 5 MG tablet Commonly known as: FLEXERIL   naproxen sodium 220 MG tablet Commonly known as: ALEVE   ondansetron 8 MG disintegrating tablet Commonly known as: ZOFRAN-ODT   polyethylene glycol powder 17 GM/SCOOP powder Commonly known as: GLYCOLAX/MIRALAX   promethazine 12.5 MG tablet Commonly known as: PHENERGAN     TAKE these medications   acetaminophen 500 MG tablet  Commonly known as: TYLENOL Take 2 tablets (1,000 mg total) by mouth every 6 (six) hours as needed for mild pain or moderate pain. What changed:   medication  strength  how much to take  reasons to take this   aspirin EC 81 MG tablet Take 1 tablet (81 mg total) by mouth 2 (two) times daily. For DVT prophylaxis for 30 days after surgery. What changed:   when to take this  additional instructions   buPROPion 300 MG 24 hr tablet Commonly known as: WELLBUTRIN XL Take 300 mg by mouth daily before breakfast.   carvedilol 12.5 MG tablet Commonly known as: COREG Take 1 tablet (12.5 mg total) by mouth 2 (two) times daily with a meal.   donepezil 10 MG tablet Commonly known as: ARICEPT Take 10 mg by mouth at bedtime.   DULoxetine 60 MG capsule Commonly known as: CYMBALTA Take 60 mg by mouth daily.   ezetimibe 10 MG tablet Commonly known as: ZETIA Take 10 mg by mouth daily.   gabapentin 300 MG capsule Commonly known as: NEURONTIN TAKE 1 CAPSULE(300 MG) BY MOUTH AT BEDTIME What changed: See the new instructions.   methocarbamol 500 MG tablet Commonly known as: Robaxin Take 1 tablet (500 mg total) by mouth every 8 (eight) hours as needed for muscle spasms.   Myrbetriq 50 MG Tb24 tablet Generic drug: mirabegron ER Take 50 mg by mouth daily.   ondansetron 4 MG tablet Commonly known as: Zofran Take 1 tablet (4 mg total) by mouth daily as needed for nausea or vomiting.   oxyCODONE 5 MG immediate release tablet Commonly known as: Roxicodone Take 1 tablet (5 mg total) by mouth every 6 (six) hours as needed for severe pain. Do not take more than 6 tablets in a 24 hour period.   pantoprazole 40 MG tablet Commonly known as: PROTONIX TAKE 1 TABLET (40 MG TOTAL) BY MOUTH DAILY AT 6AM. What changed:   how much to take  how to take this  when to take this   raloxifene 60 MG tablet Commonly known as: EVISTA Take 60 mg by mouth daily.   simvastatin 40 MG tablet Commonly known as: ZOCOR Take 40 mg by mouth daily.   vitamin B-12 1000 MCG tablet Commonly known as: CYANOCOBALAMIN Take 1,000 mcg by mouth daily.   Vitamin D-3  125 MCG (5000 UT) Tabs Take 5,000 Units by mouth daily after breakfast.            Discharge Care Instructions  (From admission, onward)         Start     Ordered   03/22/21 0000  Weight bearing as tolerated       Question Answer Comment  Laterality right   Extremity Lower      03/22/21 1150   03/21/21 0000  Weight bearing as tolerated       Question Answer Comment  Laterality right   Extremity Lower      03/21/21 0923          Follow-up Information    Renette Butters, MD. Go on 04/04/2021.   Specialty: Orthopedic Surgery Why: your appointment is scheduled for 11:15.  Contact information: [address] [place] [address] [phone]        [facility] [place] Follow up.   Specialty: Home Health Services Why: HHPT will provide 5 visits at home prior to you starting outpatient physical therapy  Contact information: [address] [place]  [facility] Go on 04/04/2021.   Why: Your appointment has been scheduled for 12:00. Please go over to the therapy side as soon as your MD appointment is complete  Contact information: [facility] [place] [address] [phone]               Signed: Alisa Graff 03/26/2021, 10:03 PM

## 2021-03-28 DIAGNOSIS — M19041 Primary osteoarthritis, right hand: Secondary | ICD-10-CM | POA: Diagnosis not present

## 2021-03-28 DIAGNOSIS — M479 Spondylosis, unspecified: Secondary | ICD-10-CM | POA: Diagnosis not present

## 2021-03-28 DIAGNOSIS — M19042 Primary osteoarthritis, left hand: Secondary | ICD-10-CM | POA: Diagnosis not present

## 2021-03-28 DIAGNOSIS — M722 Plantar fascial fibromatosis: Secondary | ICD-10-CM | POA: Diagnosis not present

## 2021-03-28 DIAGNOSIS — Z471 Aftercare following joint replacement surgery: Secondary | ICD-10-CM | POA: Diagnosis not present

## 2021-03-28 DIAGNOSIS — M810 Age-related osteoporosis without current pathological fracture: Secondary | ICD-10-CM | POA: Diagnosis not present

## 2021-03-30 DIAGNOSIS — M19041 Primary osteoarthritis, right hand: Secondary | ICD-10-CM | POA: Diagnosis not present

## 2021-03-30 DIAGNOSIS — M810 Age-related osteoporosis without current pathological fracture: Secondary | ICD-10-CM | POA: Diagnosis not present

## 2021-03-30 DIAGNOSIS — Z471 Aftercare following joint replacement surgery: Secondary | ICD-10-CM | POA: Diagnosis not present

## 2021-03-30 DIAGNOSIS — M722 Plantar fascial fibromatosis: Secondary | ICD-10-CM | POA: Diagnosis not present

## 2021-03-30 DIAGNOSIS — M19042 Primary osteoarthritis, left hand: Secondary | ICD-10-CM | POA: Diagnosis not present

## 2021-03-30 DIAGNOSIS — M479 Spondylosis, unspecified: Secondary | ICD-10-CM | POA: Diagnosis not present

## 2021-04-03 DIAGNOSIS — M810 Age-related osteoporosis without current pathological fracture: Secondary | ICD-10-CM | POA: Diagnosis not present

## 2021-04-03 DIAGNOSIS — M19041 Primary osteoarthritis, right hand: Secondary | ICD-10-CM | POA: Diagnosis not present

## 2021-04-03 DIAGNOSIS — M722 Plantar fascial fibromatosis: Secondary | ICD-10-CM | POA: Diagnosis not present

## 2021-04-03 DIAGNOSIS — M479 Spondylosis, unspecified: Secondary | ICD-10-CM | POA: Diagnosis not present

## 2021-04-03 DIAGNOSIS — Z471 Aftercare following joint replacement surgery: Secondary | ICD-10-CM | POA: Diagnosis not present

## 2021-04-03 DIAGNOSIS — M19042 Primary osteoarthritis, left hand: Secondary | ICD-10-CM | POA: Diagnosis not present

## 2021-04-04 DIAGNOSIS — M1711 Unilateral primary osteoarthritis, right knee: Secondary | ICD-10-CM | POA: Diagnosis not present

## 2021-04-11 DIAGNOSIS — M6281 Muscle weakness (generalized): Secondary | ICD-10-CM | POA: Diagnosis not present

## 2021-04-11 DIAGNOSIS — M1711 Unilateral primary osteoarthritis, right knee: Secondary | ICD-10-CM | POA: Diagnosis not present

## 2021-04-11 DIAGNOSIS — M25661 Stiffness of right knee, not elsewhere classified: Secondary | ICD-10-CM | POA: Diagnosis not present

## 2021-04-11 DIAGNOSIS — R262 Difficulty in walking, not elsewhere classified: Secondary | ICD-10-CM | POA: Diagnosis not present

## 2021-04-16 DIAGNOSIS — R262 Difficulty in walking, not elsewhere classified: Secondary | ICD-10-CM | POA: Diagnosis not present

## 2021-04-16 DIAGNOSIS — M25661 Stiffness of right knee, not elsewhere classified: Secondary | ICD-10-CM | POA: Diagnosis not present

## 2021-04-16 DIAGNOSIS — M6281 Muscle weakness (generalized): Secondary | ICD-10-CM | POA: Diagnosis not present

## 2021-04-16 DIAGNOSIS — M1711 Unilateral primary osteoarthritis, right knee: Secondary | ICD-10-CM | POA: Diagnosis not present

## 2021-04-18 DIAGNOSIS — R262 Difficulty in walking, not elsewhere classified: Secondary | ICD-10-CM | POA: Diagnosis not present

## 2021-04-18 DIAGNOSIS — M25661 Stiffness of right knee, not elsewhere classified: Secondary | ICD-10-CM | POA: Diagnosis not present

## 2021-04-18 DIAGNOSIS — M1711 Unilateral primary osteoarthritis, right knee: Secondary | ICD-10-CM | POA: Diagnosis not present

## 2021-04-18 DIAGNOSIS — M6281 Muscle weakness (generalized): Secondary | ICD-10-CM | POA: Diagnosis not present

## 2021-04-25 DIAGNOSIS — M6281 Muscle weakness (generalized): Secondary | ICD-10-CM | POA: Diagnosis not present

## 2021-04-25 DIAGNOSIS — R262 Difficulty in walking, not elsewhere classified: Secondary | ICD-10-CM | POA: Diagnosis not present

## 2021-04-25 DIAGNOSIS — M1711 Unilateral primary osteoarthritis, right knee: Secondary | ICD-10-CM | POA: Diagnosis not present

## 2021-04-25 DIAGNOSIS — M25661 Stiffness of right knee, not elsewhere classified: Secondary | ICD-10-CM | POA: Diagnosis not present

## 2021-04-30 DIAGNOSIS — M1711 Unilateral primary osteoarthritis, right knee: Secondary | ICD-10-CM | POA: Diagnosis not present

## 2021-05-02 DIAGNOSIS — I10 Essential (primary) hypertension: Secondary | ICD-10-CM | POA: Diagnosis not present

## 2021-05-02 DIAGNOSIS — R7301 Impaired fasting glucose: Secondary | ICD-10-CM | POA: Diagnosis not present

## 2021-05-02 DIAGNOSIS — F329 Major depressive disorder, single episode, unspecified: Secondary | ICD-10-CM | POA: Diagnosis not present

## 2021-05-02 DIAGNOSIS — E538 Deficiency of other specified B group vitamins: Secondary | ICD-10-CM | POA: Diagnosis not present

## 2021-05-02 DIAGNOSIS — K219 Gastro-esophageal reflux disease without esophagitis: Secondary | ICD-10-CM | POA: Diagnosis not present

## 2021-05-02 DIAGNOSIS — R8290 Unspecified abnormal findings in urine: Secondary | ICD-10-CM | POA: Diagnosis not present

## 2021-05-02 DIAGNOSIS — R413 Other amnesia: Secondary | ICD-10-CM | POA: Diagnosis not present

## 2021-05-02 DIAGNOSIS — E785 Hyperlipidemia, unspecified: Secondary | ICD-10-CM | POA: Diagnosis not present

## 2021-05-02 DIAGNOSIS — K8020 Calculus of gallbladder without cholecystitis without obstruction: Secondary | ICD-10-CM | POA: Diagnosis not present

## 2021-05-02 DIAGNOSIS — N390 Urinary tract infection, site not specified: Secondary | ICD-10-CM | POA: Diagnosis not present

## 2021-05-03 DIAGNOSIS — M25661 Stiffness of right knee, not elsewhere classified: Secondary | ICD-10-CM | POA: Diagnosis not present

## 2021-05-03 DIAGNOSIS — M1711 Unilateral primary osteoarthritis, right knee: Secondary | ICD-10-CM | POA: Diagnosis not present

## 2021-05-03 DIAGNOSIS — R262 Difficulty in walking, not elsewhere classified: Secondary | ICD-10-CM | POA: Diagnosis not present

## 2021-05-03 DIAGNOSIS — M6281 Muscle weakness (generalized): Secondary | ICD-10-CM | POA: Diagnosis not present

## 2021-05-07 DIAGNOSIS — R262 Difficulty in walking, not elsewhere classified: Secondary | ICD-10-CM | POA: Diagnosis not present

## 2021-05-07 DIAGNOSIS — M25661 Stiffness of right knee, not elsewhere classified: Secondary | ICD-10-CM | POA: Diagnosis not present

## 2021-05-07 DIAGNOSIS — M1711 Unilateral primary osteoarthritis, right knee: Secondary | ICD-10-CM | POA: Diagnosis not present

## 2021-05-07 DIAGNOSIS — M6281 Muscle weakness (generalized): Secondary | ICD-10-CM | POA: Diagnosis not present

## 2021-05-09 DIAGNOSIS — M6281 Muscle weakness (generalized): Secondary | ICD-10-CM | POA: Diagnosis not present

## 2021-05-09 DIAGNOSIS — M1711 Unilateral primary osteoarthritis, right knee: Secondary | ICD-10-CM | POA: Diagnosis not present

## 2021-05-09 DIAGNOSIS — R262 Difficulty in walking, not elsewhere classified: Secondary | ICD-10-CM | POA: Diagnosis not present

## 2021-05-09 DIAGNOSIS — M25661 Stiffness of right knee, not elsewhere classified: Secondary | ICD-10-CM | POA: Diagnosis not present

## 2021-05-21 DIAGNOSIS — M1711 Unilateral primary osteoarthritis, right knee: Secondary | ICD-10-CM | POA: Diagnosis not present

## 2021-05-21 DIAGNOSIS — M25661 Stiffness of right knee, not elsewhere classified: Secondary | ICD-10-CM | POA: Diagnosis not present

## 2021-05-21 DIAGNOSIS — M6281 Muscle weakness (generalized): Secondary | ICD-10-CM | POA: Diagnosis not present

## 2021-05-21 DIAGNOSIS — R262 Difficulty in walking, not elsewhere classified: Secondary | ICD-10-CM | POA: Diagnosis not present

## 2021-05-23 DIAGNOSIS — M1711 Unilateral primary osteoarthritis, right knee: Secondary | ICD-10-CM | POA: Diagnosis not present

## 2021-05-23 DIAGNOSIS — M25661 Stiffness of right knee, not elsewhere classified: Secondary | ICD-10-CM | POA: Diagnosis not present

## 2021-05-23 DIAGNOSIS — M6281 Muscle weakness (generalized): Secondary | ICD-10-CM | POA: Diagnosis not present

## 2021-05-23 DIAGNOSIS — R262 Difficulty in walking, not elsewhere classified: Secondary | ICD-10-CM | POA: Diagnosis not present

## 2021-05-28 DIAGNOSIS — M1711 Unilateral primary osteoarthritis, right knee: Secondary | ICD-10-CM | POA: Diagnosis not present

## 2021-05-28 DIAGNOSIS — R262 Difficulty in walking, not elsewhere classified: Secondary | ICD-10-CM | POA: Diagnosis not present

## 2021-05-28 DIAGNOSIS — M25661 Stiffness of right knee, not elsewhere classified: Secondary | ICD-10-CM | POA: Diagnosis not present

## 2021-05-28 DIAGNOSIS — M6281 Muscle weakness (generalized): Secondary | ICD-10-CM | POA: Diagnosis not present

## 2021-06-04 DIAGNOSIS — M6281 Muscle weakness (generalized): Secondary | ICD-10-CM | POA: Diagnosis not present

## 2021-06-04 DIAGNOSIS — M25661 Stiffness of right knee, not elsewhere classified: Secondary | ICD-10-CM | POA: Diagnosis not present

## 2021-06-04 DIAGNOSIS — R262 Difficulty in walking, not elsewhere classified: Secondary | ICD-10-CM | POA: Diagnosis not present

## 2021-06-04 DIAGNOSIS — M1711 Unilateral primary osteoarthritis, right knee: Secondary | ICD-10-CM | POA: Diagnosis not present

## 2021-06-06 DIAGNOSIS — M25661 Stiffness of right knee, not elsewhere classified: Secondary | ICD-10-CM | POA: Diagnosis not present

## 2021-06-06 DIAGNOSIS — M1711 Unilateral primary osteoarthritis, right knee: Secondary | ICD-10-CM | POA: Diagnosis not present

## 2021-06-06 DIAGNOSIS — R262 Difficulty in walking, not elsewhere classified: Secondary | ICD-10-CM | POA: Diagnosis not present

## 2021-06-06 DIAGNOSIS — M6281 Muscle weakness (generalized): Secondary | ICD-10-CM | POA: Diagnosis not present

## 2021-07-28 DIAGNOSIS — Z23 Encounter for immunization: Secondary | ICD-10-CM | POA: Diagnosis not present

## 2021-08-07 ENCOUNTER — Encounter: Payer: Self-pay | Admitting: Internal Medicine

## 2021-08-07 ENCOUNTER — Ambulatory Visit (INDEPENDENT_AMBULATORY_CARE_PROVIDER_SITE_OTHER): Payer: Medicare Other | Admitting: Internal Medicine

## 2021-08-07 VITALS — BP 120/64 | HR 78 | Ht 61.0 in | Wt 151.0 lb

## 2021-08-07 DIAGNOSIS — K5900 Constipation, unspecified: Secondary | ICD-10-CM | POA: Diagnosis not present

## 2021-08-07 NOTE — Patient Instructions (Addendum)
Start Miralax as discussed with Dr. Henrene Pastor.   Follow up as needed.  If you are age 79 or older, your body mass index should be between 23-30. Your Body mass index is 28.53 kg/m. If this is out of the aforementioned range listed, please consider follow up with your Primary Care Provider.  If you are age 103 or younger, your body mass index should be between 19-25. Your Body mass index is 28.53 kg/m. If this is out of the aformentioned range listed, please consider follow up with your Primary Care Provider.   ________________________________________________________  The [facility] providers would like to encourage you to use [facility] to communicate with providers for non-urgent requests or questions.  Due to long hold times on the telephone, sending your provider a message by [facility] may be a faster and more efficient way to get a response.  Please allow 48 business hours for a response.  Please remember that this is for non-urgent requests.  _______________________________________________________

## 2021-08-07 NOTE — Progress Notes (Signed)
HISTORY OF PRESENT ILLNESS:  Colleen Glenn is a 80 y.o. female with multiple medical problems as listed below who presents today with her daughter regarding problems with constipation.  She was last seen in the office September 2019 regarding cholelithiasis, GERD, remote history of peptic ulcer disease, and a history of adenomatous colon polyps.  See that dictation.  I last saw the patient for colonoscopy September 2021.  She was found to have diverticulosis and a few benign scattered erosions.  Biopsies were negative.  Otherwise normal.  She was hospitalized in December with COVID.  Seen by GI regarding problems with nausea and vomiting.  She has since recovered.  She complains of chronic constipation.  She will take various agents to help.  Occasionally this results in diarrhea.  She describes rare episodes of incontinence.  No bleeding.  She has a bowel movement about every other day.  She does have abdominal discomfort with constipation.  She wonders about her gallbladder.  The presenting symptoms or not compatible with gallbladder disease.  Review of blood work from May 2022 shows normal hemoglobin of 12.1.  REVIEW OF SYSTEMS:  All non-GI ROS negative unless otherwise stated in the HPI except for arthritis  Past Medical History:  Diagnosis Date   Anxiety    on meds   Arthritis    OA bil shoulders, hands, back   Back pain    BPPV (benign paroxysmal positional vertigo) 10/2019   Bursitis    Colon polyps    adenomatous   Colon polyps    Depression    on meds   Diverticulosis    Frequent falls 03/03/2021   GERD (gastroesophageal reflux disease)    on meds   Hemorrhoids    Hyperlipidemia    on meds   Mild hypertension    white coat syndrome- on meds   Myocardial infarction ([place])    OAB (overactive bladder)    Osteopenia    Panic disorder 11/2012   Plantar fasciitis    hx of   PUD (peptic ulcer disease)    Shingles    Stroke ([place])    Takotsubo cardiomyopathy    Tremor     mild action tremor suspected 2020   Vitamin D deficiency    on meds    Past Surgical History:  Procedure Laterality Date   ABDOMINAL HYSTERECTOMY     APPENDECTOMY     BACK SURGERY     COLONOSCOPY  2016   JP-MAC-TA   FOOT SURGERY     bilateral    HEMORRHOID SURGERY     JOINT REPLACEMENT Right 2014   laproscopic knee surgery Right    2018   LEFT HEART CATH AND CORONARY ANGIOGRAPHY N/A 09/28/2020   Procedure: LEFT HEART CATH AND CORONARY ANGIOGRAPHY;  Surgeon: Sherren Mocha, MD;  Location: [facility];  Service: Cardiovascular;  Laterality: N/A;   LUMBAR LAMINECTOMY/DECOMPRESSION MICRODISCECTOMY  05/07/2012   Procedure: LUMBAR LAMINECTOMY/DECOMPRESSION MICRODISCECTOMY 1 LEVEL;  Surgeon: Eustace Moore, MD;  Location: [facility];  Service: Neurosurgery;  Laterality: Right;  Lumbar Laminectomy Decompression Microdiscectomy Lumbar Three-Four   multiple foot surgeries     POLYPECTOMY  2016   TA   REVERSE SHOULDER ARTHROPLASTY Left 07/12/2020   Procedure: REVERSE SHOULDER ARTHROPLASTY;  Surgeon: Hiram Gash, MD;  Location: [facility];  Service: Orthopedics;  Laterality: Left;   ROTATOR CUFF REPAIR     right side   TONSILLECTOMY     TOTAL HIP ARTHROPLASTY  11/16/2012  Procedure: TOTAL HIP ARTHROPLASTY;  Surgeon: Gearlean Alf, MD;  Location: [facility];  Service: Orthopedics;  Laterality: Right;  Right Total Hip Arthroplasty   TOTAL KNEE ARTHROPLASTY Right 03/20/2021   Procedure: TOTAL KNEE ARTHROPLASTY;  Surgeon: Renette Butters, MD;  Location: [facility];  Service: Orthopedics;  Laterality: Right;   TUBAL LIGATION     VAGOTOMY     approx. 35 years ago, states has a clamp mid epigastric area    Social History Colleen Glenn  reports that she quit smoking about 39 years ago. Her smoking use included cigarettes. She has a 20.00 pack-year smoking history. She has never used smokeless tobacco. She reports current alcohol use of about 2.0 - 4.0 standard drinks  per week. She reports that she does not use drugs.  family history includes Colon polyps in her mother; Congestive Heart Failure in her mother; Heart disease in her father; Hypertension in her mother; Lung cancer (age of onset: 26) in her father; Stroke in her mother and another family member.  No Known Allergies     PHYSICAL EXAMINATION: Vital signs: BP 120/64   Pulse 78   Ht _0  (1.549 m)   Wt 151 lb (68.5 kg)   SpO2 98%   BMI 28.53 kg/m   Constitutional: generally well-appearing, no acute distress Psychiatric: alert and oriented x3, cooperative Eyes: extraocular movements intact, anicteric, conjunctiva pink Mouth: oral pharynx moist, no lesions Neck: supple no lymphadenopathy Cardiovascular: heart regular rate and rhythm, no murmur Lungs: clear to auscultation bilaterally Abdomen: soft, nontender, nondistended, no obvious ascites, no peritoneal signs, normal bowel sounds, no organomegaly Rectal: Extremities: no lower extremity edema bilaterally Skin: no lesions on visible extremities Neuro: No focal deficits. No asterixis.     ASSESSMENT:  1.  Functional constipation 2.  Colonoscopy last year with diverticulosis 3.  GERD   PLAN:  1.  Recommended MiraLAX daily.  We discussed the proper way to titrate to achieve desired effect of 1-2 bowel movements per day.  They will contact me if they have any problems or issues. 2.  Resume general medical care with Dr. Joylene Draft  A total time of 30 minutes was spent preparing to see the patient, reviewing tests, obtaining comprehensive history, performing medically appropriate physical examination, counseling the patient and her daughter regarding her above listed issues, directing medical therapy, and documenting clinical information in the health record

## 2021-08-08 DIAGNOSIS — M1711 Unilateral primary osteoarthritis, right knee: Secondary | ICD-10-CM | POA: Diagnosis not present

## 2021-08-20 DIAGNOSIS — M859 Disorder of bone density and structure, unspecified: Secondary | ICD-10-CM | POA: Diagnosis not present

## 2021-08-20 DIAGNOSIS — R7301 Impaired fasting glucose: Secondary | ICD-10-CM | POA: Diagnosis not present

## 2021-08-20 DIAGNOSIS — E785 Hyperlipidemia, unspecified: Secondary | ICD-10-CM | POA: Diagnosis not present

## 2021-08-20 DIAGNOSIS — I10 Essential (primary) hypertension: Secondary | ICD-10-CM | POA: Diagnosis not present

## 2021-08-20 DIAGNOSIS — E538 Deficiency of other specified B group vitamins: Secondary | ICD-10-CM | POA: Diagnosis not present

## 2021-08-27 DIAGNOSIS — R413 Other amnesia: Secondary | ICD-10-CM | POA: Diagnosis not present

## 2021-08-27 DIAGNOSIS — N281 Cyst of kidney, acquired: Secondary | ICD-10-CM | POA: Diagnosis not present

## 2021-08-27 DIAGNOSIS — N318 Other neuromuscular dysfunction of bladder: Secondary | ICD-10-CM | POA: Diagnosis not present

## 2021-08-27 DIAGNOSIS — E785 Hyperlipidemia, unspecified: Secondary | ICD-10-CM | POA: Diagnosis not present

## 2021-08-27 DIAGNOSIS — I10 Essential (primary) hypertension: Secondary | ICD-10-CM | POA: Diagnosis not present

## 2021-08-27 DIAGNOSIS — K8020 Calculus of gallbladder without cholecystitis without obstruction: Secondary | ICD-10-CM | POA: Diagnosis not present

## 2021-08-27 DIAGNOSIS — M5136 Other intervertebral disc degeneration, lumbar region: Secondary | ICD-10-CM | POA: Diagnosis not present

## 2021-08-27 DIAGNOSIS — K8000 Calculus of gallbladder with acute cholecystitis without obstruction: Secondary | ICD-10-CM | POA: Diagnosis not present

## 2021-08-27 DIAGNOSIS — R82998 Other abnormal findings in urine: Secondary | ICD-10-CM | POA: Diagnosis not present

## 2021-08-27 DIAGNOSIS — Z0000 Encounter for general adult medical examination without abnormal findings: Secondary | ICD-10-CM | POA: Diagnosis not present

## 2021-08-27 DIAGNOSIS — Z1331 Encounter for screening for depression: Secondary | ICD-10-CM | POA: Diagnosis not present

## 2021-08-27 DIAGNOSIS — M1990 Unspecified osteoarthritis, unspecified site: Secondary | ICD-10-CM | POA: Diagnosis not present

## 2021-08-27 DIAGNOSIS — M25561 Pain in right knee: Secondary | ICD-10-CM | POA: Diagnosis not present

## 2021-08-27 DIAGNOSIS — M8580 Other specified disorders of bone density and structure, unspecified site: Secondary | ICD-10-CM | POA: Diagnosis not present

## 2021-08-27 DIAGNOSIS — Z23 Encounter for immunization: Secondary | ICD-10-CM | POA: Diagnosis not present

## 2021-08-27 DIAGNOSIS — Z1339 Encounter for screening examination for other mental health and behavioral disorders: Secondary | ICD-10-CM | POA: Diagnosis not present

## 2021-10-12 ENCOUNTER — Other Ambulatory Visit (HOSPITAL_COMMUNITY): Payer: Medicare Other

## 2021-10-24 ENCOUNTER — Ambulatory Visit: Admit: 2021-10-24 | Payer: Medicare Other | Admitting: Orthopedic Surgery

## 2021-10-24 SURGERY — ARTHROPLASTY, KNEE, TOTAL
Anesthesia: Choice | Site: Knee | Laterality: Right

## 2021-11-10 IMAGING — MR MR HEAD W/O CM
13 of 16 series · 35 of 48 positions shown · non-contrast
Comparison: MRI 10/20/2019.

CLINICAL DATA: TIA.  Slurred speech.



[Series 5: DWI · axial · 3.0mm · 1.36mm/px · z∈[-43,+110]mm · 4 of 104 slices shown (1 of 4)]
[im 1/104]
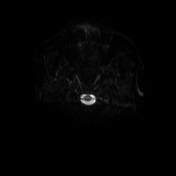
[im 35/104]
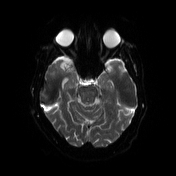
[im 69/104]
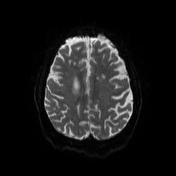
[im 104/104]
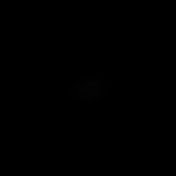

[Series 6: DWI · axial · 3.0mm · 1.36mm/px · z∈[-43,+110]mm · 3 of 52 slices shown (2 of 4)]
[im 1/52]
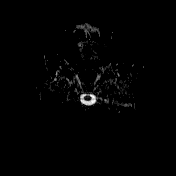
[im 26/52]
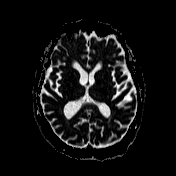
[im 52/52]
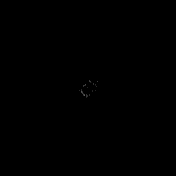

[Series 7: T1 · sagittal · 5.0mm · 0.75mm/px · 1 of 24 slices shown (1 of 2)]
[im 1/24]
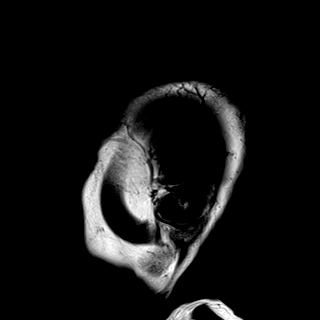

[Series 8: T2 · axial · 5.0mm · 0.62mm/px · 1 of 26 slices shown (1 of 2)]
[im 1/26]
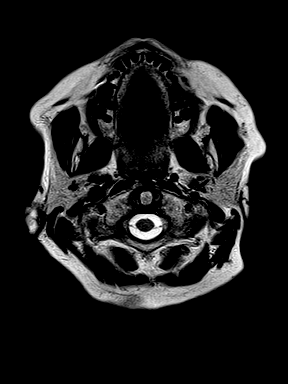

[Series 9: mip_images(sw) · axial · 24.0mm · 0.75mm/px · z∈[-37,+107]mm · 3 of 49 slices shown]
[im 1/49]
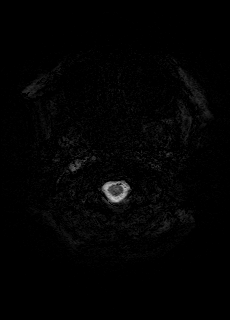
[im 25/49]
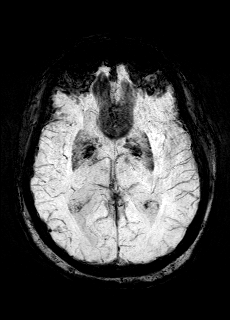
[im 49/49]
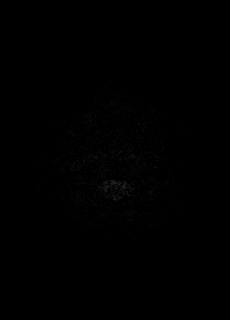

[Series 10: swi_images · axial · 3.0mm · 0.75mm/px · z∈[-48,+117]mm · 3 of 56 slices shown]
[im 1/56]
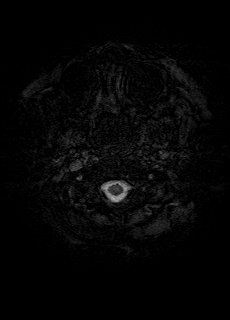
[im 28/56]
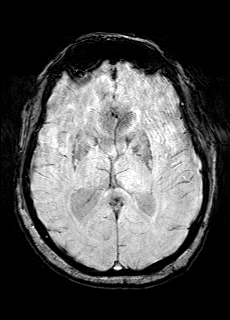
[im 56/56]
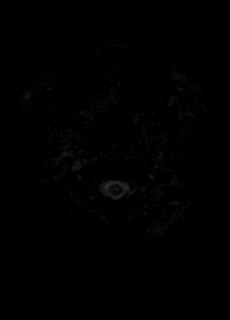

[Series 11: FLAIR · axial · 3.0mm · 0.86mm/px · z∈[-59,+97]mm · 3 of 53 slices shown]
[im 1/53]
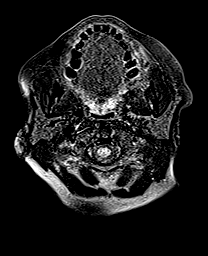
[im 27/53]
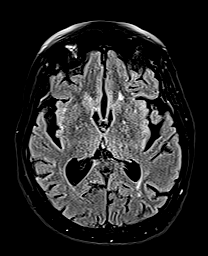
[im 53/53]
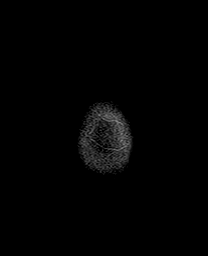

[Series 12: T1 · axial · 3.0mm · 0.45mm/px · z∈[-50,+106]mm · 3 of 53 slices shown (2 of 2)]
[im 1/53]
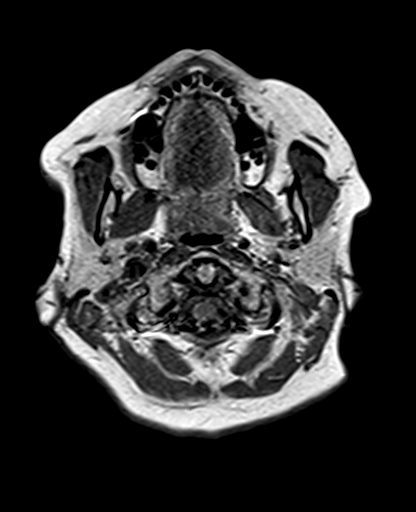
[im 27/53]
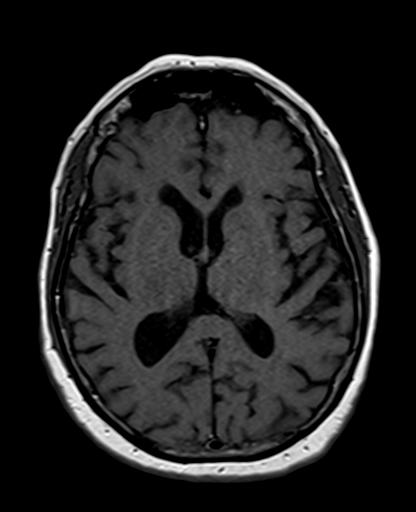
[im 53/53]
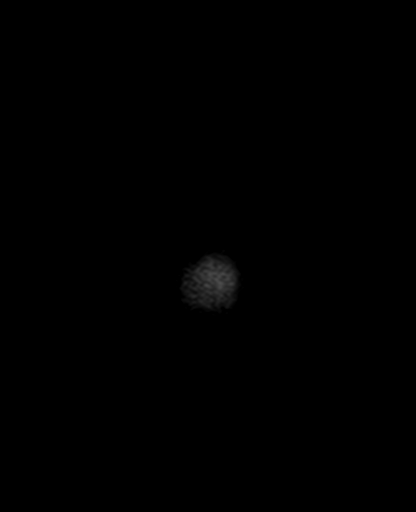

[Series 13: DWI · coronal · 5.0mm · 1.31mm/px · 3 of 67 slices shown (3 of 4)]
[im 1/67]
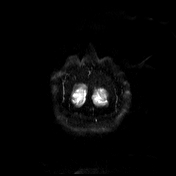
[im 34/67]
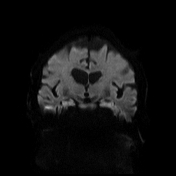
[im 67/67]
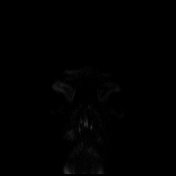

[Series 14: DWI · coronal · 5.0mm · 1.31mm/px · 2 of 34 slices shown (4 of 4)]
[im 1/34]
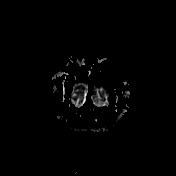
[im 34/34]
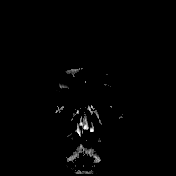

[Series 15: T2 · coronal · 5.0mm · 0.57mm/px · 2 of 32 slices shown (2 of 2)]
[im 1/32]
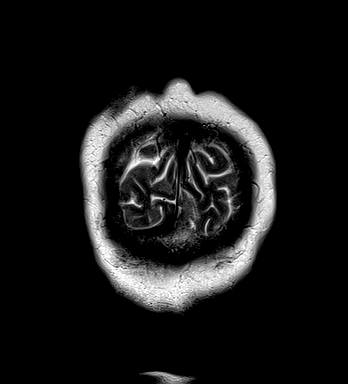
[im 32/32]
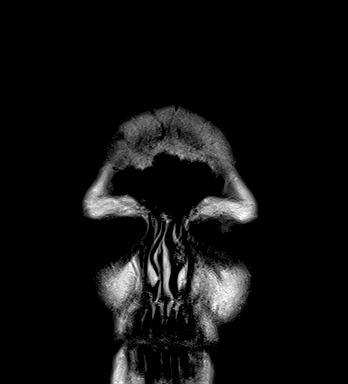

[Series 29: tof_2d_tra · axial · 3.5mm · 0.43mm/px · z∈[-168,-24]mm · 3 of 60 slices shown]
[im 1/60]
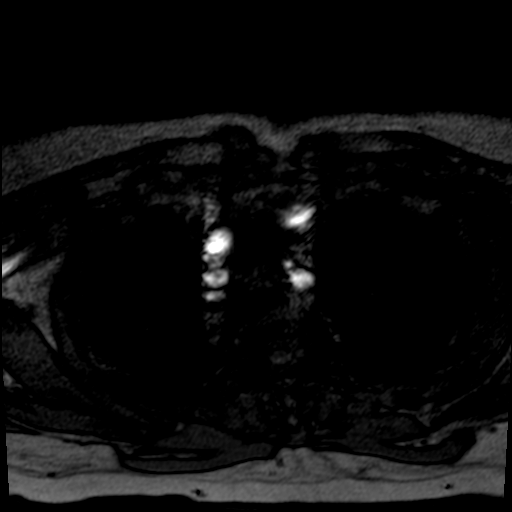
[im 30/60]
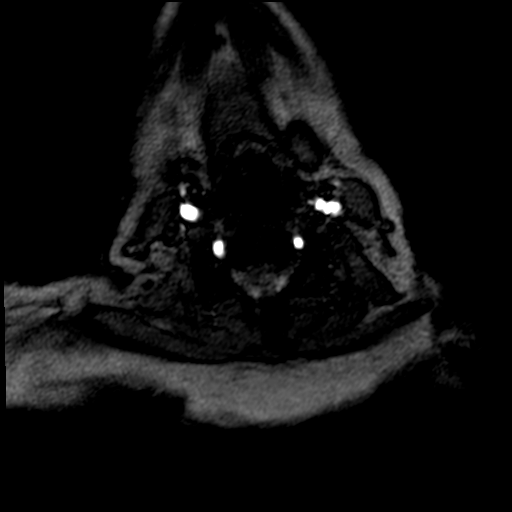
[im 60/60]
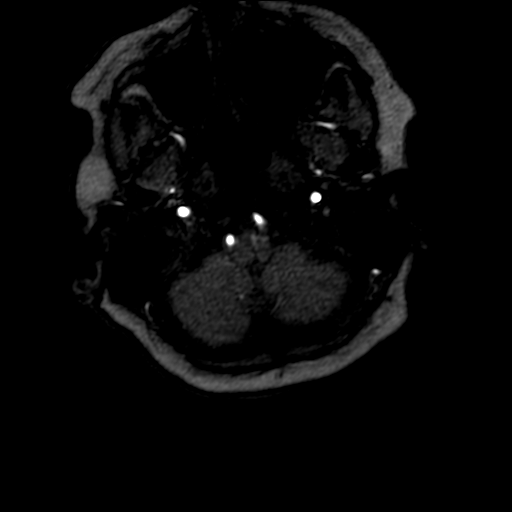

[Series 34: tof_3d_multi-slab-bifurcation · axial · 1.5mm · 0.26mm/px · z∈[-203,-94]mm · 4 of 124 slices shown]
[im 1/124]
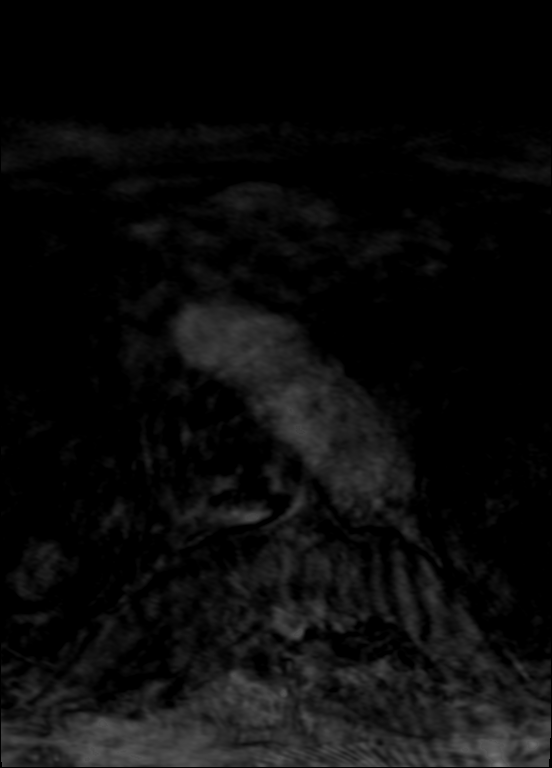
[im 25/124]
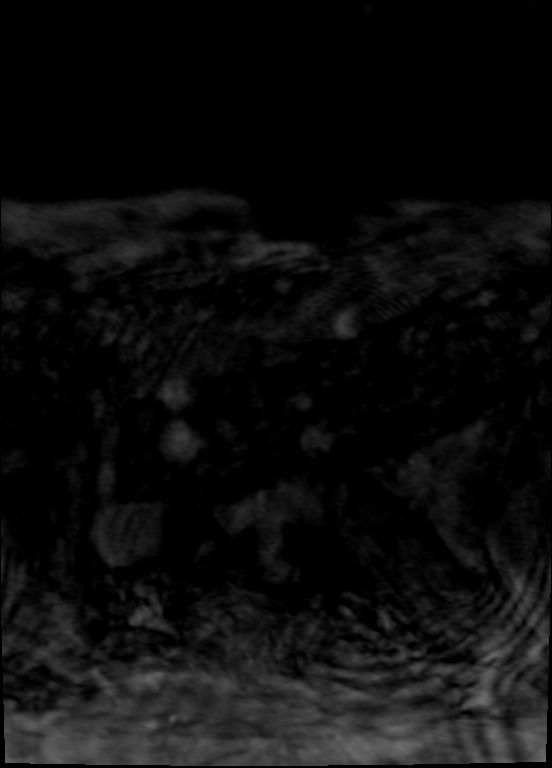
[im 50/124]
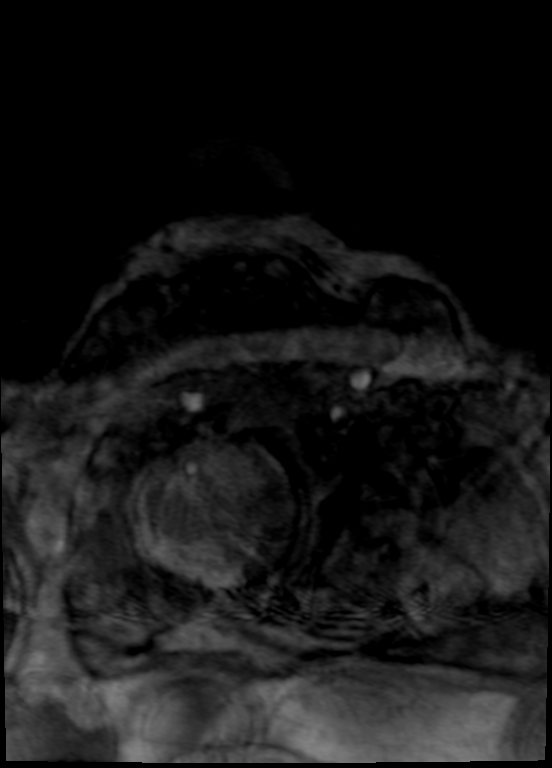
[im 74/124]
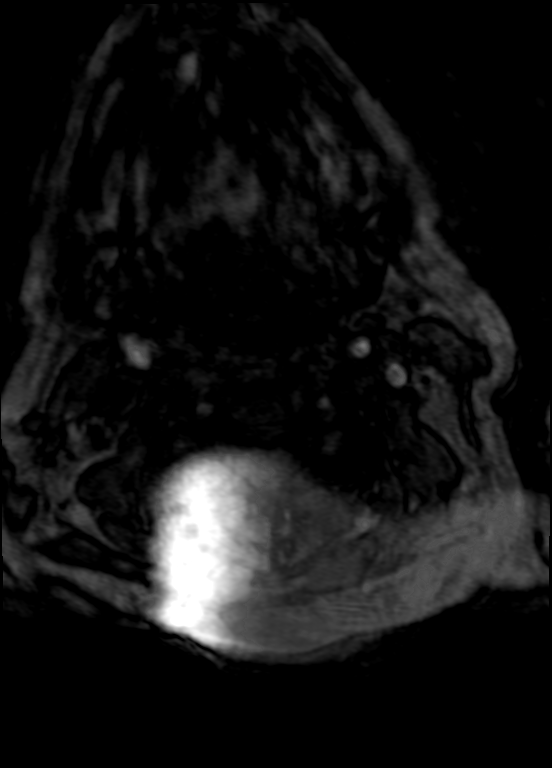

[35 of 48 positions shown; findings below may reference images not displayed]

FINDINGS: MRI HEAD FINDINGS

Brain: No acute infarction, hemorrhage, hydrocephalus, extra-axial
collection or mass lesion. Scattered T2/FLAIR hyperintensities
within the white matter and pons, compatible with chronic
microvascular ischemic disease. Generalized cerebral volume loss
with ex vacuo ventricular dilation.

Skull and upper cervical spine: Normal marrow signal.

Sinuses/Orbits: Mild scattered paranasal mucosal thickening.

Other: No mastoid effusions.

MRA HEAD FINDINGS

Anterior circulation: Bilateral ICAs are patent to the carotid
siphon without evidence of hemodynamically significant stenosis. The
proximal MCA and ACA arteries are patent without evidence of
hemodynamically significant stenosis or large vessel occlusion.
There is mild stenosis of the right A1/A2 ACA more distal branches
are poorly evaluated secondary to motion. No evidence of aneurysm.

Posterior circulation: Bilateral intradural vertebral arteries are
patent. Mild narrowing of the left intradural vertebral artery. The
basilar artery is within normal limits. Bilateral PCA arteries are
patent without evidence of proximal hemodynamically significant
stenosis or large vessel occlusion. No evidence of aneurysm.

MRA NECK FINDINGS

Evaluation is limited by motion with nondiagnostic evaluation of the
vessel origins and limited evaluation at the carotid bifurcations.
Bilateral vertebral arteries and carotid arteries are patent. No
visible flow limiting stenosis.
IMPRESSION: 1. No evidence of acute intracranial abnormality. Chronic
microvascular ischemic disease.
2. No emergent vascular finding or visible flow limiting stenosis in
the head or neck with evaluation in the neck limited by motion.

## 2021-11-27 DIAGNOSIS — H40033 Anatomical narrow angle, bilateral: Secondary | ICD-10-CM | POA: Diagnosis not present

## 2021-11-27 DIAGNOSIS — H1013 Acute atopic conjunctivitis, bilateral: Secondary | ICD-10-CM | POA: Diagnosis not present

## 2021-11-27 DIAGNOSIS — H04121 Dry eye syndrome of right lacrimal gland: Secondary | ICD-10-CM | POA: Diagnosis not present

## 2021-12-09 ENCOUNTER — Other Ambulatory Visit: Payer: Self-pay | Admitting: Physician Assistant

## 2021-12-18 DIAGNOSIS — M1990 Unspecified osteoarthritis, unspecified site: Secondary | ICD-10-CM | POA: Diagnosis not present

## 2021-12-18 DIAGNOSIS — E785 Hyperlipidemia, unspecified: Secondary | ICD-10-CM | POA: Diagnosis not present

## 2021-12-18 DIAGNOSIS — I10 Essential (primary) hypertension: Secondary | ICD-10-CM | POA: Diagnosis not present

## 2021-12-18 DIAGNOSIS — M8580 Other specified disorders of bone density and structure, unspecified site: Secondary | ICD-10-CM | POA: Diagnosis not present

## 2022-02-11 DIAGNOSIS — H1013 Acute atopic conjunctivitis, bilateral: Secondary | ICD-10-CM | POA: Diagnosis not present

## 2022-02-11 DIAGNOSIS — H40033 Anatomical narrow angle, bilateral: Secondary | ICD-10-CM | POA: Diagnosis not present

## 2022-02-11 DIAGNOSIS — H04121 Dry eye syndrome of right lacrimal gland: Secondary | ICD-10-CM | POA: Diagnosis not present

## 2022-02-11 DIAGNOSIS — H25813 Combined forms of age-related cataract, bilateral: Secondary | ICD-10-CM | POA: Diagnosis not present

## 2022-02-18 ENCOUNTER — Ambulatory Visit: Payer: Medicare Other | Admitting: Cardiovascular Disease

## 2022-02-28 DIAGNOSIS — R7301 Impaired fasting glucose: Secondary | ICD-10-CM | POA: Diagnosis not present

## 2022-02-28 DIAGNOSIS — N318 Other neuromuscular dysfunction of bladder: Secondary | ICD-10-CM | POA: Diagnosis not present

## 2022-02-28 DIAGNOSIS — R413 Other amnesia: Secondary | ICD-10-CM | POA: Diagnosis not present

## 2022-02-28 DIAGNOSIS — M5136 Other intervertebral disc degeneration, lumbar region: Secondary | ICD-10-CM | POA: Diagnosis not present

## 2022-02-28 DIAGNOSIS — E785 Hyperlipidemia, unspecified: Secondary | ICD-10-CM | POA: Diagnosis not present

## 2022-02-28 DIAGNOSIS — M8580 Other specified disorders of bone density and structure, unspecified site: Secondary | ICD-10-CM | POA: Diagnosis not present

## 2022-02-28 DIAGNOSIS — F329 Major depressive disorder, single episode, unspecified: Secondary | ICD-10-CM | POA: Diagnosis not present

## 2022-02-28 DIAGNOSIS — K219 Gastro-esophageal reflux disease without esophagitis: Secondary | ICD-10-CM | POA: Diagnosis not present

## 2022-02-28 DIAGNOSIS — I10 Essential (primary) hypertension: Secondary | ICD-10-CM | POA: Diagnosis not present

## 2022-02-28 DIAGNOSIS — R2689 Other abnormalities of gait and mobility: Secondary | ICD-10-CM | POA: Diagnosis not present

## 2022-02-28 DIAGNOSIS — E538 Deficiency of other specified B group vitamins: Secondary | ICD-10-CM | POA: Diagnosis not present

## 2022-02-28 DIAGNOSIS — M1990 Unspecified osteoarthritis, unspecified site: Secondary | ICD-10-CM | POA: Diagnosis not present

## 2022-03-12 ENCOUNTER — Other Ambulatory Visit: Payer: Self-pay | Admitting: Cardiovascular Disease

## 2022-03-22 ENCOUNTER — Ambulatory Visit: Payer: Medicare Other | Admitting: Cardiovascular Disease

## 2022-04-11 ENCOUNTER — Encounter: Payer: Self-pay | Admitting: Cardiovascular Disease

## 2022-04-11 ENCOUNTER — Ambulatory Visit (INDEPENDENT_AMBULATORY_CARE_PROVIDER_SITE_OTHER): Payer: Medicare Other | Admitting: Cardiovascular Disease

## 2022-04-11 VITALS — BP 114/62 | HR 77 | Ht 66.0 in | Wt 161.0 lb

## 2022-04-11 DIAGNOSIS — I10 Essential (primary) hypertension: Secondary | ICD-10-CM

## 2022-04-11 DIAGNOSIS — I5181 Takotsubo syndrome: Secondary | ICD-10-CM | POA: Diagnosis not present

## 2022-04-11 MED ORDER — ASPIRIN 81 MG PO TBEC: 81.0000 mg | DELAYED_RELEASE_TABLET | Freq: Every day | ORAL | 3 refills | Status: DC

## 2022-04-11 NOTE — Progress Notes (Signed)
Cardiology Office Note:    Date:  04/11/2022   ID:  Fantasha, Daniele 11/17/1940, MRN [id number]  PCP:  Crist Infante, MD   [facility] Providers Cardiologist:  Sherren Mocha, MD     Referring MD: Crist Infante, MD   Chief Complaint  Patient presents with   Follow-up    Takotsubo Syndrome    History of Present Illness:    Colleen Glenn is a 81 y.o. female with a hx of Takotsubo syndrome, presenting for follow-up evaluation.  The patient was hospitalized with acute Takotsubo syndrome in 2021.  She underwent emergency cardiac catheterization because it appeared she was having a STEMI, but cardiac cath showed nonobstructive CAD and ventriculography showed typical findings of acute Takotsubo syndrome with periapical ballooning.  Follow-up echo showed normalization of LV function with normal LVEF and no residual wall motion abnormalities.  She was last seen for cardiology follow-up in April 2022 at which time she was doing well.  The patient is here with her daughter today. Since I saw her last year, she had shoulder and knee replacement. She's had a good recovery from her surgeries.  She has excellent range of motion with her shoulder.  She still has some problems with her balance, but no recent falls.  She denies dyspnea, orthopnea, PND, or heart palpitations.  She has an occasional feeling of discomfort over the localized area in the left chest, unrelated to physical activity.  These episodes of been brief and self-limited.  Past Medical History:  Diagnosis Date   Anxiety    on meds   Arthritis    OA bil shoulders, hands, back   Back pain    BPPV (benign paroxysmal positional vertigo) 10/2019   Bursitis    Colon polyps    adenomatous   Colon polyps    Depression    on meds   Diverticulosis    Frequent falls 03/03/2021   GERD (gastroesophageal reflux disease)    on meds   Hemorrhoids    Hyperlipidemia    on meds   Mild hypertension    white coat syndrome- on  meds   Myocardial infarction ([place])    OAB (overactive bladder)    Osteopenia    Panic disorder 11/2012   Plantar fasciitis    hx of   PUD (peptic ulcer disease)    Shingles    Stroke ([place])    Takotsubo cardiomyopathy    Tremor    mild action tremor suspected 2020   Vitamin D deficiency    on meds    Past Surgical History:  Procedure Laterality Date   ABDOMINAL HYSTERECTOMY     APPENDECTOMY     BACK SURGERY     COLONOSCOPY  2016   JP-MAC-TA   FOOT SURGERY     bilateral    HEMORRHOID SURGERY     JOINT REPLACEMENT Right 2014   laproscopic knee surgery Right    2018   LEFT HEART CATH AND CORONARY ANGIOGRAPHY N/A 09/28/2020   Procedure: LEFT HEART CATH AND CORONARY ANGIOGRAPHY;  Surgeon: Sherren Mocha, MD;  Location: [place] [facility];  Service: Cardiovascular;  Laterality: N/A;   LUMBAR LAMINECTOMY/DECOMPRESSION MICRODISCECTOMY  05/07/2012   Procedure: LUMBAR LAMINECTOMY/DECOMPRESSION MICRODISCECTOMY 1 LEVEL;  Surgeon: Eustace Moore, MD;  Location: [place] [facility];  Service: Neurosurgery;  Laterality: Right;  Lumbar Laminectomy Decompression Microdiscectomy Lumbar Three-Four   multiple foot surgeries     POLYPECTOMY  2016   TA   REVERSE SHOULDER ARTHROPLASTY Left  07/12/2020   Procedure: REVERSE SHOULDER ARTHROPLASTY;  Surgeon: Hiram Gash, MD;  Location: [place];  Service: Orthopedics;  Laterality: Left;   ROTATOR CUFF REPAIR     right side   TONSILLECTOMY     TOTAL HIP ARTHROPLASTY  11/16/2012   Procedure: TOTAL HIP ARTHROPLASTY;  Surgeon: Gearlean Alf, MD;  Location: [facility];  Service: Orthopedics;  Laterality: Right;  Right Total Hip Arthroplasty   TOTAL KNEE ARTHROPLASTY Right 03/20/2021   Procedure: TOTAL KNEE ARTHROPLASTY;  Surgeon: Renette Butters, MD;  Location: [facility];  Service: Orthopedics;  Laterality: Right;   TUBAL LIGATION     VAGOTOMY     approx. 35 years ago, states has a clamp mid epigastric area    Current Medications: Current Meds   Medication Sig   acetaminophen (TYLENOL) 500 MG tablet Take 2 tablets (1,000 mg total) by mouth every 6 (six) hours as needed for mild pain or moderate pain.   aspirin EC 81 MG tablet Take 1 tablet (81 mg total) by mouth daily. Swallow whole.   buPROPion (WELLBUTRIN XL) 300 MG 24 hr tablet Take 300 mg by mouth daily before breakfast.    carvedilol (COREG) 12.5 MG tablet Take 1 tablet (12.5 mg total) by mouth 2 (two) times daily with a meal. Keep follow up appointment to get receive further refills.   Cholecalciferol (VITAMIN D-3) 125 MCG (5000 UT) TABS Take 5,000 Units by mouth daily after breakfast.   cyclobenzaprine (FLEXERIL) 5 MG tablet Take 5 mg by mouth 2 (two) times daily.   donepezil (ARICEPT) 10 MG tablet Take 10 mg by mouth at bedtime.   DULoxetine (CYMBALTA) 60 MG capsule Take 60 mg by mouth daily.   ezetimibe (ZETIA) 10 MG tablet Take 10 mg by mouth daily.   gabapentin (NEURONTIN) 300 MG capsule TAKE 1 CAPSULE(300 MG) BY MOUTH AT BEDTIME (Patient taking differently: Take 300 mg by mouth at bedtime.)   LORazepam (ATIVAN) 0.5 MG tablet Take 0.5 mg by mouth every 8 (eight) hours as needed for anxiety.   memantine (NAMENDA) 10 MG tablet Take 10 mg by mouth 2 (two) times daily.   MYRBETRIQ 50 MG TB24 tablet Take 50 mg by mouth daily.   pantoprazole (PROTONIX) 40 MG tablet TAKE 1 TABLET (40 MG TOTAL) BY MOUTH DAILY AT 6AM. (Patient taking differently: Take 40 mg by mouth daily.)   raloxifene (EVISTA) 60 MG tablet Take 60 mg by mouth daily.   simvastatin (ZOCOR) 40 MG tablet Take 40 mg by mouth daily.   vitamin B-12 (CYANOCOBALAMIN) 1000 MCG tablet Take 1,000 mcg by mouth daily.   [DISCONTINUED] aspirin EC 81 MG tablet Take 1 tablet (81 mg total) by mouth 2 (two) times daily. For DVT prophylaxis for 30 days after surgery.     Allergies:   Patient has no known allergies.   Social History   Socioeconomic History   Marital status: Widowed    Spouse name: Not on file   Number of  children: 3   Years of education: cosmetology degree   Highest education level: GED or equivalent  Occupational History   Occupation: Retired  Tobacco Use   Smoking status: Former    Packs/day: 1.00    Years: 20.00    Total pack years: 20.00    Types: Cigarettes    Quit date: 1983    Years since quitting: 40.4   Smokeless tobacco: Never   Tobacco comments:    45 years ago  Media pl[employer]  Vaping Use: Never used  Substance and Sexual Activity   Alcohol use: Yes    Alcohol/week: 2.0 - 4.0 standard drinks of alcohol    Types: 2 - 4 Glasses of wine per week    Comment: every 3-4 days   Drug use: No   Sexual activity: Not Currently    Partners: Male  Other Topics Concern   Not on file  Social History Narrative   Lives alone   Right handed   Social Determinants of Health   Financial Resource Strain: Not on file  Food Insecurity: Not on file  Transportation Needs: Not on file  Physical Activity: Not on file  Stress: Not on file  Social Connections: Not on file     Family History: The patient's family history includes Colon polyps in her mother; Congestive Heart Failure in her mother; Heart disease in her father; Hypertension in her mother; Lung cancer (age of onset: 80) in her father; Stroke in her mother and another family member. There is no history of Colon cancer, Rectal cancer, Stomach cancer, or Esophageal cancer.  ROS:   Please see the history of present illness.    All other systems reviewed and are negative.  EKGs/Labs/Other Studies Reviewed:    The following studies were reviewed today: Echo 01/23/21: 1. Left ventricular ejection fraction, by estimation, is 70 to 75%. The  left ventricle has hyperdynamic function. The left ventricle has no  regional wall motion abnormalities. There is moderate left ventricular  hypertrophy. Left ventricular diastolic  function could not be evaluated.   2. Right ventricular systolic function is normal. The right ventricular   size is normal. There is mildly elevated pulmonary artery systolic  pressure. The estimated right ventricular systolic pressure is 81.1 mmHg.   3. Left atrial size was mildly dilated.   4. The mitral valve is normal in structure. No evidence of mitral valve  regurgitation. No evidence of mitral stenosis.   5. The aortic valve is tricuspid. There is mild calcification of the  aortic valve. There is mild thickening of the aortic valve. Aortic valve  regurgitation is trivial. Mild aortic valve stenosis. Aortic valve area,  by VTI measures 1.98 cm. Aortic valve   mean gradient measures 9.5 mmHg. Aortic valve Vmax measures 2.30 m/s.   6. The inferior vena cava is normal in size with greater than 50%  respiratory variability, suggesting right atrial pressure of 3 mmHg.   Comparison(s): The left ventricular function has improved.   EKG:  EKG is ordered today.  The ekg ordered today demonstrates normal sinus rhythm 79 bpm, minimal voltage criteria for LVH may be normal variant, otherwise within normal limits.  Recent Labs: No results found for requested labs within last 365 days.  Recent Lipid Panel    Component Value Date/Time   CHOL 188 09/28/2020 1831   TRIG 128 09/28/2020 1831   HDL 44 09/28/2020 1831   CHOLHDL 4.3 09/28/2020 1831   VLDL 26 09/28/2020 1831   LDLCALC 118 (H) 09/28/2020 1831     Risk Assessment/Calculations:           Physical Exam:    VS:  BP 114/62   Pulse 77   Ht _0  (1.676 m)   Wt 161 lb (73 kg)   SpO2 98%   BMI 25.99 kg/m     Wt Readings from Last 3 Encounters:  04/11/22 161 lb (73 kg)  08/07/21 151 lb (68.5 kg)  03/20/21 155 lb 8 oz (70.5 kg)  GEN:  Well nourished, well developed pleasant elderly woman in no acute distress HEENT: Normal NECK: No JVD; No carotid bruits LYMPHATICS: No lymphadenopathy CARDIAC: RRR, no murmurs, rubs, gallops RESPIRATORY:  Clear to auscultation without rales, wheezing or rhonchi  ABDOMEN: Soft, non-tender,  non-distended MUSCULOSKELETAL:  No edema; No deformity  SKIN: Warm and dry NEUROLOGIC:  Alert and oriented x 3 PSYCHIATRIC:  Normal affect   ASSESSMENT:    1. Takotsubo syndrome   2. Essential hypertension    PLAN:    In order of problems listed above:  The patient has done well since her clinical event in 2021.  She had complete normalization of LV function on follow-up echo.  She has occasional chest discomfort that does not appear to be related to stress or to physical exertion.  She will continue on her current medications which include a beta-blocker and low-dose aspirin.  I will see her back in 1 year. Blood pressure is well controlled on carvedilol.      Medication Adjustments/Labs and Tests Ordered: Current medicines are reviewed at length with the patient today.  Concerns regarding medicines are outlined above.  Orders Placed This Encounter  Procedures   EKG 12-Lead   Meds ordered this encounter  Medications   aspirin EC 81 MG tablet    Sig: Take 1 tablet (81 mg total) by mouth daily. Swallow whole.    Dispense:  90 tablet    Refill:  3    Patient Instructions  Medication Instructions:  DECREASE Aspirin to once daily  *If you need a refill on your cardiac medications before your next appointment, please call your pharmacy*   Lab Work: NONE If you have labs (blood work) drawn today and your tests are completely normal, you will receive your results only by: [place] (if you have MyChart) OR A paper copy in the mail If you have any lab test that is abnormal or we need to change your treatment, we will call you to review the results.   Testing/Procedures: NONE   Follow-Up: At [facility], you and your health needs are our priority.  As part of our continuing mission to provide you with exceptional heart care, we have created designated Provider Care Teams.  These Care Teams include your primary Cardiologist (physician) and Advanced Practice  Providers (APPs -  Physician Assistants and Nurse Practitioners) who all work together to provide you with the care you need, when you need it.  Your next appointment:   1 year(s)  The format for your next appointment:   In Person  Provider:   Sherren Mocha, MD       Important Information About Sugar         Signed, Sherren Mocha, MD  04/11/2022 5:41 PM    [place]

## 2022-04-11 NOTE — Patient Instructions (Signed)
Medication Instructions:  DECREASE Aspirin to once daily  *If you need a refill on your cardiac medications before your next appointment, please call your pharmacy*   Lab Work: NONE If you have labs (blood work) drawn today and your tests are completely normal, you will receive your results only by: [place] (if you have MyChart) OR A paper copy in the mail If you have any lab test that is abnormal or we need to change your treatment, we will call you to review the results.   Testing/Procedures: NONE   Follow-Up: At [facility], you and your health needs are our priority.  As part of our continuing mission to provide you with exceptional heart care, we have created designated Provider Care Teams.  These Care Teams include your primary Cardiologist (physician) and Advanced Practice Providers (APPs -  Physician Assistants and Nurse Practitioners) who all work together to provide you with the care you need, when you need it.  Your next appointment:   1 year(s)  The format for your next appointment:   In Person  Provider:   Sherren Mocha, MD       Important Information About Sugar

## 2022-06-10 ENCOUNTER — Ambulatory Visit: Payer: Medicare Other | Admitting: Cardiovascular Disease

## 2022-06-15 ENCOUNTER — Other Ambulatory Visit: Payer: Self-pay | Admitting: Cardiovascular Disease

## 2022-06-19 ENCOUNTER — Ambulatory Visit: Payer: Medicare Other | Admitting: Cardiovascular Disease

## 2022-08-30 DIAGNOSIS — M545 Low back pain, unspecified: Secondary | ICD-10-CM | POA: Diagnosis not present

## 2022-08-30 DIAGNOSIS — M47896 Other spondylosis, lumbar region: Secondary | ICD-10-CM | POA: Diagnosis not present

## 2022-08-30 DIAGNOSIS — M533 Sacrococcygeal disorders, not elsewhere classified: Secondary | ICD-10-CM | POA: Diagnosis not present

## 2022-09-04 DIAGNOSIS — M533 Sacrococcygeal disorders, not elsewhere classified: Secondary | ICD-10-CM | POA: Diagnosis not present

## 2022-09-16 DIAGNOSIS — M8580 Other specified disorders of bone density and structure, unspecified site: Secondary | ICD-10-CM | POA: Diagnosis not present

## 2022-09-16 DIAGNOSIS — E538 Deficiency of other specified B group vitamins: Secondary | ICD-10-CM | POA: Diagnosis not present

## 2022-09-16 DIAGNOSIS — I10 Essential (primary) hypertension: Secondary | ICD-10-CM | POA: Diagnosis not present

## 2022-09-16 DIAGNOSIS — M25532 Pain in left wrist: Secondary | ICD-10-CM | POA: Diagnosis not present

## 2022-09-16 DIAGNOSIS — M79602 Pain in left arm: Secondary | ICD-10-CM | POA: Diagnosis not present

## 2022-09-16 DIAGNOSIS — E785 Hyperlipidemia, unspecified: Secondary | ICD-10-CM | POA: Diagnosis not present

## 2022-09-16 DIAGNOSIS — R609 Edema, unspecified: Secondary | ICD-10-CM | POA: Diagnosis not present

## 2022-09-16 DIAGNOSIS — R7301 Impaired fasting glucose: Secondary | ICD-10-CM | POA: Diagnosis not present

## 2022-09-19 DIAGNOSIS — S52592D Other fractures of lower end of left radius, subsequent encounter for closed fracture with routine healing: Secondary | ICD-10-CM | POA: Diagnosis not present

## 2022-09-19 DIAGNOSIS — M25532 Pain in left wrist: Secondary | ICD-10-CM | POA: Diagnosis not present

## 2022-09-23 DIAGNOSIS — N318 Other neuromuscular dysfunction of bladder: Secondary | ICD-10-CM | POA: Diagnosis not present

## 2022-09-23 DIAGNOSIS — R82998 Other abnormal findings in urine: Secondary | ICD-10-CM | POA: Diagnosis not present

## 2022-09-23 DIAGNOSIS — F329 Major depressive disorder, single episode, unspecified: Secondary | ICD-10-CM | POA: Diagnosis not present

## 2022-09-23 DIAGNOSIS — Z1339 Encounter for screening examination for other mental health and behavioral disorders: Secondary | ICD-10-CM | POA: Diagnosis not present

## 2022-09-23 DIAGNOSIS — R809 Proteinuria, unspecified: Secondary | ICD-10-CM | POA: Diagnosis not present

## 2022-09-23 DIAGNOSIS — Z0000 Encounter for general adult medical examination without abnormal findings: Secondary | ICD-10-CM | POA: Diagnosis not present

## 2022-09-23 DIAGNOSIS — W010XXA Fall on same level from slipping, tripping and stumbling without subsequent striking against object, initial encounter: Secondary | ICD-10-CM | POA: Diagnosis not present

## 2022-09-23 DIAGNOSIS — I10 Essential (primary) hypertension: Secondary | ICD-10-CM | POA: Diagnosis not present

## 2022-09-23 DIAGNOSIS — I499 Cardiac arrhythmia, unspecified: Secondary | ICD-10-CM | POA: Diagnosis not present

## 2022-09-23 DIAGNOSIS — K8020 Calculus of gallbladder without cholecystitis without obstruction: Secondary | ICD-10-CM | POA: Diagnosis not present

## 2022-09-23 DIAGNOSIS — M8580 Other specified disorders of bone density and structure, unspecified site: Secondary | ICD-10-CM | POA: Diagnosis not present

## 2022-09-23 DIAGNOSIS — Z1331 Encounter for screening for depression: Secondary | ICD-10-CM | POA: Diagnosis not present

## 2022-09-23 DIAGNOSIS — N281 Cyst of kidney, acquired: Secondary | ICD-10-CM | POA: Diagnosis not present

## 2022-09-23 DIAGNOSIS — R7301 Impaired fasting glucose: Secondary | ICD-10-CM | POA: Diagnosis not present

## 2022-09-23 DIAGNOSIS — Z23 Encounter for immunization: Secondary | ICD-10-CM | POA: Diagnosis not present

## 2022-09-23 DIAGNOSIS — R2689 Other abnormalities of gait and mobility: Secondary | ICD-10-CM | POA: Diagnosis not present

## 2022-09-29 DIAGNOSIS — W19XXXD Unspecified fall, subsequent encounter: Secondary | ICD-10-CM | POA: Diagnosis not present

## 2022-09-29 DIAGNOSIS — M10072 Idiopathic gout, left ankle and foot: Secondary | ICD-10-CM | POA: Diagnosis not present

## 2022-09-29 DIAGNOSIS — M19071 Primary osteoarthritis, right ankle and foot: Secondary | ICD-10-CM | POA: Diagnosis not present

## 2022-09-29 DIAGNOSIS — R7301 Impaired fasting glucose: Secondary | ICD-10-CM | POA: Diagnosis not present

## 2022-09-29 DIAGNOSIS — H8110 Benign paroxysmal vertigo, unspecified ear: Secondary | ICD-10-CM | POA: Diagnosis not present

## 2022-09-29 DIAGNOSIS — M84432D Pathological fracture, left ulna, subsequent encounter for fracture with routine healing: Secondary | ICD-10-CM | POA: Diagnosis not present

## 2022-09-29 DIAGNOSIS — K219 Gastro-esophageal reflux disease without esophagitis: Secondary | ICD-10-CM | POA: Diagnosis not present

## 2022-09-29 DIAGNOSIS — M8580 Other specified disorders of bone density and structure, unspecified site: Secondary | ICD-10-CM | POA: Diagnosis not present

## 2022-09-29 DIAGNOSIS — E785 Hyperlipidemia, unspecified: Secondary | ICD-10-CM | POA: Diagnosis not present

## 2022-09-29 DIAGNOSIS — M719 Bursopathy, unspecified: Secondary | ICD-10-CM | POA: Diagnosis not present

## 2022-09-29 DIAGNOSIS — M5136 Other intervertebral disc degeneration, lumbar region: Secondary | ICD-10-CM | POA: Diagnosis not present

## 2022-09-29 DIAGNOSIS — E559 Vitamin D deficiency, unspecified: Secondary | ICD-10-CM | POA: Diagnosis not present

## 2022-10-01 DIAGNOSIS — S52592D Other fractures of lower end of left radius, subsequent encounter for closed fracture with routine healing: Secondary | ICD-10-CM | POA: Diagnosis not present

## 2022-10-01 DIAGNOSIS — M25532 Pain in left wrist: Secondary | ICD-10-CM | POA: Diagnosis not present

## 2022-10-10 DIAGNOSIS — M25532 Pain in left wrist: Secondary | ICD-10-CM | POA: Diagnosis not present

## 2022-10-10 DIAGNOSIS — M79641 Pain in right hand: Secondary | ICD-10-CM | POA: Diagnosis not present

## 2022-10-10 DIAGNOSIS — S52592D Other fractures of lower end of left radius, subsequent encounter for closed fracture with routine healing: Secondary | ICD-10-CM | POA: Diagnosis not present

## 2022-10-23 DIAGNOSIS — R7301 Impaired fasting glucose: Secondary | ICD-10-CM | POA: Diagnosis not present

## 2022-10-23 DIAGNOSIS — K219 Gastro-esophageal reflux disease without esophagitis: Secondary | ICD-10-CM | POA: Diagnosis not present

## 2022-10-23 DIAGNOSIS — K8020 Calculus of gallbladder without cholecystitis without obstruction: Secondary | ICD-10-CM | POA: Diagnosis not present

## 2022-10-23 DIAGNOSIS — R413 Other amnesia: Secondary | ICD-10-CM | POA: Diagnosis not present

## 2022-10-23 DIAGNOSIS — M84432D Pathological fracture, left ulna, subsequent encounter for fracture with routine healing: Secondary | ICD-10-CM | POA: Diagnosis not present

## 2022-10-23 DIAGNOSIS — D126 Benign neoplasm of colon, unspecified: Secondary | ICD-10-CM | POA: Diagnosis not present

## 2022-10-23 DIAGNOSIS — W19XXXD Unspecified fall, subsequent encounter: Secondary | ICD-10-CM | POA: Diagnosis not present

## 2022-10-23 DIAGNOSIS — I10 Essential (primary) hypertension: Secondary | ICD-10-CM | POA: Diagnosis not present

## 2022-10-23 DIAGNOSIS — M19071 Primary osteoarthritis, right ankle and foot: Secondary | ICD-10-CM | POA: Diagnosis not present

## 2022-10-23 DIAGNOSIS — N3281 Overactive bladder: Secondary | ICD-10-CM | POA: Diagnosis not present

## 2022-10-23 DIAGNOSIS — I499 Cardiac arrhythmia, unspecified: Secondary | ICD-10-CM | POA: Diagnosis not present

## 2022-10-23 DIAGNOSIS — E559 Vitamin D deficiency, unspecified: Secondary | ICD-10-CM | POA: Diagnosis not present

## 2022-10-23 DIAGNOSIS — M10072 Idiopathic gout, left ankle and foot: Secondary | ICD-10-CM | POA: Diagnosis not present

## 2022-10-23 DIAGNOSIS — E663 Overweight: Secondary | ICD-10-CM | POA: Diagnosis not present

## 2022-10-23 DIAGNOSIS — K259 Gastric ulcer, unspecified as acute or chronic, without hemorrhage or perforation: Secondary | ICD-10-CM | POA: Diagnosis not present

## 2022-10-23 DIAGNOSIS — F410 Panic disorder [episodic paroxysmal anxiety] without agoraphobia: Secondary | ICD-10-CM | POA: Diagnosis not present

## 2022-10-23 DIAGNOSIS — E785 Hyperlipidemia, unspecified: Secondary | ICD-10-CM | POA: Diagnosis not present

## 2022-10-23 DIAGNOSIS — F329 Major depressive disorder, single episode, unspecified: Secondary | ICD-10-CM | POA: Diagnosis not present

## 2022-10-23 DIAGNOSIS — M8580 Other specified disorders of bone density and structure, unspecified site: Secondary | ICD-10-CM | POA: Diagnosis not present

## 2022-10-23 DIAGNOSIS — H8110 Benign paroxysmal vertigo, unspecified ear: Secondary | ICD-10-CM | POA: Diagnosis not present

## 2022-10-23 DIAGNOSIS — M719 Bursopathy, unspecified: Secondary | ICD-10-CM | POA: Diagnosis not present

## 2022-10-23 DIAGNOSIS — M5136 Other intervertebral disc degeneration, lumbar region: Secondary | ICD-10-CM | POA: Diagnosis not present

## 2022-10-23 DIAGNOSIS — K5730 Diverticulosis of large intestine without perforation or abscess without bleeding: Secondary | ICD-10-CM | POA: Diagnosis not present

## 2022-10-23 DIAGNOSIS — Z9181 History of falling: Secondary | ICD-10-CM | POA: Diagnosis not present

## 2022-10-23 DIAGNOSIS — I5181 Takotsubo syndrome: Secondary | ICD-10-CM | POA: Diagnosis not present

## 2022-10-25 DIAGNOSIS — M19071 Primary osteoarthritis, right ankle and foot: Secondary | ICD-10-CM | POA: Diagnosis not present

## 2022-10-25 DIAGNOSIS — W19XXXD Unspecified fall, subsequent encounter: Secondary | ICD-10-CM | POA: Diagnosis not present

## 2022-10-25 DIAGNOSIS — I499 Cardiac arrhythmia, unspecified: Secondary | ICD-10-CM | POA: Diagnosis not present

## 2022-10-25 DIAGNOSIS — M8580 Other specified disorders of bone density and structure, unspecified site: Secondary | ICD-10-CM | POA: Diagnosis not present

## 2022-10-25 DIAGNOSIS — R7301 Impaired fasting glucose: Secondary | ICD-10-CM | POA: Diagnosis not present

## 2022-10-25 DIAGNOSIS — M84432D Pathological fracture, left ulna, subsequent encounter for fracture with routine healing: Secondary | ICD-10-CM | POA: Diagnosis not present

## 2022-10-25 DIAGNOSIS — M719 Bursopathy, unspecified: Secondary | ICD-10-CM | POA: Diagnosis not present

## 2022-10-25 DIAGNOSIS — K5730 Diverticulosis of large intestine without perforation or abscess without bleeding: Secondary | ICD-10-CM | POA: Diagnosis not present

## 2022-10-25 DIAGNOSIS — I5181 Takotsubo syndrome: Secondary | ICD-10-CM | POA: Diagnosis not present

## 2022-10-25 DIAGNOSIS — I10 Essential (primary) hypertension: Secondary | ICD-10-CM | POA: Diagnosis not present

## 2022-10-25 DIAGNOSIS — M10072 Idiopathic gout, left ankle and foot: Secondary | ICD-10-CM | POA: Diagnosis not present

## 2022-10-25 DIAGNOSIS — E785 Hyperlipidemia, unspecified: Secondary | ICD-10-CM | POA: Diagnosis not present

## 2022-10-25 DIAGNOSIS — M5136 Other intervertebral disc degeneration, lumbar region: Secondary | ICD-10-CM | POA: Diagnosis not present

## 2022-10-25 DIAGNOSIS — K219 Gastro-esophageal reflux disease without esophagitis: Secondary | ICD-10-CM | POA: Diagnosis not present

## 2022-10-25 DIAGNOSIS — H8110 Benign paroxysmal vertigo, unspecified ear: Secondary | ICD-10-CM | POA: Diagnosis not present

## 2022-10-25 DIAGNOSIS — E559 Vitamin D deficiency, unspecified: Secondary | ICD-10-CM | POA: Diagnosis not present

## 2022-10-30 DIAGNOSIS — M84432D Pathological fracture, left ulna, subsequent encounter for fracture with routine healing: Secondary | ICD-10-CM | POA: Diagnosis not present

## 2022-10-30 DIAGNOSIS — M19071 Primary osteoarthritis, right ankle and foot: Secondary | ICD-10-CM | POA: Diagnosis not present

## 2022-10-30 DIAGNOSIS — H8110 Benign paroxysmal vertigo, unspecified ear: Secondary | ICD-10-CM | POA: Diagnosis not present

## 2022-10-30 DIAGNOSIS — M719 Bursopathy, unspecified: Secondary | ICD-10-CM | POA: Diagnosis not present

## 2022-10-30 DIAGNOSIS — I10 Essential (primary) hypertension: Secondary | ICD-10-CM | POA: Diagnosis not present

## 2022-10-30 DIAGNOSIS — E785 Hyperlipidemia, unspecified: Secondary | ICD-10-CM | POA: Diagnosis not present

## 2022-10-30 DIAGNOSIS — I499 Cardiac arrhythmia, unspecified: Secondary | ICD-10-CM | POA: Diagnosis not present

## 2022-10-30 DIAGNOSIS — M8580 Other specified disorders of bone density and structure, unspecified site: Secondary | ICD-10-CM | POA: Diagnosis not present

## 2022-10-30 DIAGNOSIS — I5181 Takotsubo syndrome: Secondary | ICD-10-CM | POA: Diagnosis not present

## 2022-10-30 DIAGNOSIS — K219 Gastro-esophageal reflux disease without esophagitis: Secondary | ICD-10-CM | POA: Diagnosis not present

## 2022-10-30 DIAGNOSIS — W19XXXD Unspecified fall, subsequent encounter: Secondary | ICD-10-CM | POA: Diagnosis not present

## 2022-10-30 DIAGNOSIS — R7301 Impaired fasting glucose: Secondary | ICD-10-CM | POA: Diagnosis not present

## 2022-10-30 DIAGNOSIS — M5136 Other intervertebral disc degeneration, lumbar region: Secondary | ICD-10-CM | POA: Diagnosis not present

## 2022-10-30 DIAGNOSIS — K5730 Diverticulosis of large intestine without perforation or abscess without bleeding: Secondary | ICD-10-CM | POA: Diagnosis not present

## 2022-10-30 DIAGNOSIS — M10072 Idiopathic gout, left ankle and foot: Secondary | ICD-10-CM | POA: Diagnosis not present

## 2022-10-30 DIAGNOSIS — E559 Vitamin D deficiency, unspecified: Secondary | ICD-10-CM | POA: Diagnosis not present

## 2022-10-31 DIAGNOSIS — S52592D Other fractures of lower end of left radius, subsequent encounter for closed fracture with routine healing: Secondary | ICD-10-CM | POA: Diagnosis not present

## 2022-10-31 DIAGNOSIS — M79641 Pain in right hand: Secondary | ICD-10-CM | POA: Diagnosis not present

## 2022-10-31 DIAGNOSIS — M25532 Pain in left wrist: Secondary | ICD-10-CM | POA: Diagnosis not present

## 2022-11-01 DIAGNOSIS — W19XXXD Unspecified fall, subsequent encounter: Secondary | ICD-10-CM | POA: Diagnosis not present

## 2022-11-01 DIAGNOSIS — M19071 Primary osteoarthritis, right ankle and foot: Secondary | ICD-10-CM | POA: Diagnosis not present

## 2022-11-01 DIAGNOSIS — M719 Bursopathy, unspecified: Secondary | ICD-10-CM | POA: Diagnosis not present

## 2022-11-01 DIAGNOSIS — M84432D Pathological fracture, left ulna, subsequent encounter for fracture with routine healing: Secondary | ICD-10-CM | POA: Diagnosis not present

## 2022-11-01 DIAGNOSIS — I5181 Takotsubo syndrome: Secondary | ICD-10-CM | POA: Diagnosis not present

## 2022-11-01 DIAGNOSIS — R7301 Impaired fasting glucose: Secondary | ICD-10-CM | POA: Diagnosis not present

## 2022-11-01 DIAGNOSIS — K219 Gastro-esophageal reflux disease without esophagitis: Secondary | ICD-10-CM | POA: Diagnosis not present

## 2022-11-01 DIAGNOSIS — K5730 Diverticulosis of large intestine without perforation or abscess without bleeding: Secondary | ICD-10-CM | POA: Diagnosis not present

## 2022-11-01 DIAGNOSIS — E785 Hyperlipidemia, unspecified: Secondary | ICD-10-CM | POA: Diagnosis not present

## 2022-11-01 DIAGNOSIS — E559 Vitamin D deficiency, unspecified: Secondary | ICD-10-CM | POA: Diagnosis not present

## 2022-11-01 DIAGNOSIS — I499 Cardiac arrhythmia, unspecified: Secondary | ICD-10-CM | POA: Diagnosis not present

## 2022-11-01 DIAGNOSIS — M5136 Other intervertebral disc degeneration, lumbar region: Secondary | ICD-10-CM | POA: Diagnosis not present

## 2022-11-01 DIAGNOSIS — M10072 Idiopathic gout, left ankle and foot: Secondary | ICD-10-CM | POA: Diagnosis not present

## 2022-11-01 DIAGNOSIS — H8110 Benign paroxysmal vertigo, unspecified ear: Secondary | ICD-10-CM | POA: Diagnosis not present

## 2022-11-01 DIAGNOSIS — M8580 Other specified disorders of bone density and structure, unspecified site: Secondary | ICD-10-CM | POA: Diagnosis not present

## 2022-11-01 DIAGNOSIS — I10 Essential (primary) hypertension: Secondary | ICD-10-CM | POA: Diagnosis not present

## 2022-11-04 DIAGNOSIS — W19XXXD Unspecified fall, subsequent encounter: Secondary | ICD-10-CM | POA: Diagnosis not present

## 2022-11-04 DIAGNOSIS — M5136 Other intervertebral disc degeneration, lumbar region: Secondary | ICD-10-CM | POA: Diagnosis not present

## 2022-11-04 DIAGNOSIS — M8580 Other specified disorders of bone density and structure, unspecified site: Secondary | ICD-10-CM | POA: Diagnosis not present

## 2022-11-04 DIAGNOSIS — M19071 Primary osteoarthritis, right ankle and foot: Secondary | ICD-10-CM | POA: Diagnosis not present

## 2022-11-04 DIAGNOSIS — I5181 Takotsubo syndrome: Secondary | ICD-10-CM | POA: Diagnosis not present

## 2022-11-04 DIAGNOSIS — M10072 Idiopathic gout, left ankle and foot: Secondary | ICD-10-CM | POA: Diagnosis not present

## 2022-11-04 DIAGNOSIS — H8110 Benign paroxysmal vertigo, unspecified ear: Secondary | ICD-10-CM | POA: Diagnosis not present

## 2022-11-04 DIAGNOSIS — M719 Bursopathy, unspecified: Secondary | ICD-10-CM | POA: Diagnosis not present

## 2022-11-04 DIAGNOSIS — K219 Gastro-esophageal reflux disease without esophagitis: Secondary | ICD-10-CM | POA: Diagnosis not present

## 2022-11-04 DIAGNOSIS — E559 Vitamin D deficiency, unspecified: Secondary | ICD-10-CM | POA: Diagnosis not present

## 2022-11-04 DIAGNOSIS — I499 Cardiac arrhythmia, unspecified: Secondary | ICD-10-CM | POA: Diagnosis not present

## 2022-11-04 DIAGNOSIS — R7301 Impaired fasting glucose: Secondary | ICD-10-CM | POA: Diagnosis not present

## 2022-11-04 DIAGNOSIS — K5730 Diverticulosis of large intestine without perforation or abscess without bleeding: Secondary | ICD-10-CM | POA: Diagnosis not present

## 2022-11-04 DIAGNOSIS — I10 Essential (primary) hypertension: Secondary | ICD-10-CM | POA: Diagnosis not present

## 2022-11-04 DIAGNOSIS — M84432D Pathological fracture, left ulna, subsequent encounter for fracture with routine healing: Secondary | ICD-10-CM | POA: Diagnosis not present

## 2022-11-04 DIAGNOSIS — E785 Hyperlipidemia, unspecified: Secondary | ICD-10-CM | POA: Diagnosis not present

## 2022-11-07 DIAGNOSIS — M5136 Other intervertebral disc degeneration, lumbar region: Secondary | ICD-10-CM | POA: Diagnosis not present

## 2022-11-07 DIAGNOSIS — I5181 Takotsubo syndrome: Secondary | ICD-10-CM | POA: Diagnosis not present

## 2022-11-07 DIAGNOSIS — W19XXXD Unspecified fall, subsequent encounter: Secondary | ICD-10-CM | POA: Diagnosis not present

## 2022-11-07 DIAGNOSIS — K219 Gastro-esophageal reflux disease without esophagitis: Secondary | ICD-10-CM | POA: Diagnosis not present

## 2022-11-07 DIAGNOSIS — M10072 Idiopathic gout, left ankle and foot: Secondary | ICD-10-CM | POA: Diagnosis not present

## 2022-11-07 DIAGNOSIS — E785 Hyperlipidemia, unspecified: Secondary | ICD-10-CM | POA: Diagnosis not present

## 2022-11-07 DIAGNOSIS — I10 Essential (primary) hypertension: Secondary | ICD-10-CM | POA: Diagnosis not present

## 2022-11-07 DIAGNOSIS — M19071 Primary osteoarthritis, right ankle and foot: Secondary | ICD-10-CM | POA: Diagnosis not present

## 2022-11-07 DIAGNOSIS — M719 Bursopathy, unspecified: Secondary | ICD-10-CM | POA: Diagnosis not present

## 2022-11-07 DIAGNOSIS — M84432D Pathological fracture, left ulna, subsequent encounter for fracture with routine healing: Secondary | ICD-10-CM | POA: Diagnosis not present

## 2022-11-07 DIAGNOSIS — M8580 Other specified disorders of bone density and structure, unspecified site: Secondary | ICD-10-CM | POA: Diagnosis not present

## 2022-11-07 DIAGNOSIS — H8110 Benign paroxysmal vertigo, unspecified ear: Secondary | ICD-10-CM | POA: Diagnosis not present

## 2022-11-07 DIAGNOSIS — K5730 Diverticulosis of large intestine without perforation or abscess without bleeding: Secondary | ICD-10-CM | POA: Diagnosis not present

## 2022-11-07 DIAGNOSIS — E559 Vitamin D deficiency, unspecified: Secondary | ICD-10-CM | POA: Diagnosis not present

## 2022-11-07 DIAGNOSIS — R7301 Impaired fasting glucose: Secondary | ICD-10-CM | POA: Diagnosis not present

## 2022-11-07 DIAGNOSIS — I499 Cardiac arrhythmia, unspecified: Secondary | ICD-10-CM | POA: Diagnosis not present

## 2022-11-08 DIAGNOSIS — M719 Bursopathy, unspecified: Secondary | ICD-10-CM | POA: Diagnosis not present

## 2022-11-08 DIAGNOSIS — I10 Essential (primary) hypertension: Secondary | ICD-10-CM | POA: Diagnosis not present

## 2022-11-08 DIAGNOSIS — M5136 Other intervertebral disc degeneration, lumbar region: Secondary | ICD-10-CM | POA: Diagnosis not present

## 2022-11-08 DIAGNOSIS — M84432D Pathological fracture, left ulna, subsequent encounter for fracture with routine healing: Secondary | ICD-10-CM | POA: Diagnosis not present

## 2022-11-08 DIAGNOSIS — M8580 Other specified disorders of bone density and structure, unspecified site: Secondary | ICD-10-CM | POA: Diagnosis not present

## 2022-11-08 DIAGNOSIS — M10072 Idiopathic gout, left ankle and foot: Secondary | ICD-10-CM | POA: Diagnosis not present

## 2022-11-08 DIAGNOSIS — E785 Hyperlipidemia, unspecified: Secondary | ICD-10-CM | POA: Diagnosis not present

## 2022-11-08 DIAGNOSIS — K219 Gastro-esophageal reflux disease without esophagitis: Secondary | ICD-10-CM | POA: Diagnosis not present

## 2022-11-08 DIAGNOSIS — K5730 Diverticulosis of large intestine without perforation or abscess without bleeding: Secondary | ICD-10-CM | POA: Diagnosis not present

## 2022-11-08 DIAGNOSIS — W19XXXD Unspecified fall, subsequent encounter: Secondary | ICD-10-CM | POA: Diagnosis not present

## 2022-11-08 DIAGNOSIS — H8110 Benign paroxysmal vertigo, unspecified ear: Secondary | ICD-10-CM | POA: Diagnosis not present

## 2022-11-08 DIAGNOSIS — M19071 Primary osteoarthritis, right ankle and foot: Secondary | ICD-10-CM | POA: Diagnosis not present

## 2022-11-08 DIAGNOSIS — E559 Vitamin D deficiency, unspecified: Secondary | ICD-10-CM | POA: Diagnosis not present

## 2022-11-08 DIAGNOSIS — R7301 Impaired fasting glucose: Secondary | ICD-10-CM | POA: Diagnosis not present

## 2022-11-08 DIAGNOSIS — I499 Cardiac arrhythmia, unspecified: Secondary | ICD-10-CM | POA: Diagnosis not present

## 2022-11-08 DIAGNOSIS — I5181 Takotsubo syndrome: Secondary | ICD-10-CM | POA: Diagnosis not present

## 2022-11-13 DIAGNOSIS — I10 Essential (primary) hypertension: Secondary | ICD-10-CM | POA: Diagnosis not present

## 2022-11-13 DIAGNOSIS — K5730 Diverticulosis of large intestine without perforation or abscess without bleeding: Secondary | ICD-10-CM | POA: Diagnosis not present

## 2022-11-13 DIAGNOSIS — I5181 Takotsubo syndrome: Secondary | ICD-10-CM | POA: Diagnosis not present

## 2022-11-13 DIAGNOSIS — M719 Bursopathy, unspecified: Secondary | ICD-10-CM | POA: Diagnosis not present

## 2022-11-13 DIAGNOSIS — M84432D Pathological fracture, left ulna, subsequent encounter for fracture with routine healing: Secondary | ICD-10-CM | POA: Diagnosis not present

## 2022-11-13 DIAGNOSIS — M19071 Primary osteoarthritis, right ankle and foot: Secondary | ICD-10-CM | POA: Diagnosis not present

## 2022-11-13 DIAGNOSIS — R7301 Impaired fasting glucose: Secondary | ICD-10-CM | POA: Diagnosis not present

## 2022-11-13 DIAGNOSIS — I499 Cardiac arrhythmia, unspecified: Secondary | ICD-10-CM | POA: Diagnosis not present

## 2022-11-13 DIAGNOSIS — K219 Gastro-esophageal reflux disease without esophagitis: Secondary | ICD-10-CM | POA: Diagnosis not present

## 2022-11-13 DIAGNOSIS — M5136 Other intervertebral disc degeneration, lumbar region: Secondary | ICD-10-CM | POA: Diagnosis not present

## 2022-11-13 DIAGNOSIS — H8110 Benign paroxysmal vertigo, unspecified ear: Secondary | ICD-10-CM | POA: Diagnosis not present

## 2022-11-13 DIAGNOSIS — E559 Vitamin D deficiency, unspecified: Secondary | ICD-10-CM | POA: Diagnosis not present

## 2022-11-13 DIAGNOSIS — E785 Hyperlipidemia, unspecified: Secondary | ICD-10-CM | POA: Diagnosis not present

## 2022-11-13 DIAGNOSIS — M10072 Idiopathic gout, left ankle and foot: Secondary | ICD-10-CM | POA: Diagnosis not present

## 2022-11-13 DIAGNOSIS — W19XXXD Unspecified fall, subsequent encounter: Secondary | ICD-10-CM | POA: Diagnosis not present

## 2022-11-13 DIAGNOSIS — M8580 Other specified disorders of bone density and structure, unspecified site: Secondary | ICD-10-CM | POA: Diagnosis not present

## 2022-11-15 DIAGNOSIS — I499 Cardiac arrhythmia, unspecified: Secondary | ICD-10-CM | POA: Diagnosis not present

## 2022-11-15 DIAGNOSIS — M719 Bursopathy, unspecified: Secondary | ICD-10-CM | POA: Diagnosis not present

## 2022-11-15 DIAGNOSIS — M84432D Pathological fracture, left ulna, subsequent encounter for fracture with routine healing: Secondary | ICD-10-CM | POA: Diagnosis not present

## 2022-11-15 DIAGNOSIS — I5181 Takotsubo syndrome: Secondary | ICD-10-CM | POA: Diagnosis not present

## 2022-11-15 DIAGNOSIS — M8580 Other specified disorders of bone density and structure, unspecified site: Secondary | ICD-10-CM | POA: Diagnosis not present

## 2022-11-15 DIAGNOSIS — M5136 Other intervertebral disc degeneration, lumbar region: Secondary | ICD-10-CM | POA: Diagnosis not present

## 2022-11-15 DIAGNOSIS — E559 Vitamin D deficiency, unspecified: Secondary | ICD-10-CM | POA: Diagnosis not present

## 2022-11-15 DIAGNOSIS — E785 Hyperlipidemia, unspecified: Secondary | ICD-10-CM | POA: Diagnosis not present

## 2022-11-15 DIAGNOSIS — K5730 Diverticulosis of large intestine without perforation or abscess without bleeding: Secondary | ICD-10-CM | POA: Diagnosis not present

## 2022-11-15 DIAGNOSIS — W19XXXD Unspecified fall, subsequent encounter: Secondary | ICD-10-CM | POA: Diagnosis not present

## 2022-11-15 DIAGNOSIS — M19071 Primary osteoarthritis, right ankle and foot: Secondary | ICD-10-CM | POA: Diagnosis not present

## 2022-11-15 DIAGNOSIS — K219 Gastro-esophageal reflux disease without esophagitis: Secondary | ICD-10-CM | POA: Diagnosis not present

## 2022-11-15 DIAGNOSIS — H8110 Benign paroxysmal vertigo, unspecified ear: Secondary | ICD-10-CM | POA: Diagnosis not present

## 2022-11-15 DIAGNOSIS — M10072 Idiopathic gout, left ankle and foot: Secondary | ICD-10-CM | POA: Diagnosis not present

## 2022-11-15 DIAGNOSIS — I10 Essential (primary) hypertension: Secondary | ICD-10-CM | POA: Diagnosis not present

## 2022-11-15 DIAGNOSIS — R7301 Impaired fasting glucose: Secondary | ICD-10-CM | POA: Diagnosis not present

## 2022-11-19 DIAGNOSIS — H8110 Benign paroxysmal vertigo, unspecified ear: Secondary | ICD-10-CM | POA: Diagnosis not present

## 2022-11-19 DIAGNOSIS — K5730 Diverticulosis of large intestine without perforation or abscess without bleeding: Secondary | ICD-10-CM | POA: Diagnosis not present

## 2022-11-19 DIAGNOSIS — I5181 Takotsubo syndrome: Secondary | ICD-10-CM | POA: Diagnosis not present

## 2022-11-19 DIAGNOSIS — K219 Gastro-esophageal reflux disease without esophagitis: Secondary | ICD-10-CM | POA: Diagnosis not present

## 2022-11-19 DIAGNOSIS — I10 Essential (primary) hypertension: Secondary | ICD-10-CM | POA: Diagnosis not present

## 2022-11-19 DIAGNOSIS — M84432D Pathological fracture, left ulna, subsequent encounter for fracture with routine healing: Secondary | ICD-10-CM | POA: Diagnosis not present

## 2022-11-19 DIAGNOSIS — E785 Hyperlipidemia, unspecified: Secondary | ICD-10-CM | POA: Diagnosis not present

## 2022-11-19 DIAGNOSIS — R7301 Impaired fasting glucose: Secondary | ICD-10-CM | POA: Diagnosis not present

## 2022-11-19 DIAGNOSIS — M5136 Other intervertebral disc degeneration, lumbar region: Secondary | ICD-10-CM | POA: Diagnosis not present

## 2022-11-19 DIAGNOSIS — M19071 Primary osteoarthritis, right ankle and foot: Secondary | ICD-10-CM | POA: Diagnosis not present

## 2022-11-19 DIAGNOSIS — M10072 Idiopathic gout, left ankle and foot: Secondary | ICD-10-CM | POA: Diagnosis not present

## 2022-11-19 DIAGNOSIS — E559 Vitamin D deficiency, unspecified: Secondary | ICD-10-CM | POA: Diagnosis not present

## 2022-11-19 DIAGNOSIS — W19XXXD Unspecified fall, subsequent encounter: Secondary | ICD-10-CM | POA: Diagnosis not present

## 2022-11-19 DIAGNOSIS — I499 Cardiac arrhythmia, unspecified: Secondary | ICD-10-CM | POA: Diagnosis not present

## 2022-11-19 DIAGNOSIS — M8580 Other specified disorders of bone density and structure, unspecified site: Secondary | ICD-10-CM | POA: Diagnosis not present

## 2022-11-19 DIAGNOSIS — M719 Bursopathy, unspecified: Secondary | ICD-10-CM | POA: Diagnosis not present

## 2022-11-20 DIAGNOSIS — M8580 Other specified disorders of bone density and structure, unspecified site: Secondary | ICD-10-CM | POA: Diagnosis not present

## 2022-11-20 DIAGNOSIS — I499 Cardiac arrhythmia, unspecified: Secondary | ICD-10-CM | POA: Diagnosis not present

## 2022-11-20 DIAGNOSIS — M5136 Other intervertebral disc degeneration, lumbar region: Secondary | ICD-10-CM | POA: Diagnosis not present

## 2022-11-20 DIAGNOSIS — E785 Hyperlipidemia, unspecified: Secondary | ICD-10-CM | POA: Diagnosis not present

## 2022-11-20 DIAGNOSIS — R7301 Impaired fasting glucose: Secondary | ICD-10-CM | POA: Diagnosis not present

## 2022-11-20 DIAGNOSIS — M719 Bursopathy, unspecified: Secondary | ICD-10-CM | POA: Diagnosis not present

## 2022-11-20 DIAGNOSIS — K5730 Diverticulosis of large intestine without perforation or abscess without bleeding: Secondary | ICD-10-CM | POA: Diagnosis not present

## 2022-11-20 DIAGNOSIS — M19071 Primary osteoarthritis, right ankle and foot: Secondary | ICD-10-CM | POA: Diagnosis not present

## 2022-11-20 DIAGNOSIS — I5181 Takotsubo syndrome: Secondary | ICD-10-CM | POA: Diagnosis not present

## 2022-11-20 DIAGNOSIS — M10072 Idiopathic gout, left ankle and foot: Secondary | ICD-10-CM | POA: Diagnosis not present

## 2022-11-20 DIAGNOSIS — K219 Gastro-esophageal reflux disease without esophagitis: Secondary | ICD-10-CM | POA: Diagnosis not present

## 2022-11-20 DIAGNOSIS — I10 Essential (primary) hypertension: Secondary | ICD-10-CM | POA: Diagnosis not present

## 2022-11-20 DIAGNOSIS — E559 Vitamin D deficiency, unspecified: Secondary | ICD-10-CM | POA: Diagnosis not present

## 2022-11-20 DIAGNOSIS — W19XXXD Unspecified fall, subsequent encounter: Secondary | ICD-10-CM | POA: Diagnosis not present

## 2022-11-20 DIAGNOSIS — H8110 Benign paroxysmal vertigo, unspecified ear: Secondary | ICD-10-CM | POA: Diagnosis not present

## 2022-11-20 DIAGNOSIS — M84432D Pathological fracture, left ulna, subsequent encounter for fracture with routine healing: Secondary | ICD-10-CM | POA: Diagnosis not present

## 2022-11-22 DIAGNOSIS — E785 Hyperlipidemia, unspecified: Secondary | ICD-10-CM | POA: Diagnosis not present

## 2022-11-22 DIAGNOSIS — H8110 Benign paroxysmal vertigo, unspecified ear: Secondary | ICD-10-CM | POA: Diagnosis not present

## 2022-11-22 DIAGNOSIS — K219 Gastro-esophageal reflux disease without esophagitis: Secondary | ICD-10-CM | POA: Diagnosis not present

## 2022-11-22 DIAGNOSIS — M84432D Pathological fracture, left ulna, subsequent encounter for fracture with routine healing: Secondary | ICD-10-CM | POA: Diagnosis not present

## 2022-11-22 DIAGNOSIS — M19071 Primary osteoarthritis, right ankle and foot: Secondary | ICD-10-CM | POA: Diagnosis not present

## 2022-11-22 DIAGNOSIS — F329 Major depressive disorder, single episode, unspecified: Secondary | ICD-10-CM | POA: Diagnosis not present

## 2022-11-22 DIAGNOSIS — M8580 Other specified disorders of bone density and structure, unspecified site: Secondary | ICD-10-CM | POA: Diagnosis not present

## 2022-11-22 DIAGNOSIS — W19XXXD Unspecified fall, subsequent encounter: Secondary | ICD-10-CM | POA: Diagnosis not present

## 2022-11-22 DIAGNOSIS — M719 Bursopathy, unspecified: Secondary | ICD-10-CM | POA: Diagnosis not present

## 2022-11-22 DIAGNOSIS — E559 Vitamin D deficiency, unspecified: Secondary | ICD-10-CM | POA: Diagnosis not present

## 2022-11-22 DIAGNOSIS — K5730 Diverticulosis of large intestine without perforation or abscess without bleeding: Secondary | ICD-10-CM | POA: Diagnosis not present

## 2022-11-22 DIAGNOSIS — K259 Gastric ulcer, unspecified as acute or chronic, without hemorrhage or perforation: Secondary | ICD-10-CM | POA: Diagnosis not present

## 2022-11-22 DIAGNOSIS — K8020 Calculus of gallbladder without cholecystitis without obstruction: Secondary | ICD-10-CM | POA: Diagnosis not present

## 2022-11-22 DIAGNOSIS — M5136 Other intervertebral disc degeneration, lumbar region: Secondary | ICD-10-CM | POA: Diagnosis not present

## 2022-11-22 DIAGNOSIS — M10072 Idiopathic gout, left ankle and foot: Secondary | ICD-10-CM | POA: Diagnosis not present

## 2022-11-22 DIAGNOSIS — E663 Overweight: Secondary | ICD-10-CM | POA: Diagnosis not present

## 2022-11-22 DIAGNOSIS — I499 Cardiac arrhythmia, unspecified: Secondary | ICD-10-CM | POA: Diagnosis not present

## 2022-11-22 DIAGNOSIS — N3281 Overactive bladder: Secondary | ICD-10-CM | POA: Diagnosis not present

## 2022-11-22 DIAGNOSIS — R7301 Impaired fasting glucose: Secondary | ICD-10-CM | POA: Diagnosis not present

## 2022-11-22 DIAGNOSIS — R413 Other amnesia: Secondary | ICD-10-CM | POA: Diagnosis not present

## 2022-11-22 DIAGNOSIS — F410 Panic disorder [episodic paroxysmal anxiety] without agoraphobia: Secondary | ICD-10-CM | POA: Diagnosis not present

## 2022-11-22 DIAGNOSIS — I5181 Takotsubo syndrome: Secondary | ICD-10-CM | POA: Diagnosis not present

## 2022-11-22 DIAGNOSIS — I10 Essential (primary) hypertension: Secondary | ICD-10-CM | POA: Diagnosis not present

## 2022-11-22 DIAGNOSIS — Z9181 History of falling: Secondary | ICD-10-CM | POA: Diagnosis not present

## 2022-11-22 DIAGNOSIS — D126 Benign neoplasm of colon, unspecified: Secondary | ICD-10-CM | POA: Diagnosis not present

## 2022-11-28 DIAGNOSIS — S52592D Other fractures of lower end of left radius, subsequent encounter for closed fracture with routine healing: Secondary | ICD-10-CM | POA: Diagnosis not present

## 2022-11-29 DIAGNOSIS — R7301 Impaired fasting glucose: Secondary | ICD-10-CM | POA: Diagnosis not present

## 2022-11-29 DIAGNOSIS — K219 Gastro-esophageal reflux disease without esophagitis: Secondary | ICD-10-CM | POA: Diagnosis not present

## 2022-11-29 DIAGNOSIS — W19XXXD Unspecified fall, subsequent encounter: Secondary | ICD-10-CM | POA: Diagnosis not present

## 2022-11-29 DIAGNOSIS — K5730 Diverticulosis of large intestine without perforation or abscess without bleeding: Secondary | ICD-10-CM | POA: Diagnosis not present

## 2022-11-29 DIAGNOSIS — M84432D Pathological fracture, left ulna, subsequent encounter for fracture with routine healing: Secondary | ICD-10-CM | POA: Diagnosis not present

## 2022-11-29 DIAGNOSIS — E559 Vitamin D deficiency, unspecified: Secondary | ICD-10-CM | POA: Diagnosis not present

## 2022-11-29 DIAGNOSIS — H8110 Benign paroxysmal vertigo, unspecified ear: Secondary | ICD-10-CM | POA: Diagnosis not present

## 2022-11-29 DIAGNOSIS — M19071 Primary osteoarthritis, right ankle and foot: Secondary | ICD-10-CM | POA: Diagnosis not present

## 2022-11-29 DIAGNOSIS — E785 Hyperlipidemia, unspecified: Secondary | ICD-10-CM | POA: Diagnosis not present

## 2022-11-29 DIAGNOSIS — M10072 Idiopathic gout, left ankle and foot: Secondary | ICD-10-CM | POA: Diagnosis not present

## 2022-11-29 DIAGNOSIS — I5181 Takotsubo syndrome: Secondary | ICD-10-CM | POA: Diagnosis not present

## 2022-11-29 DIAGNOSIS — M5136 Other intervertebral disc degeneration, lumbar region: Secondary | ICD-10-CM | POA: Diagnosis not present

## 2022-11-29 DIAGNOSIS — M719 Bursopathy, unspecified: Secondary | ICD-10-CM | POA: Diagnosis not present

## 2022-11-29 DIAGNOSIS — I10 Essential (primary) hypertension: Secondary | ICD-10-CM | POA: Diagnosis not present

## 2022-11-29 DIAGNOSIS — I499 Cardiac arrhythmia, unspecified: Secondary | ICD-10-CM | POA: Diagnosis not present

## 2022-11-29 DIAGNOSIS — M8580 Other specified disorders of bone density and structure, unspecified site: Secondary | ICD-10-CM | POA: Diagnosis not present

## 2023-04-12 NOTE — Progress Notes (Unsigned)
Cardiology Office Note:  .   Date:  04/14/2023  ID:  Colleen Glenn, DOB [birth year], MRN [id number] PCP: Rodrigo Ran, MD  [facility] Providers Cardiologist:  Tonny Bollman, MD    History of Present Illness: .   Colleen Glenn is a pleasant 82 y.o. female with PMH of Takotsubo syndrome with normalization of LVEF on echo 01/2021, nonobstructive CAD, and HTN.      Last cardiology clinic visit was 04/11/22 with Dr. Excell Seltzer at which time she was doing well from a cardiac perspective.  She reported occasional chest discomfort that does not appear to be related to stress or to physical exertion.  She was advised to continue beta-blocker and low-dose aspirin and return in 1 year for follow-up.     ROS: Having pressure with urination and chronic back pain. Has occasional nonspecific chest pains.  Cannot tolerate lying flat due to dizziness and cannot bend forward without getting nauseated and vomiting. Daughter reports she gets very anxious prior to appointments. Patient feels loss of independence since her family has advised her not to drive. She denies shortness of breath, lower extremity edema, fatigue, palpitations, melena, hematuria, hemoptysis, diaphoresis, weakness, presyncope, syncope, orthopnea, and PND.  Studies Reviewed: Marland Kitchen   EKG Interpretation  Date/Time:  Monday April 14 2023 16:08:55 EDT Ventricular Rate:  73 PR Interval:  150 QRS Duration: 78 QT Interval:  390 QTC Calculation: 429 R Axis:   20 Text Interpretation: Normal sinus rhythm Normal ECG When compared with ECG of 30-Sep-2020 13:39, Premature atrial complexes are no longer Present ST no longer elevated in Anterolateral leads T wave inversion no longer evident in Inferior leads Nonspecific T wave abnormality, improved in Lateral leads No acute changes Confirmed by Eligha Bridegroom [phone]) on 04/14/2023 5:46:58 PM     Risk Assessment/Calculations:             Physical Exam:   VS:  BP 130/70   Pulse 73    Ht 5\' 4"  (1.626 m)   Wt 156 lb 3.2 oz (70.9 kg)   SpO2 95%   BMI 26.81 kg/m    Wt Readings from Last 3 Encounters:  04/14/23 156 lb 3.2 oz (70.9 kg)  04/11/22 161 lb (73 kg)  08/07/21 151 lb (68.5 kg)    GEN: Well nourished, well developed in no acute distress NECK: No JVD; No carotid bruits CARDIAC: RRR, no murmurs, rubs, gallops RESPIRATORY:  Clear to auscultation without rales, wheezing or rhonchi  ABDOMEN: Soft, non-tender, non-distended EXTREMITIES:  No edema; No deformity   ASSESSMENT AND PLAN: .    CAD without angina: Nonobstructive CAD with moderate mid LAD stenosis, mild proximal RCA stenosis, and no evidence of high-grade obstruction on cath 09/2020. She has occasional chest pains that do not sound consistent with angina. She denies dyspnea, or other symptoms concerning for angina. No indication for further ischemic evaluation at this time. Was advised she could d/c aspirin some time ago by Dr. Excell Seltzer per pt's daughter. Continue GDMT including carvedilol, ezetimibe, rosuvastatin.   Takotsubo cardiomyopathy: Severe segmental LV dysfunction with classic pattern of acute Takotsubo syndrome (LV apical ballooning) on cardiac cath 09/2020. Echo 09/29/20 with LVEF 35-40%, G1DD, wall motion abnormalities. Improvement on echo 01/23/21 with LVEF 70-75%. She has been doing well on medical therapy. No dyspnea, orthopnea, weight gain, edema, or PND. Daughter is concerned that she will have return of CM due to anxiety. Advised symptoms to monitor. She is currently asymptomatic.   Hypertension: BP is well controlled.  Hyperlipidemia LDL goal < 70: LDL 52 on 09/16/2022.Emphasized importance of low LDL for CV risk reduction. Continue ezetimibe and rosuvastatin.   Vertigo: Has episodes of dizziness with n/v. Cannot tolerate lying flat. Encouraged her to try meclizine.   Dispo: 1 year with Dr. Excell Seltzer  Signed, Paiton Fosco, Zachary George, NP

## 2023-04-14 ENCOUNTER — Ambulatory Visit: Payer: Medicare Other | Attending: Nurse Practitioner | Admitting: Nurse Practitioner

## 2023-04-14 ENCOUNTER — Encounter: Payer: Self-pay | Admitting: Nurse Practitioner

## 2023-04-14 VITALS — BP 130/70 | HR 73 | Ht 64.0 in | Wt 156.2 lb

## 2023-04-14 DIAGNOSIS — E785 Hyperlipidemia, unspecified: Secondary | ICD-10-CM

## 2023-04-14 DIAGNOSIS — I5181 Takotsubo syndrome: Secondary | ICD-10-CM | POA: Diagnosis not present

## 2023-04-14 DIAGNOSIS — I10 Essential (primary) hypertension: Secondary | ICD-10-CM

## 2023-04-14 DIAGNOSIS — R42 Dizziness and giddiness: Secondary | ICD-10-CM

## 2023-04-14 DIAGNOSIS — I2510 Atherosclerotic heart disease of native coronary artery without angina pectoris: Secondary | ICD-10-CM

## 2023-04-14 NOTE — Patient Instructions (Signed)
Medication Instructions:   Please try Meclizine one (1) tablet by mouth ( 25 mg) three times daily as needed for vertigo.  *If you need a refill on your cardiac medications before your next appointment, please call your pharmacy*   Lab Work:  None ordered.  If you have labs (blood work) drawn today and your tests are completely normal, you will receive your results only by: MyChart Message (if you have MyChart) OR A paper copy in the mail If you have any lab test that is abnormal or we need to change your treatment, we will call you to review the results.   Testing/Procedures:  None ordered.   Follow-Up: At [facility], you and your health needs are our priority.  As part of our continuing mission to provide you with exceptional heart care, we have created designated Provider Care Teams.  These Care Teams include your primary Cardiologist (physician) and Advanced Practice Providers (APPs -  Physician Assistants and Nurse Practitioners) who all work together to provide you with the care you need, when you need it.  We recommend signing up for the patient portal called "MyChart".  Sign up information is provided on this After Visit Summary.  MyChart is used to connect with patients for Virtual Visits (Telemedicine).  Patients are able to view lab/test results, encounter notes, upcoming appointments, etc.  Non-urgent messages can be sent to your provider as well.   To learn more about what you can do with MyChart, go to [email or web address].    Your next appointment:   1 year(s)  Provider:   Tonny Bollman, MD     Other Instructions  Your physician wants you to follow-up in: 1 year follow with Dr. Excell Seltzer.  You will receive a reminder letter in the mail two months in advance. If you don't receive a letter, please call our office to schedule the follow-up appointment.

## 2023-05-06 DIAGNOSIS — R7301 Impaired fasting glucose: Secondary | ICD-10-CM | POA: Diagnosis not present

## 2023-05-06 DIAGNOSIS — I10 Essential (primary) hypertension: Secondary | ICD-10-CM | POA: Diagnosis not present

## 2023-05-06 DIAGNOSIS — F0390 Unspecified dementia without behavioral disturbance: Secondary | ICD-10-CM | POA: Diagnosis not present

## 2023-05-12 ENCOUNTER — Other Ambulatory Visit: Payer: Self-pay | Admitting: Internal Medicine

## 2023-05-12 DIAGNOSIS — F410 Panic disorder [episodic paroxysmal anxiety] without agoraphobia: Secondary | ICD-10-CM | POA: Diagnosis not present

## 2023-05-12 DIAGNOSIS — M5136 Other intervertebral disc degeneration, lumbar region: Secondary | ICD-10-CM

## 2023-05-12 DIAGNOSIS — E538 Deficiency of other specified B group vitamins: Secondary | ICD-10-CM | POA: Diagnosis not present

## 2023-05-12 DIAGNOSIS — Z87891 Personal history of nicotine dependence: Secondary | ICD-10-CM | POA: Diagnosis not present

## 2023-05-12 DIAGNOSIS — K8020 Calculus of gallbladder without cholecystitis without obstruction: Secondary | ICD-10-CM | POA: Diagnosis not present

## 2023-05-12 DIAGNOSIS — M19072 Primary osteoarthritis, left ankle and foot: Secondary | ICD-10-CM | POA: Diagnosis not present

## 2023-05-12 DIAGNOSIS — E785 Hyperlipidemia, unspecified: Secondary | ICD-10-CM | POA: Diagnosis not present

## 2023-05-12 DIAGNOSIS — Z9181 History of falling: Secondary | ICD-10-CM | POA: Diagnosis not present

## 2023-05-12 DIAGNOSIS — F0284 Dementia in other diseases classified elsewhere, unspecified severity, with anxiety: Secondary | ICD-10-CM | POA: Diagnosis not present

## 2023-05-12 DIAGNOSIS — F329 Major depressive disorder, single episode, unspecified: Secondary | ICD-10-CM | POA: Diagnosis not present

## 2023-05-12 DIAGNOSIS — F0280 Dementia in other diseases classified elsewhere without behavioral disturbance: Secondary | ICD-10-CM | POA: Diagnosis not present

## 2023-05-12 DIAGNOSIS — N318 Other neuromuscular dysfunction of bladder: Secondary | ICD-10-CM | POA: Diagnosis not present

## 2023-05-12 DIAGNOSIS — K5730 Diverticulosis of large intestine without perforation or abscess without bleeding: Secondary | ICD-10-CM | POA: Diagnosis not present

## 2023-05-12 DIAGNOSIS — Z6824 Body mass index (BMI) 24.0-24.9, adult: Secondary | ICD-10-CM | POA: Diagnosis not present

## 2023-05-12 DIAGNOSIS — M8580 Other specified disorders of bone density and structure, unspecified site: Secondary | ICD-10-CM | POA: Diagnosis not present

## 2023-05-12 DIAGNOSIS — K219 Gastro-esophageal reflux disease without esophagitis: Secondary | ICD-10-CM | POA: Diagnosis not present

## 2023-05-12 DIAGNOSIS — E663 Overweight: Secondary | ICD-10-CM | POA: Diagnosis not present

## 2023-05-12 DIAGNOSIS — I5181 Takotsubo syndrome: Secondary | ICD-10-CM | POA: Diagnosis not present

## 2023-05-12 DIAGNOSIS — I10 Essential (primary) hypertension: Secondary | ICD-10-CM | POA: Diagnosis not present

## 2023-05-12 DIAGNOSIS — D126 Benign neoplasm of colon, unspecified: Secondary | ICD-10-CM | POA: Diagnosis not present

## 2023-05-12 DIAGNOSIS — K649 Unspecified hemorrhoids: Secondary | ICD-10-CM | POA: Diagnosis not present

## 2023-05-12 DIAGNOSIS — M19071 Primary osteoarthritis, right ankle and foot: Secondary | ICD-10-CM | POA: Diagnosis not present

## 2023-05-12 DIAGNOSIS — I499 Cardiac arrhythmia, unspecified: Secondary | ICD-10-CM | POA: Diagnosis not present

## 2023-05-16 DIAGNOSIS — Z6824 Body mass index (BMI) 24.0-24.9, adult: Secondary | ICD-10-CM | POA: Diagnosis not present

## 2023-05-16 DIAGNOSIS — I10 Essential (primary) hypertension: Secondary | ICD-10-CM | POA: Diagnosis not present

## 2023-05-16 DIAGNOSIS — F0284 Dementia in other diseases classified elsewhere, unspecified severity, with anxiety: Secondary | ICD-10-CM | POA: Diagnosis not present

## 2023-05-16 DIAGNOSIS — E785 Hyperlipidemia, unspecified: Secondary | ICD-10-CM | POA: Diagnosis not present

## 2023-05-16 DIAGNOSIS — M19071 Primary osteoarthritis, right ankle and foot: Secondary | ICD-10-CM | POA: Diagnosis not present

## 2023-05-16 DIAGNOSIS — I499 Cardiac arrhythmia, unspecified: Secondary | ICD-10-CM | POA: Diagnosis not present

## 2023-05-16 DIAGNOSIS — F410 Panic disorder [episodic paroxysmal anxiety] without agoraphobia: Secondary | ICD-10-CM | POA: Diagnosis not present

## 2023-05-16 DIAGNOSIS — I5181 Takotsubo syndrome: Secondary | ICD-10-CM | POA: Diagnosis not present

## 2023-05-16 DIAGNOSIS — F0280 Dementia in other diseases classified elsewhere without behavioral disturbance: Secondary | ICD-10-CM | POA: Diagnosis not present

## 2023-05-16 DIAGNOSIS — E663 Overweight: Secondary | ICD-10-CM | POA: Diagnosis not present

## 2023-05-16 DIAGNOSIS — M19072 Primary osteoarthritis, left ankle and foot: Secondary | ICD-10-CM | POA: Diagnosis not present

## 2023-05-16 DIAGNOSIS — E538 Deficiency of other specified B group vitamins: Secondary | ICD-10-CM | POA: Diagnosis not present

## 2023-05-16 DIAGNOSIS — M8580 Other specified disorders of bone density and structure, unspecified site: Secondary | ICD-10-CM | POA: Diagnosis not present

## 2023-05-16 DIAGNOSIS — K219 Gastro-esophageal reflux disease without esophagitis: Secondary | ICD-10-CM | POA: Diagnosis not present

## 2023-05-16 DIAGNOSIS — K649 Unspecified hemorrhoids: Secondary | ICD-10-CM | POA: Diagnosis not present

## 2023-05-16 DIAGNOSIS — F329 Major depressive disorder, single episode, unspecified: Secondary | ICD-10-CM | POA: Diagnosis not present

## 2023-05-20 DIAGNOSIS — F0284 Dementia in other diseases classified elsewhere, unspecified severity, with anxiety: Secondary | ICD-10-CM | POA: Diagnosis not present

## 2023-05-20 DIAGNOSIS — I5181 Takotsubo syndrome: Secondary | ICD-10-CM | POA: Diagnosis not present

## 2023-05-20 DIAGNOSIS — E785 Hyperlipidemia, unspecified: Secondary | ICD-10-CM | POA: Diagnosis not present

## 2023-05-20 DIAGNOSIS — M8580 Other specified disorders of bone density and structure, unspecified site: Secondary | ICD-10-CM | POA: Diagnosis not present

## 2023-05-20 DIAGNOSIS — F410 Panic disorder [episodic paroxysmal anxiety] without agoraphobia: Secondary | ICD-10-CM | POA: Diagnosis not present

## 2023-05-20 DIAGNOSIS — M19072 Primary osteoarthritis, left ankle and foot: Secondary | ICD-10-CM | POA: Diagnosis not present

## 2023-05-20 DIAGNOSIS — E538 Deficiency of other specified B group vitamins: Secondary | ICD-10-CM | POA: Diagnosis not present

## 2023-05-20 DIAGNOSIS — I499 Cardiac arrhythmia, unspecified: Secondary | ICD-10-CM | POA: Diagnosis not present

## 2023-05-20 DIAGNOSIS — K649 Unspecified hemorrhoids: Secondary | ICD-10-CM | POA: Diagnosis not present

## 2023-05-20 DIAGNOSIS — F329 Major depressive disorder, single episode, unspecified: Secondary | ICD-10-CM | POA: Diagnosis not present

## 2023-05-20 DIAGNOSIS — E663 Overweight: Secondary | ICD-10-CM | POA: Diagnosis not present

## 2023-05-20 DIAGNOSIS — Z6824 Body mass index (BMI) 24.0-24.9, adult: Secondary | ICD-10-CM | POA: Diagnosis not present

## 2023-05-20 DIAGNOSIS — I10 Essential (primary) hypertension: Secondary | ICD-10-CM | POA: Diagnosis not present

## 2023-05-20 DIAGNOSIS — M19071 Primary osteoarthritis, right ankle and foot: Secondary | ICD-10-CM | POA: Diagnosis not present

## 2023-05-20 DIAGNOSIS — F0280 Dementia in other diseases classified elsewhere without behavioral disturbance: Secondary | ICD-10-CM | POA: Diagnosis not present

## 2023-05-20 DIAGNOSIS — K219 Gastro-esophageal reflux disease without esophagitis: Secondary | ICD-10-CM | POA: Diagnosis not present

## 2023-05-22 DIAGNOSIS — E538 Deficiency of other specified B group vitamins: Secondary | ICD-10-CM | POA: Diagnosis not present

## 2023-05-22 DIAGNOSIS — M19071 Primary osteoarthritis, right ankle and foot: Secondary | ICD-10-CM | POA: Diagnosis not present

## 2023-05-22 DIAGNOSIS — I5181 Takotsubo syndrome: Secondary | ICD-10-CM | POA: Diagnosis not present

## 2023-05-22 DIAGNOSIS — Z6824 Body mass index (BMI) 24.0-24.9, adult: Secondary | ICD-10-CM | POA: Diagnosis not present

## 2023-05-22 DIAGNOSIS — E785 Hyperlipidemia, unspecified: Secondary | ICD-10-CM | POA: Diagnosis not present

## 2023-05-22 DIAGNOSIS — M19072 Primary osteoarthritis, left ankle and foot: Secondary | ICD-10-CM | POA: Diagnosis not present

## 2023-05-22 DIAGNOSIS — E663 Overweight: Secondary | ICD-10-CM | POA: Diagnosis not present

## 2023-05-22 DIAGNOSIS — I499 Cardiac arrhythmia, unspecified: Secondary | ICD-10-CM | POA: Diagnosis not present

## 2023-05-22 DIAGNOSIS — F0280 Dementia in other diseases classified elsewhere without behavioral disturbance: Secondary | ICD-10-CM | POA: Diagnosis not present

## 2023-05-22 DIAGNOSIS — K219 Gastro-esophageal reflux disease without esophagitis: Secondary | ICD-10-CM | POA: Diagnosis not present

## 2023-05-22 DIAGNOSIS — M8580 Other specified disorders of bone density and structure, unspecified site: Secondary | ICD-10-CM | POA: Diagnosis not present

## 2023-05-22 DIAGNOSIS — F410 Panic disorder [episodic paroxysmal anxiety] without agoraphobia: Secondary | ICD-10-CM | POA: Diagnosis not present

## 2023-05-22 DIAGNOSIS — F329 Major depressive disorder, single episode, unspecified: Secondary | ICD-10-CM | POA: Diagnosis not present

## 2023-05-22 DIAGNOSIS — I10 Essential (primary) hypertension: Secondary | ICD-10-CM | POA: Diagnosis not present

## 2023-05-22 DIAGNOSIS — F0284 Dementia in other diseases classified elsewhere, unspecified severity, with anxiety: Secondary | ICD-10-CM | POA: Diagnosis not present

## 2023-05-22 DIAGNOSIS — K649 Unspecified hemorrhoids: Secondary | ICD-10-CM | POA: Diagnosis not present

## 2023-06-05 DIAGNOSIS — E663 Overweight: Secondary | ICD-10-CM | POA: Diagnosis not present

## 2023-06-05 DIAGNOSIS — I10 Essential (primary) hypertension: Secondary | ICD-10-CM | POA: Diagnosis not present

## 2023-06-05 DIAGNOSIS — K649 Unspecified hemorrhoids: Secondary | ICD-10-CM | POA: Diagnosis not present

## 2023-06-05 DIAGNOSIS — F0280 Dementia in other diseases classified elsewhere without behavioral disturbance: Secondary | ICD-10-CM | POA: Diagnosis not present

## 2023-06-05 DIAGNOSIS — F329 Major depressive disorder, single episode, unspecified: Secondary | ICD-10-CM | POA: Diagnosis not present

## 2023-06-05 DIAGNOSIS — K219 Gastro-esophageal reflux disease without esophagitis: Secondary | ICD-10-CM | POA: Diagnosis not present

## 2023-06-05 DIAGNOSIS — Z6824 Body mass index (BMI) 24.0-24.9, adult: Secondary | ICD-10-CM | POA: Diagnosis not present

## 2023-06-05 DIAGNOSIS — F0284 Dementia in other diseases classified elsewhere, unspecified severity, with anxiety: Secondary | ICD-10-CM | POA: Diagnosis not present

## 2023-06-05 DIAGNOSIS — M19072 Primary osteoarthritis, left ankle and foot: Secondary | ICD-10-CM | POA: Diagnosis not present

## 2023-06-05 DIAGNOSIS — I499 Cardiac arrhythmia, unspecified: Secondary | ICD-10-CM | POA: Diagnosis not present

## 2023-06-05 DIAGNOSIS — I5181 Takotsubo syndrome: Secondary | ICD-10-CM | POA: Diagnosis not present

## 2023-06-05 DIAGNOSIS — M8580 Other specified disorders of bone density and structure, unspecified site: Secondary | ICD-10-CM | POA: Diagnosis not present

## 2023-06-05 DIAGNOSIS — E785 Hyperlipidemia, unspecified: Secondary | ICD-10-CM | POA: Diagnosis not present

## 2023-06-05 DIAGNOSIS — M19071 Primary osteoarthritis, right ankle and foot: Secondary | ICD-10-CM | POA: Diagnosis not present

## 2023-06-05 DIAGNOSIS — F410 Panic disorder [episodic paroxysmal anxiety] without agoraphobia: Secondary | ICD-10-CM | POA: Diagnosis not present

## 2023-06-05 DIAGNOSIS — E538 Deficiency of other specified B group vitamins: Secondary | ICD-10-CM | POA: Diagnosis not present

## 2023-06-07 ENCOUNTER — Ambulatory Visit
Admission: RE | Admit: 2023-06-07 | Discharge: 2023-06-07 | Disposition: A | Discharge status: Home / Self Care | Payer: Medicare Other | Source: Ambulatory Visit | Attending: Internal Medicine | Admitting: Internal Medicine

## 2023-06-07 DIAGNOSIS — M5136 Other intervertebral disc degeneration, lumbar region: Secondary | ICD-10-CM

## 2023-06-07 DIAGNOSIS — M5416 Radiculopathy, lumbar region: Secondary | ICD-10-CM | POA: Diagnosis not present

## 2023-06-11 DIAGNOSIS — M8580 Other specified disorders of bone density and structure, unspecified site: Secondary | ICD-10-CM | POA: Diagnosis not present

## 2023-06-11 DIAGNOSIS — K5730 Diverticulosis of large intestine without perforation or abscess without bleeding: Secondary | ICD-10-CM | POA: Diagnosis not present

## 2023-06-11 DIAGNOSIS — K219 Gastro-esophageal reflux disease without esophagitis: Secondary | ICD-10-CM | POA: Diagnosis not present

## 2023-06-11 DIAGNOSIS — F329 Major depressive disorder, single episode, unspecified: Secondary | ICD-10-CM | POA: Diagnosis not present

## 2023-06-11 DIAGNOSIS — Z6824 Body mass index (BMI) 24.0-24.9, adult: Secondary | ICD-10-CM | POA: Diagnosis not present

## 2023-06-11 DIAGNOSIS — E538 Deficiency of other specified B group vitamins: Secondary | ICD-10-CM | POA: Diagnosis not present

## 2023-06-11 DIAGNOSIS — I499 Cardiac arrhythmia, unspecified: Secondary | ICD-10-CM | POA: Diagnosis not present

## 2023-06-11 DIAGNOSIS — E785 Hyperlipidemia, unspecified: Secondary | ICD-10-CM | POA: Diagnosis not present

## 2023-06-11 DIAGNOSIS — K649 Unspecified hemorrhoids: Secondary | ICD-10-CM | POA: Diagnosis not present

## 2023-06-11 DIAGNOSIS — F0284 Dementia in other diseases classified elsewhere, unspecified severity, with anxiety: Secondary | ICD-10-CM | POA: Diagnosis not present

## 2023-06-11 DIAGNOSIS — Z87891 Personal history of nicotine dependence: Secondary | ICD-10-CM | POA: Diagnosis not present

## 2023-06-11 DIAGNOSIS — D126 Benign neoplasm of colon, unspecified: Secondary | ICD-10-CM | POA: Diagnosis not present

## 2023-06-11 DIAGNOSIS — M19071 Primary osteoarthritis, right ankle and foot: Secondary | ICD-10-CM | POA: Diagnosis not present

## 2023-06-11 DIAGNOSIS — I10 Essential (primary) hypertension: Secondary | ICD-10-CM | POA: Diagnosis not present

## 2023-06-11 DIAGNOSIS — N318 Other neuromuscular dysfunction of bladder: Secondary | ICD-10-CM | POA: Diagnosis not present

## 2023-06-11 DIAGNOSIS — F0280 Dementia in other diseases classified elsewhere without behavioral disturbance: Secondary | ICD-10-CM | POA: Diagnosis not present

## 2023-06-11 DIAGNOSIS — F410 Panic disorder [episodic paroxysmal anxiety] without agoraphobia: Secondary | ICD-10-CM | POA: Diagnosis not present

## 2023-06-11 DIAGNOSIS — I5181 Takotsubo syndrome: Secondary | ICD-10-CM | POA: Diagnosis not present

## 2023-06-11 DIAGNOSIS — K8020 Calculus of gallbladder without cholecystitis without obstruction: Secondary | ICD-10-CM | POA: Diagnosis not present

## 2023-06-11 DIAGNOSIS — M5136 Other intervertebral disc degeneration, lumbar region: Secondary | ICD-10-CM | POA: Diagnosis not present

## 2023-06-11 DIAGNOSIS — E663 Overweight: Secondary | ICD-10-CM | POA: Diagnosis not present

## 2023-06-11 DIAGNOSIS — M19072 Primary osteoarthritis, left ankle and foot: Secondary | ICD-10-CM | POA: Diagnosis not present

## 2023-06-11 DIAGNOSIS — Z9181 History of falling: Secondary | ICD-10-CM | POA: Diagnosis not present

## 2023-06-14 DIAGNOSIS — F0280 Dementia in other diseases classified elsewhere without behavioral disturbance: Secondary | ICD-10-CM | POA: Diagnosis not present

## 2023-06-14 DIAGNOSIS — M19072 Primary osteoarthritis, left ankle and foot: Secondary | ICD-10-CM | POA: Diagnosis not present

## 2023-06-14 DIAGNOSIS — Z6824 Body mass index (BMI) 24.0-24.9, adult: Secondary | ICD-10-CM | POA: Diagnosis not present

## 2023-06-14 DIAGNOSIS — I5181 Takotsubo syndrome: Secondary | ICD-10-CM | POA: Diagnosis not present

## 2023-06-14 DIAGNOSIS — M8580 Other specified disorders of bone density and structure, unspecified site: Secondary | ICD-10-CM | POA: Diagnosis not present

## 2023-06-14 DIAGNOSIS — M19071 Primary osteoarthritis, right ankle and foot: Secondary | ICD-10-CM | POA: Diagnosis not present

## 2023-06-14 DIAGNOSIS — I499 Cardiac arrhythmia, unspecified: Secondary | ICD-10-CM | POA: Diagnosis not present

## 2023-06-14 DIAGNOSIS — F410 Panic disorder [episodic paroxysmal anxiety] without agoraphobia: Secondary | ICD-10-CM | POA: Diagnosis not present

## 2023-06-14 DIAGNOSIS — F0284 Dementia in other diseases classified elsewhere, unspecified severity, with anxiety: Secondary | ICD-10-CM | POA: Diagnosis not present

## 2023-06-14 DIAGNOSIS — F329 Major depressive disorder, single episode, unspecified: Secondary | ICD-10-CM | POA: Diagnosis not present

## 2023-06-14 DIAGNOSIS — E538 Deficiency of other specified B group vitamins: Secondary | ICD-10-CM | POA: Diagnosis not present

## 2023-06-14 DIAGNOSIS — I10 Essential (primary) hypertension: Secondary | ICD-10-CM | POA: Diagnosis not present

## 2023-06-14 DIAGNOSIS — K219 Gastro-esophageal reflux disease without esophagitis: Secondary | ICD-10-CM | POA: Diagnosis not present

## 2023-06-14 DIAGNOSIS — K649 Unspecified hemorrhoids: Secondary | ICD-10-CM | POA: Diagnosis not present

## 2023-06-14 DIAGNOSIS — E785 Hyperlipidemia, unspecified: Secondary | ICD-10-CM | POA: Diagnosis not present

## 2023-06-14 DIAGNOSIS — E663 Overweight: Secondary | ICD-10-CM | POA: Diagnosis not present

## 2023-06-17 DIAGNOSIS — E785 Hyperlipidemia, unspecified: Secondary | ICD-10-CM | POA: Diagnosis not present

## 2023-06-17 DIAGNOSIS — Z6824 Body mass index (BMI) 24.0-24.9, adult: Secondary | ICD-10-CM | POA: Diagnosis not present

## 2023-06-17 DIAGNOSIS — I499 Cardiac arrhythmia, unspecified: Secondary | ICD-10-CM | POA: Diagnosis not present

## 2023-06-17 DIAGNOSIS — M19071 Primary osteoarthritis, right ankle and foot: Secondary | ICD-10-CM | POA: Diagnosis not present

## 2023-06-17 DIAGNOSIS — F0280 Dementia in other diseases classified elsewhere without behavioral disturbance: Secondary | ICD-10-CM | POA: Diagnosis not present

## 2023-06-17 DIAGNOSIS — K649 Unspecified hemorrhoids: Secondary | ICD-10-CM | POA: Diagnosis not present

## 2023-06-17 DIAGNOSIS — E538 Deficiency of other specified B group vitamins: Secondary | ICD-10-CM | POA: Diagnosis not present

## 2023-06-17 DIAGNOSIS — M8580 Other specified disorders of bone density and structure, unspecified site: Secondary | ICD-10-CM | POA: Diagnosis not present

## 2023-06-17 DIAGNOSIS — K219 Gastro-esophageal reflux disease without esophagitis: Secondary | ICD-10-CM | POA: Diagnosis not present

## 2023-06-17 DIAGNOSIS — F329 Major depressive disorder, single episode, unspecified: Secondary | ICD-10-CM | POA: Diagnosis not present

## 2023-06-17 DIAGNOSIS — I5181 Takotsubo syndrome: Secondary | ICD-10-CM | POA: Diagnosis not present

## 2023-06-17 DIAGNOSIS — M19072 Primary osteoarthritis, left ankle and foot: Secondary | ICD-10-CM | POA: Diagnosis not present

## 2023-06-17 DIAGNOSIS — E663 Overweight: Secondary | ICD-10-CM | POA: Diagnosis not present

## 2023-06-17 DIAGNOSIS — I10 Essential (primary) hypertension: Secondary | ICD-10-CM | POA: Diagnosis not present

## 2023-06-17 DIAGNOSIS — F0284 Dementia in other diseases classified elsewhere, unspecified severity, with anxiety: Secondary | ICD-10-CM | POA: Diagnosis not present

## 2023-06-17 DIAGNOSIS — F410 Panic disorder [episodic paroxysmal anxiety] without agoraphobia: Secondary | ICD-10-CM | POA: Diagnosis not present

## 2023-06-19 DIAGNOSIS — E785 Hyperlipidemia, unspecified: Secondary | ICD-10-CM | POA: Diagnosis not present

## 2023-06-19 DIAGNOSIS — M8580 Other specified disorders of bone density and structure, unspecified site: Secondary | ICD-10-CM | POA: Diagnosis not present

## 2023-06-19 DIAGNOSIS — F329 Major depressive disorder, single episode, unspecified: Secondary | ICD-10-CM | POA: Diagnosis not present

## 2023-06-19 DIAGNOSIS — I10 Essential (primary) hypertension: Secondary | ICD-10-CM | POA: Diagnosis not present

## 2023-06-19 DIAGNOSIS — F0280 Dementia in other diseases classified elsewhere without behavioral disturbance: Secondary | ICD-10-CM | POA: Diagnosis not present

## 2023-06-19 DIAGNOSIS — K219 Gastro-esophageal reflux disease without esophagitis: Secondary | ICD-10-CM | POA: Diagnosis not present

## 2023-06-19 DIAGNOSIS — F0284 Dementia in other diseases classified elsewhere, unspecified severity, with anxiety: Secondary | ICD-10-CM | POA: Diagnosis not present

## 2023-06-19 DIAGNOSIS — M19072 Primary osteoarthritis, left ankle and foot: Secondary | ICD-10-CM | POA: Diagnosis not present

## 2023-06-19 DIAGNOSIS — E538 Deficiency of other specified B group vitamins: Secondary | ICD-10-CM | POA: Diagnosis not present

## 2023-06-19 DIAGNOSIS — F410 Panic disorder [episodic paroxysmal anxiety] without agoraphobia: Secondary | ICD-10-CM | POA: Diagnosis not present

## 2023-06-19 DIAGNOSIS — I499 Cardiac arrhythmia, unspecified: Secondary | ICD-10-CM | POA: Diagnosis not present

## 2023-06-19 DIAGNOSIS — M19071 Primary osteoarthritis, right ankle and foot: Secondary | ICD-10-CM | POA: Diagnosis not present

## 2023-06-19 DIAGNOSIS — K649 Unspecified hemorrhoids: Secondary | ICD-10-CM | POA: Diagnosis not present

## 2023-06-19 DIAGNOSIS — E663 Overweight: Secondary | ICD-10-CM | POA: Diagnosis not present

## 2023-06-19 DIAGNOSIS — I5181 Takotsubo syndrome: Secondary | ICD-10-CM | POA: Diagnosis not present

## 2023-06-19 DIAGNOSIS — Z6824 Body mass index (BMI) 24.0-24.9, adult: Secondary | ICD-10-CM | POA: Diagnosis not present

## 2023-06-26 DIAGNOSIS — E538 Deficiency of other specified B group vitamins: Secondary | ICD-10-CM | POA: Diagnosis not present

## 2023-06-26 DIAGNOSIS — E663 Overweight: Secondary | ICD-10-CM | POA: Diagnosis not present

## 2023-06-26 DIAGNOSIS — K219 Gastro-esophageal reflux disease without esophagitis: Secondary | ICD-10-CM | POA: Diagnosis not present

## 2023-06-26 DIAGNOSIS — E785 Hyperlipidemia, unspecified: Secondary | ICD-10-CM | POA: Diagnosis not present

## 2023-06-26 DIAGNOSIS — F0284 Dementia in other diseases classified elsewhere, unspecified severity, with anxiety: Secondary | ICD-10-CM | POA: Diagnosis not present

## 2023-06-26 DIAGNOSIS — M19072 Primary osteoarthritis, left ankle and foot: Secondary | ICD-10-CM | POA: Diagnosis not present

## 2023-06-26 DIAGNOSIS — M8580 Other specified disorders of bone density and structure, unspecified site: Secondary | ICD-10-CM | POA: Diagnosis not present

## 2023-06-26 DIAGNOSIS — F410 Panic disorder [episodic paroxysmal anxiety] without agoraphobia: Secondary | ICD-10-CM | POA: Diagnosis not present

## 2023-06-26 DIAGNOSIS — I499 Cardiac arrhythmia, unspecified: Secondary | ICD-10-CM | POA: Diagnosis not present

## 2023-06-26 DIAGNOSIS — K649 Unspecified hemorrhoids: Secondary | ICD-10-CM | POA: Diagnosis not present

## 2023-06-26 DIAGNOSIS — I5181 Takotsubo syndrome: Secondary | ICD-10-CM | POA: Diagnosis not present

## 2023-06-26 DIAGNOSIS — M19071 Primary osteoarthritis, right ankle and foot: Secondary | ICD-10-CM | POA: Diagnosis not present

## 2023-06-26 DIAGNOSIS — Z6824 Body mass index (BMI) 24.0-24.9, adult: Secondary | ICD-10-CM | POA: Diagnosis not present

## 2023-06-26 DIAGNOSIS — F0280 Dementia in other diseases classified elsewhere without behavioral disturbance: Secondary | ICD-10-CM | POA: Diagnosis not present

## 2023-06-26 DIAGNOSIS — F329 Major depressive disorder, single episode, unspecified: Secondary | ICD-10-CM | POA: Diagnosis not present

## 2023-06-26 DIAGNOSIS — I10 Essential (primary) hypertension: Secondary | ICD-10-CM | POA: Diagnosis not present

## 2023-07-03 DIAGNOSIS — M19072 Primary osteoarthritis, left ankle and foot: Secondary | ICD-10-CM | POA: Diagnosis not present

## 2023-07-03 DIAGNOSIS — Z6824 Body mass index (BMI) 24.0-24.9, adult: Secondary | ICD-10-CM | POA: Diagnosis not present

## 2023-07-03 DIAGNOSIS — M19071 Primary osteoarthritis, right ankle and foot: Secondary | ICD-10-CM | POA: Diagnosis not present

## 2023-07-03 DIAGNOSIS — E663 Overweight: Secondary | ICD-10-CM | POA: Diagnosis not present

## 2023-07-03 DIAGNOSIS — F410 Panic disorder [episodic paroxysmal anxiety] without agoraphobia: Secondary | ICD-10-CM | POA: Diagnosis not present

## 2023-07-03 DIAGNOSIS — F329 Major depressive disorder, single episode, unspecified: Secondary | ICD-10-CM | POA: Diagnosis not present

## 2023-07-03 DIAGNOSIS — I5181 Takotsubo syndrome: Secondary | ICD-10-CM | POA: Diagnosis not present

## 2023-07-03 DIAGNOSIS — F0280 Dementia in other diseases classified elsewhere without behavioral disturbance: Secondary | ICD-10-CM | POA: Diagnosis not present

## 2023-07-03 DIAGNOSIS — E538 Deficiency of other specified B group vitamins: Secondary | ICD-10-CM | POA: Diagnosis not present

## 2023-07-03 DIAGNOSIS — I10 Essential (primary) hypertension: Secondary | ICD-10-CM | POA: Diagnosis not present

## 2023-07-03 DIAGNOSIS — E785 Hyperlipidemia, unspecified: Secondary | ICD-10-CM | POA: Diagnosis not present

## 2023-07-03 DIAGNOSIS — F0284 Dementia in other diseases classified elsewhere, unspecified severity, with anxiety: Secondary | ICD-10-CM | POA: Diagnosis not present

## 2023-07-03 DIAGNOSIS — I499 Cardiac arrhythmia, unspecified: Secondary | ICD-10-CM | POA: Diagnosis not present

## 2023-07-03 DIAGNOSIS — K649 Unspecified hemorrhoids: Secondary | ICD-10-CM | POA: Diagnosis not present

## 2023-07-03 DIAGNOSIS — M8580 Other specified disorders of bone density and structure, unspecified site: Secondary | ICD-10-CM | POA: Diagnosis not present

## 2023-07-03 DIAGNOSIS — K219 Gastro-esophageal reflux disease without esophagitis: Secondary | ICD-10-CM | POA: Diagnosis not present

## 2023-07-05 DIAGNOSIS — F329 Major depressive disorder, single episode, unspecified: Secondary | ICD-10-CM | POA: Diagnosis not present

## 2023-07-05 DIAGNOSIS — I499 Cardiac arrhythmia, unspecified: Secondary | ICD-10-CM | POA: Diagnosis not present

## 2023-07-05 DIAGNOSIS — E538 Deficiency of other specified B group vitamins: Secondary | ICD-10-CM | POA: Diagnosis not present

## 2023-07-05 DIAGNOSIS — M8580 Other specified disorders of bone density and structure, unspecified site: Secondary | ICD-10-CM | POA: Diagnosis not present

## 2023-07-05 DIAGNOSIS — K649 Unspecified hemorrhoids: Secondary | ICD-10-CM | POA: Diagnosis not present

## 2023-07-05 DIAGNOSIS — E663 Overweight: Secondary | ICD-10-CM | POA: Diagnosis not present

## 2023-07-05 DIAGNOSIS — F0280 Dementia in other diseases classified elsewhere without behavioral disturbance: Secondary | ICD-10-CM | POA: Diagnosis not present

## 2023-07-05 DIAGNOSIS — F410 Panic disorder [episodic paroxysmal anxiety] without agoraphobia: Secondary | ICD-10-CM | POA: Diagnosis not present

## 2023-07-05 DIAGNOSIS — K219 Gastro-esophageal reflux disease without esophagitis: Secondary | ICD-10-CM | POA: Diagnosis not present

## 2023-07-05 DIAGNOSIS — F0284 Dementia in other diseases classified elsewhere, unspecified severity, with anxiety: Secondary | ICD-10-CM | POA: Diagnosis not present

## 2023-07-05 DIAGNOSIS — Z6824 Body mass index (BMI) 24.0-24.9, adult: Secondary | ICD-10-CM | POA: Diagnosis not present

## 2023-07-05 DIAGNOSIS — I5181 Takotsubo syndrome: Secondary | ICD-10-CM | POA: Diagnosis not present

## 2023-07-05 DIAGNOSIS — I10 Essential (primary) hypertension: Secondary | ICD-10-CM | POA: Diagnosis not present

## 2023-07-05 DIAGNOSIS — M19072 Primary osteoarthritis, left ankle and foot: Secondary | ICD-10-CM | POA: Diagnosis not present

## 2023-07-05 DIAGNOSIS — M19071 Primary osteoarthritis, right ankle and foot: Secondary | ICD-10-CM | POA: Diagnosis not present

## 2023-07-05 DIAGNOSIS — E785 Hyperlipidemia, unspecified: Secondary | ICD-10-CM | POA: Diagnosis not present

## 2023-07-09 DIAGNOSIS — F410 Panic disorder [episodic paroxysmal anxiety] without agoraphobia: Secondary | ICD-10-CM | POA: Diagnosis not present

## 2023-07-09 DIAGNOSIS — E538 Deficiency of other specified B group vitamins: Secondary | ICD-10-CM | POA: Diagnosis not present

## 2023-07-09 DIAGNOSIS — E785 Hyperlipidemia, unspecified: Secondary | ICD-10-CM | POA: Diagnosis not present

## 2023-07-09 DIAGNOSIS — F0284 Dementia in other diseases classified elsewhere, unspecified severity, with anxiety: Secondary | ICD-10-CM | POA: Diagnosis not present

## 2023-07-09 DIAGNOSIS — K649 Unspecified hemorrhoids: Secondary | ICD-10-CM | POA: Diagnosis not present

## 2023-07-09 DIAGNOSIS — I10 Essential (primary) hypertension: Secondary | ICD-10-CM | POA: Diagnosis not present

## 2023-07-09 DIAGNOSIS — I499 Cardiac arrhythmia, unspecified: Secondary | ICD-10-CM | POA: Diagnosis not present

## 2023-07-09 DIAGNOSIS — E663 Overweight: Secondary | ICD-10-CM | POA: Diagnosis not present

## 2023-07-09 DIAGNOSIS — F0280 Dementia in other diseases classified elsewhere without behavioral disturbance: Secondary | ICD-10-CM | POA: Diagnosis not present

## 2023-07-09 DIAGNOSIS — Z6824 Body mass index (BMI) 24.0-24.9, adult: Secondary | ICD-10-CM | POA: Diagnosis not present

## 2023-07-09 DIAGNOSIS — M8580 Other specified disorders of bone density and structure, unspecified site: Secondary | ICD-10-CM | POA: Diagnosis not present

## 2023-07-09 DIAGNOSIS — F329 Major depressive disorder, single episode, unspecified: Secondary | ICD-10-CM | POA: Diagnosis not present

## 2023-07-09 DIAGNOSIS — M19072 Primary osteoarthritis, left ankle and foot: Secondary | ICD-10-CM | POA: Diagnosis not present

## 2023-07-09 DIAGNOSIS — I5181 Takotsubo syndrome: Secondary | ICD-10-CM | POA: Diagnosis not present

## 2023-07-09 DIAGNOSIS — M19071 Primary osteoarthritis, right ankle and foot: Secondary | ICD-10-CM | POA: Diagnosis not present

## 2023-07-09 DIAGNOSIS — K219 Gastro-esophageal reflux disease without esophagitis: Secondary | ICD-10-CM | POA: Diagnosis not present

## 2023-08-04 DIAGNOSIS — H40033 Anatomical narrow angle, bilateral: Secondary | ICD-10-CM | POA: Diagnosis not present

## 2023-08-04 DIAGNOSIS — H25813 Combined forms of age-related cataract, bilateral: Secondary | ICD-10-CM | POA: Diagnosis not present

## 2023-08-04 DIAGNOSIS — H1013 Acute atopic conjunctivitis, bilateral: Secondary | ICD-10-CM | POA: Diagnosis not present

## 2023-08-04 DIAGNOSIS — H04121 Dry eye syndrome of right lacrimal gland: Secondary | ICD-10-CM | POA: Diagnosis not present

## 2023-08-13 ENCOUNTER — Other Ambulatory Visit: Payer: Self-pay | Admitting: Cardiovascular Disease

## 2023-08-14 DIAGNOSIS — M4126 Other idiopathic scoliosis, lumbar region: Secondary | ICD-10-CM | POA: Diagnosis not present

## 2023-08-27 DIAGNOSIS — M5416 Radiculopathy, lumbar region: Secondary | ICD-10-CM | POA: Diagnosis not present

## 2023-08-27 DIAGNOSIS — M4126 Other idiopathic scoliosis, lumbar region: Secondary | ICD-10-CM | POA: Diagnosis not present

## 2023-09-11 DIAGNOSIS — M5416 Radiculopathy, lumbar region: Secondary | ICD-10-CM | POA: Diagnosis not present

## 2023-11-25 DIAGNOSIS — E785 Hyperlipidemia, unspecified: Secondary | ICD-10-CM | POA: Diagnosis not present

## 2023-11-25 DIAGNOSIS — E538 Deficiency of other specified B group vitamins: Secondary | ICD-10-CM | POA: Diagnosis not present

## 2023-11-26 DIAGNOSIS — E785 Hyperlipidemia, unspecified: Secondary | ICD-10-CM | POA: Diagnosis not present

## 2023-11-26 DIAGNOSIS — M8580 Other specified disorders of bone density and structure, unspecified site: Secondary | ICD-10-CM | POA: Diagnosis not present

## 2023-11-26 DIAGNOSIS — R7301 Impaired fasting glucose: Secondary | ICD-10-CM | POA: Diagnosis not present

## 2023-12-02 DIAGNOSIS — Z23 Encounter for immunization: Secondary | ICD-10-CM | POA: Diagnosis not present

## 2023-12-02 DIAGNOSIS — Z1331 Encounter for screening for depression: Secondary | ICD-10-CM | POA: Diagnosis not present

## 2023-12-02 DIAGNOSIS — Z0000 Encounter for general adult medical examination without abnormal findings: Secondary | ICD-10-CM | POA: Diagnosis not present

## 2023-12-02 DIAGNOSIS — I10 Essential (primary) hypertension: Secondary | ICD-10-CM | POA: Diagnosis not present

## 2023-12-15 DIAGNOSIS — M5416 Radiculopathy, lumbar region: Secondary | ICD-10-CM | POA: Diagnosis not present

## 2023-12-30 DIAGNOSIS — M4126 Other idiopathic scoliosis, lumbar region: Secondary | ICD-10-CM | POA: Diagnosis not present

## 2023-12-30 DIAGNOSIS — M7061 Trochanteric bursitis, right hip: Secondary | ICD-10-CM | POA: Diagnosis not present

## 2023-12-30 DIAGNOSIS — M5416 Radiculopathy, lumbar region: Secondary | ICD-10-CM | POA: Diagnosis not present

## 2024-02-22 NOTE — Progress Notes (Signed)
 Chief Complaint: Dysphagia Primary GI MD: Dr. Elvin Hammer  HPI: 83 year old female history of dementia, hypertension, cholelithiasis, constipation, presents for evaluation of dysphagia referred by her PCP  History of constipation last seen by Dr. Elvin Hammer 2022  Discussed the use of AI scribe software for clinical note transcription with the patient, who gave verbal consent to proceed.  History of Present Illness      PREVIOUS GI WORKUP   Colonoscopy 06/2020 - Diverticulosis and benign scattered erosions, biopsies negative - Otherwise normal  EGD 02/2015 1. GERD with mild esophagitis  2. Prior gastric surgery, presumably from ulcer disease  Past Medical History:  Diagnosis Date   Anxiety    on meds   Arthritis    OA bil shoulders, hands, back   Back pain    BPPV (benign paroxysmal positional vertigo) 10/2019   Bursitis    Colon polyps    adenomatous   Colon polyps    Depression    on meds   Diverticulosis    Frequent falls 03/03/2021   GERD (gastroesophageal reflux disease)    on meds   Hemorrhoids    Hyperlipidemia    on meds   Mild hypertension    white coat syndrome- on meds   Myocardial infarction (HCC)    OAB (overactive bladder)    Osteopenia    Panic disorder 11/2012   Plantar fasciitis    hx of   PUD (peptic ulcer disease)    Shingles    Stroke (HCC)    Takotsubo cardiomyopathy    Tremor    mild action tremor suspected 2020   Vitamin D  deficiency    on meds    Past Surgical History:  Procedure Laterality Date   ABDOMINAL HYSTERECTOMY     APPENDECTOMY     BACK SURGERY     COLONOSCOPY  2016   [id number]   FOOT SURGERY     bilateral    HEMORRHOID SURGERY     JOINT REPLACEMENT Right 2014   laproscopic knee surgery Right    2018   LEFT HEART CATH AND CORONARY ANGIOGRAPHY N/A 09/28/2020   Procedure: LEFT HEART CATH AND CORONARY ANGIOGRAPHY;  Surgeon: Arnoldo Lapping, MD;  Location: [facility] INVASIVE CV LAB;  Service: Cardiovascular;  Laterality: N/A;    LUMBAR LAMINECTOMY/DECOMPRESSION MICRODISCECTOMY  05/07/2012   Procedure: LUMBAR LAMINECTOMY/DECOMPRESSION MICRODISCECTOMY 1 LEVEL;  Surgeon: Isadora Mar, MD;  Location: [facility] NEURO ORS;  Service: Neurosurgery;  Laterality: Right;  Lumbar Laminectomy Decompression Microdiscectomy Lumbar Three-Four   multiple foot surgeries     POLYPECTOMY  2016   TA   REVERSE SHOULDER ARTHROPLASTY Left 07/12/2020   Procedure: REVERSE SHOULDER ARTHROPLASTY;  Surgeon: Micheline Ahr, MD;  Location: [facility];  Service: Orthopedics;  Laterality: Left;   ROTATOR CUFF REPAIR     right side   TONSILLECTOMY     TOTAL HIP ARTHROPLASTY  11/16/2012   Procedure: TOTAL HIP ARTHROPLASTY;  Surgeon: Aurther Blue, MD;  Location: [facility] ORS;  Service: Orthopedics;  Laterality: Right;  Right Total Hip Arthroplasty   TOTAL KNEE ARTHROPLASTY Right 03/20/2021   Procedure: TOTAL KNEE ARTHROPLASTY;  Surgeon: Saundra Curl, MD;  Location: [facility] ORS;  Service: Orthopedics;  Laterality: Right;   TUBAL LIGATION     VAGOTOMY     approx. 35 years ago, states has a clamp mid epigastric area    Current Outpatient Medications  Medication Sig Dispense Refill   acetaminophen  (TYLENOL ) 500 MG tablet Take 2 tablets (1,000 mg total)  by mouth every 6 (six) hours as needed for mild pain or moderate pain. 60 tablet 0   buPROPion  (WELLBUTRIN  XL) 300 MG 24 hr tablet Take 300 mg by mouth daily before breakfast.      carvedilol  (COREG ) 12.5 MG tablet Take 1 tablet (12.5 mg total) by mouth 2 (two) times daily with a meal. 180 tablet 2   Cholecalciferol  (VITAMIN D -3) 125 MCG (5000 UT) TABS Take 5,000 Units by mouth daily after breakfast.     cyclobenzaprine  (FLEXERIL ) 5 MG tablet Take 5 mg by mouth 2 (two) times daily.     donepezil  (ARICEPT ) 10 MG tablet Take 10 mg by mouth at bedtime.     DULoxetine  (CYMBALTA ) 60 MG capsule Take 60 mg by mouth daily.     ezetimibe  (ZETIA ) 10 MG tablet Take 10 mg by mouth daily.     gabapentin   (NEURONTIN ) 300 MG capsule TAKE 1 CAPSULE(300 MG) BY MOUTH AT BEDTIME (Patient taking differently: Take 300 mg by mouth at bedtime.) 30 capsule 3   LORazepam  (ATIVAN ) 0.5 MG tablet Take 0.5 mg by mouth every 8 (eight) hours as needed for anxiety.     memantine (NAMENDA) 10 MG tablet Take 10 mg by mouth 2 (two) times daily.     MYRBETRIQ  50 MG TB24 tablet Take 50 mg by mouth daily.     ondansetron  (ZOFRAN -ODT) 8 MG disintegrating tablet Take 8 mg by mouth as needed.     pantoprazole  (PROTONIX ) 40 MG tablet TAKE 1 TABLET (40 MG TOTAL) BY MOUTH DAILY AT 6AM. (Patient taking differently: Take 40 mg by mouth daily.) 30 tablet 0   raloxifene  (EVISTA ) 60 MG tablet Take 60 mg by mouth daily.     rosuvastatin  (CRESTOR ) 20 MG tablet Take 1 tablet by mouth daily.     vitamin B-12 (CYANOCOBALAMIN ) 1000 MCG tablet Take 1,000 mcg by mouth daily.     No current facility-administered medications for this visit.    Allergies as of 02/23/2024   (No Known Allergies)    Family History  Problem Relation Age of Onset   Colon polyps Mother    Stroke Mother    Hypertension Mother    Congestive Heart Failure Mother    Stroke Other        several family members on mother's side   Lung cancer Father 56   Heart disease Father    Colon cancer Neg Hx    Rectal cancer Neg Hx    Stomach cancer Neg Hx    Esophageal cancer Neg Hx     Social History   Socioeconomic History   Marital status: Widowed    Spouse name: Not on file   Number of children: 3   Years of education: cosmetology degree   Highest education level: GED or equivalent  Occupational History   Occupation: Retired  Tobacco Use   Smoking status: Former    Current packs/day: 0.00    Average packs/day: 1 pack/day for 20.0 years (20.0 ttl pk-yrs)    Types: Cigarettes    Start date: 1963    Quit date: 1983    Years since quitting: 42.3   Smokeless tobacco: Never   Tobacco comments:    45 years ago  Vaping Use   Vaping status: Never Used   Substance and Sexual Activity   Alcohol  use: Yes    Alcohol /week: 2.0 - 4.0 standard drinks of alcohol     Types: 2 - 4 Glasses of wine per week    Comment: every  3-4 days   Drug use: No   Sexual activity: Not Currently    Partners: Male  Other Topics Concern   Not on file  Social History Narrative   Lives alone   Right handed   Social Drivers of Health   Financial Resource Strain: Not on file  Food Insecurity: Not on file  Transportation Needs: Not on file  Physical Activity: Not on file  Stress: Not on file  Social Connections: Not on file  Intimate Partner Violence: Not on file    Review of Systems:    Constitutional: No weight loss, fever, chills, weakness or fatigue HEENT: Eyes: No change in vision               Ears, Nose, Throat:  No change in hearing or congestion Skin: No rash or itching Cardiovascular: No chest pain, chest pressure or palpitations   Respiratory: No SOB or cough Gastrointestinal: See HPI and otherwise negative Genitourinary: No dysuria or change in urinary frequency Neurological: No headache, dizziness or syncope Musculoskeletal: No new muscle or joint pain Hematologic: No bleeding or bruising Psychiatric: No history of depression or anxiety    Physical Exam:  Vital signs: There were no vitals taken for this visit.  Constitutional: NAD, alert and cooperative Head:  Normocephalic and atraumatic. Eyes:   PEERL, EOMI. No icterus. Conjunctiva pink. Respiratory: Respirations even and unlabored. Lungs clear to auscultation bilaterally.   No wheezes, crackles, or rhonchi.  Cardiovascular:  Regular rate and rhythm. No peripheral edema, cyanosis or pallor.  Gastrointestinal:  Soft, nondistended, nontender. No rebound or guarding. Normal bowel sounds. No appreciable masses or hepatomegaly. Rectal:  Declines Msk:  Symmetrical without gross deformities. Without edema, no deformity or joint abnormality.  Neurologic:  Alert and  oriented x4;  grossly  normal neurologically.  Skin:   Dry and intact without significant lesions or rashes. Psychiatric: Oriented to person, place and time. Demonstrates good judgement and reason without abnormal affect or behaviors.  Physical Exam    RELEVANT LABS AND IMAGING: CBC    Component Value Date/Time   WBC 5.0 03/08/2021 1429   RBC 3.94 03/08/2021 1429   HGB 12.1 03/08/2021 1429   HCT 39.3 03/08/2021 1429   PLT 209 03/08/2021 1429   MCV 99.7 03/08/2021 1429   MCH 30.7 03/08/2021 1429   MCHC 30.8 03/08/2021 1429   RDW 14.5 03/08/2021 1429   LYMPHSABS 1.5 09/28/2020 1831   MONOABS 0.6 09/28/2020 1831   EOSABS 0.0 09/28/2020 1831   BASOSABS 0.0 09/28/2020 1831    CMP     Component Value Date/Time   NA 143 03/08/2021 1429   NA 143 10/25/2020 0000   K 4.3 03/08/2021 1429   CL 108 03/08/2021 1429   CO2 31 03/08/2021 1429   GLUCOSE 100 (H) 03/08/2021 1429   BUN 24 (H) 03/08/2021 1429   BUN 12 10/25/2020 0000   CREATININE 0.72 03/08/2021 1429   CALCIUM  9.0 03/08/2021 1429   PROT 5.9 (L) 10/01/2020 1004   ALBUMIN 2.9 (L) 10/01/2020 1004   AST 34 10/01/2020 1004   ALT 17 10/01/2020 1004   ALKPHOS 69 10/01/2020 1004   BILITOT 0.9 10/01/2020 1004   GFRNONAA >60 03/08/2021 1429   GFRAA 82 10/25/2020 0000     Assessment/Plan:   Assessment and Plan Assessment & Plan    Dysphagia EGD 2016 for GERD showed reflux esophagitis and prior gastric surgery presumably related to ulcer disease.  Normal CBC/CMP  Takotsubo cardiomyopathy Echocardiogram April 2023 with EF  70 to 75%  Suzanna Erp, PA-C [facility] 02/22/2024, 9:38 PM  Cc: Aldo Hun, MD

## 2024-02-23 ENCOUNTER — Ambulatory Visit: Admitting: Gastroenterology

## 2024-02-23 ENCOUNTER — Other Ambulatory Visit (INDEPENDENT_AMBULATORY_CARE_PROVIDER_SITE_OTHER)

## 2024-02-23 ENCOUNTER — Encounter: Payer: Self-pay | Admitting: Gastroenterology

## 2024-02-23 VITALS — BP 110/60 | HR 84 | Ht 61.5 in | Wt 138.0 lb

## 2024-02-23 DIAGNOSIS — R1310 Dysphagia, unspecified: Secondary | ICD-10-CM

## 2024-02-23 DIAGNOSIS — K2100 Gastro-esophageal reflux disease with esophagitis, without bleeding: Secondary | ICD-10-CM | POA: Diagnosis not present

## 2024-02-23 DIAGNOSIS — R112 Nausea with vomiting, unspecified: Secondary | ICD-10-CM

## 2024-02-23 DIAGNOSIS — K920 Hematemesis: Secondary | ICD-10-CM

## 2024-02-23 DIAGNOSIS — F411 Generalized anxiety disorder: Secondary | ICD-10-CM

## 2024-02-23 DIAGNOSIS — F419 Anxiety disorder, unspecified: Secondary | ICD-10-CM | POA: Diagnosis not present

## 2024-02-23 LAB — CBC WITH DIFFERENTIAL/PLATELET
Basophils Absolute: 0 10*3/uL (ref 0.0–0.1)
Basophils Relative: 0.1 % (ref 0.0–3.0)
Eosinophils Absolute: 0.1 10*3/uL (ref 0.0–0.7)
Eosinophils Relative: 1.2 % (ref 0.0–5.0)
HCT: 40.8 % (ref 36.0–46.0)
Hemoglobin: 13.3 g/dL (ref 12.0–15.0)
Lymphocytes Relative: 26.2 % (ref 12.0–46.0)
Lymphs Abs: 1.2 10*3/uL (ref 0.7–4.0)
MCHC: 32.7 g/dL (ref 30.0–36.0)
MCV: 100.5 fl — ABNORMAL HIGH (ref 78.0–100.0)
Monocytes Absolute: 0.6 10*3/uL (ref 0.1–1.0)
Monocytes Relative: 13.5 % — ABNORMAL HIGH (ref 3.0–12.0)
Neutro Abs: 2.7 10*3/uL (ref 1.4–7.7)
Neutrophils Relative %: 59 % (ref 43.0–77.0)
Platelets: 159 10*3/uL (ref 150.0–400.0)
RBC: 4.06 Mil/uL (ref 3.87–5.11)
RDW: 14.4 % (ref 11.5–15.5)
WBC: 4.6 10*3/uL (ref 4.0–10.5)

## 2024-02-23 MED ORDER — PANTOPRAZOLE SODIUM 40 MG PO TBEC: 40.0000 mg | DELAYED_RELEASE_TABLET | Freq: Two times a day (BID) | ORAL | 2 refills | Status: DC

## 2024-02-23 NOTE — Patient Instructions (Addendum)
 Your provider has requested that you go to the basement level for lab work before leaving today. Press "B" on the elevator. The lab is located at the first door on the left as you exit the elevator.  We have sent the following medications to your pharmacy for you to pick up at your convenience: Pantoprazole  40 mg  You have been scheduled for a Barium Esophogram at [facility] (1st floor of the hospital) on 03/12/24 at 10 am. Please arrive 30 minutes prior to your appointment for registration. Make certain not to have anything to eat or drink 3 hours prior to your test. If you need to reschedule for any reason, please contact radiology at [phone] to do so. __________________________________________________________________ A barium swallow is an examination that concentrates on views of the esophagus. This tends to be a double contrast exam (barium and two liquids which, when combined, create a gas to distend the wall of the oesophagus) or single contrast (non-ionic iodine  based). The study is usually tailored to your symptoms so a good history is essential. Attention is paid during the study to the form, structure and configuration of the esophagus, looking for functional disorders (such as aspiration, dysphagia, achalasia, motility and reflux) EXAMINATION You may be asked to change into a gown, depending on the type of swallow being performed. A radiologist and radiographer will perform the procedure. The radiologist will advise you of the type of contrast selected for your procedure and direct you during the exam. You will be asked to stand, sit or lie in several different positions and to hold a small amount of fluid in your mouth before being asked to swallow while the imaging is performed .In some instances you may be asked to swallow barium coated marshmallows to assess the motility of a solid food bolus. The exam can be recorded as a digital or video fluoroscopy procedure. POST  PROCEDURE It will take 1-2 days for the barium to pass through your system. To facilitate this, it is important, unless otherwise directed, to increase your fluids for the next 24-48hrs and to resume your normal diet.  This test typically takes about 30 minutes to perform. __________________________________________________________________________________  _______________________________________________________  If your blood pressure at your visit was 140/90 or greater, please contact your primary care physician to follow up on this.  _______________________________________________________  If you are age 82 or older, your body mass index should be between 23-30. Your Body mass index is 25.65 kg/m. If this is out of the aforementioned range listed, please consider follow up with your Primary Care Provider.  If you are age 82 or younger, your body mass index should be between 19-25. Your Body mass index is 25.65 kg/m. If this is out of the aformentioned range listed, please consider follow up with your Primary Care Provider.   ________________________________________________________  The [facility] [place] [facility] providers would like to encourage you to use MYCHART to communicate with providers for non-urgent requests or questions.  Due to long hold times on the telephone, sending your provider a message by [facility] may be a faster and more efficient way to get a response.  Please allow 48 business hours for a response.  Please remember that this is for non-urgent requests.  _______________________________________________________

## 2024-03-05 ENCOUNTER — Telehealth: Payer: Self-pay | Admitting: Gastroenterology

## 2024-03-05 NOTE — Telephone Encounter (Signed)
 Patient daughter called and stated that her mother is still throwing up bile but not as frequent. Patient daughter is wondering if she needs to have a EGD scheduled. Patient daughter is requesting a call back. Please advise.

## 2024-03-08 NOTE — Telephone Encounter (Signed)
 Spoke with pts daughter and she is aware of recommendations.

## 2024-03-08 NOTE — Telephone Encounter (Signed)
 Pts daughter called and is concerned as her mother is still "vomiting bile." Reports she was some better on Sunday and so far today. States she will eat a few meals and be fine then it will happen again. Mother also reports if she is laying down she does not get sick, when she is up and moving around it is worse. Daughter reports she has called pts PCPand was told to contact GI. Pt is scheduled for Barium swallow on Friday. Daughter wants to know if Friday is soon enough or should she have this done sooner. Please advise.

## 2024-03-09 NOTE — Progress Notes (Signed)
 Noted

## 2024-03-12 ENCOUNTER — Ambulatory Visit (HOSPITAL_COMMUNITY)
Admission: RE | Admit: 2024-03-12 | Discharge: 2024-03-12 | Disposition: A | Discharge status: Home / Self Care | Source: Ambulatory Visit | Attending: Gastroenterology | Admitting: Gastroenterology

## 2024-03-12 ENCOUNTER — Emergency Department (HOSPITAL_COMMUNITY)
Admission: EM | Admit: 2024-03-12 | Discharge: 2024-03-12 | Disposition: A | Discharge status: Home / Self Care | Attending: Emergency Medicine | Admitting: Emergency Medicine

## 2024-03-12 ENCOUNTER — Other Ambulatory Visit: Payer: Self-pay

## 2024-03-12 DIAGNOSIS — F0390 Unspecified dementia without behavioral disturbance: Secondary | ICD-10-CM | POA: Diagnosis not present

## 2024-03-12 DIAGNOSIS — R112 Nausea with vomiting, unspecified: Secondary | ICD-10-CM | POA: Insufficient documentation

## 2024-03-12 DIAGNOSIS — R1310 Dysphagia, unspecified: Secondary | ICD-10-CM | POA: Diagnosis not present

## 2024-03-12 DIAGNOSIS — I10 Essential (primary) hypertension: Secondary | ICD-10-CM | POA: Diagnosis not present

## 2024-03-12 DIAGNOSIS — D72829 Elevated white blood cell count, unspecified: Secondary | ICD-10-CM | POA: Diagnosis not present

## 2024-03-12 DIAGNOSIS — I2510 Atherosclerotic heart disease of native coronary artery without angina pectoris: Secondary | ICD-10-CM | POA: Insufficient documentation

## 2024-03-12 LAB — CBC
HCT: 42.7 % (ref 36.0–46.0)
Hemoglobin: 13.5 g/dL (ref 12.0–15.0)
MCH: 32.5 pg (ref 26.0–34.0)
MCHC: 31.6 g/dL (ref 30.0–36.0)
MCV: 102.9 fL — ABNORMAL HIGH (ref 80.0–100.0)
Platelets: 223 10*3/uL (ref 150–400)
RBC: 4.15 MIL/uL (ref 3.87–5.11)
RDW: 13.8 % (ref 11.5–15.5)
WBC: 7.5 10*3/uL (ref 4.0–10.5)
nRBC: 0 % (ref 0.0–0.2)

## 2024-03-12 LAB — COMPREHENSIVE METABOLIC PANEL WITH GFR
ALT: 15 U/L (ref 0–44)
AST: 27 U/L (ref 15–41)
Albumin: 3.3 g/dL — ABNORMAL LOW (ref 3.5–5.0)
Alkaline Phosphatase: 48 U/L (ref 38–126)
Anion gap: 7 (ref 5–15)
BUN: 21 mg/dL (ref 8–23)
CO2: 28 mmol/L (ref 22–32)
Calcium: 8.9 mg/dL (ref 8.9–10.3)
Chloride: 102 mmol/L (ref 98–111)
Creatinine, Ser: 0.77 mg/dL (ref 0.44–1.00)
GFR, Estimated: >60 mL/min (ref >60)
Glucose, Bld: 95 mg/dL (ref 70–99)
Potassium: 4.1 mmol/L (ref 3.5–5.1)
Sodium: 137 mmol/L (ref 135–145)
Total Bilirubin: 0.8 mg/dL (ref 0.0–1.2)
Total Protein: 6 g/dL — ABNORMAL LOW (ref 6.5–8.1)

## 2024-03-12 LAB — LIPASE, BLOOD: Lipase: 26 U/L (ref 11–51)

## 2024-03-12 LAB — TYPE AND SCREEN
ABO/RH(D): O NEG
Antibody Screen: NEGATIVE

## 2024-03-12 LAB — POC OCCULT BLOOD, ED: Fecal Occult Bld: NEGATIVE

## 2024-03-12 MED ORDER — ONDANSETRON 4 MG PO TBDP
4.0000 mg | ORAL_TABLET | Freq: Once | ORAL | Status: AC
  Administered 2024-03-12: 4 mg via ORAL
  Filled 2024-03-12: qty 1

## 2024-03-12 MED ORDER — ONDANSETRON HCL 4 MG PO TABS: 4.0000 mg | ORAL_TABLET | Freq: Four times a day (QID) | ORAL | 0 refills | Status: AC

## 2024-03-12 MED ORDER — SUCRALFATE 1 G PO TABS: 1.0000 g | ORAL_TABLET | Freq: Three times a day (TID) | ORAL | 0 refills | Status: DC

## 2024-03-12 NOTE — ED Provider Notes (Signed)
 [place] EMERGENCY DEPARTMENT AT [facility] Provider Note   CSN: [id number] Arrival date & time: 03/12/24  1106     History {Add pertinent medical, surgical, social history, OB history to HPI:1} Chief Complaint  Patient presents with   Hematemesis    Colleen Glenn is a 83 y.o. female.  HPI     Home Medications Prior to Admission medications   Medication Sig Start Date End Date Taking? Authorizing Provider  acetaminophen  (TYLENOL ) 500 MG tablet Take 2 tablets (1,000 mg total) by mouth every 6 (six) hours as needed for mild pain or moderate pain. 03/21/21   Gawne, Meghan M, PA-C  buPROPion  (WELLBUTRIN  XL) 300 MG 24 hr tablet Take 300 mg by mouth daily before breakfast.     [provider]  carvedilol  (COREG ) 12.5 MG tablet Take 1 tablet (12.5 mg total) by mouth 2 (two) times daily with a meal. 08/13/23   Arnoldo Lapping, MD  Cholecalciferol  (VITAMIN D -3) 125 MCG (5000 UT) TABS Take 5,000 Units by mouth daily after breakfast.    [provider]  cyclobenzaprine  (FLEXERIL ) 5 MG tablet Take 5 mg by mouth 2 (two) times daily. 04/10/22   [provider]  donepezil  (ARICEPT ) 10 MG tablet Take 10 mg by mouth at bedtime.    [provider]  DULoxetine  (CYMBALTA ) 60 MG capsule Take 60 mg by mouth daily. 10/25/19   [provider]  ezetimibe  (ZETIA ) 10 MG tablet Take 10 mg by mouth daily. 11/18/19   [provider]  gabapentin  (NEURONTIN ) 300 MG capsule TAKE 1 CAPSULE(300 MG) BY MOUTH AT BEDTIME Patient taking differently: Take 300 mg by mouth at bedtime. 07/11/20   Glory Larsen, MD  LORazepam  (ATIVAN ) 0.5 MG tablet Take 0.5 mg by mouth every 8 (eight) hours as needed for anxiety.    [provider]  memantine (NAMENDA) 10 MG tablet Take 10 mg by mouth 2 (two) times daily. 03/01/22   [provider]  MYRBETRIQ  50 MG TB24 tablet Take 50 mg by mouth daily. 03/06/20   [provider]  ondansetron   (ZOFRAN -ODT) 8 MG disintegrating tablet Take 8 mg by mouth as needed. 01/27/23   [provider]  pantoprazole  (PROTONIX ) 40 MG tablet Take 1 tablet (40 mg total) by mouth 2 (two) times daily. 02/23/24   McMichael, Elba Greathouse, PA-C  raloxifene  (EVISTA ) 60 MG tablet Take 60 mg by mouth daily.    [provider]  rosuvastatin  (CRESTOR ) 20 MG tablet Take 1 tablet by mouth daily.    [provider]  vitamin B-12 (CYANOCOBALAMIN ) 1000 MCG tablet Take 1,000 mcg by mouth daily.    [provider]      Allergies    Zocor  [simvastatin ] and Zolpidem     Review of Systems   Review of Systems  Physical Exam Updated Vital Signs BP 132/67   Pulse 81   Temp 97.8 F (36.6 C) (Oral)   Resp 19   Ht 5\' 1"  (1.549 m)   Wt 62.6 kg   SpO2 99%   BMI 26.07 kg/m  Physical Exam  ED Results / Procedures / Treatments   Labs (all labs ordered are listed, but only abnormal results are displayed) Labs Reviewed  COMPREHENSIVE METABOLIC PANEL WITH GFR - Abnormal; Notable for the following components:      Result Value   Total Protein 6.0 (*)    Albumin 3.3 (*)    All other components within normal limits  CBC - Abnormal; Notable for  the following components:   MCV 102.9 (*)    All other components within normal limits  LIPASE, BLOOD  POC OCCULT BLOOD, ED  TYPE AND SCREEN    EKG EKG Interpretation Date/Time:  Friday Mar 12 2024 11:20:42 EDT Ventricular Rate:  77 PR Interval:  160 QRS Duration:  88 QT Interval:  375 QTC Calculation: 425 R Axis:   19  Text Interpretation: Ectopic atrial rhythm ST elevation, consider inferior injury No significant change since last tracing Confirmed by Sueellen Emery [id number]) on 03/12/2024 12:13:18 PM  Radiology DG ESOPHAGUS W DOUBLE CM (HD) Result Date: 03/12/2024 CLINICAL DATA:  Provided history: Dysphagia, unspecified type. EXAM: ESOPHOGRAM/BARIUM SWALLOW TECHNIQUE: A prematurely terminated and very limited single contrast  esophagram was performed. Only single cine fluoroscopic series of the cervical esophagus was acquired in the lateral projection. Subsequently, the patient became nauseous and began vomiting and the examination was discontinued. FLUOROSCOPY: Radiation Exposure Index (as provided by the fluoroscopic device): 0.70 mGy Kerma COMPARISON:  None. FINDINGS: Only a single cine fluoroscopic series of the cervical esophagus was acquired in lateral projection. Subsequently, the patient became nauseous and began vomiting and the examination was discontinued. Mildly prominent cricopharyngeus impression upon the hypopharynx/cervical esophagus. Otherwise unremarkable appearance of the cervical esophagus in the lateral projection. These results will be called to the ordering clinician or representative by the Radiologist Assistant, and communication documented in the PACS or [employer]. IMPRESSION: 1. Prematurely terminated and very limited single contrast esophagram. Only a single cine fluoroscopic series of the cervical esophagus was acquired in the lateral projection. Subsequently, the patient became nauseous and began vomiting and examination was discontinued. 2. Mildly prominent cricopharyngeus impression upon the hypopharynx/cervical esophagus. Otherwise unremarkable appearance of the cervical esophagus in the lateral projection. 3. The remainder of the esophagus was not assessed. Electronically Signed   By: Bascom Lily D.O.   On: 03/12/2024 11:50    Procedures Procedures  {Document cardiac monitor, telemetry assessment procedure when appropriate:1}  Medications Ordered in ED Medications  ondansetron  (ZOFRAN -ODT) disintegrating tablet 4 mg (4 mg Oral Given 03/12/24 1515)    ED Course/ Medical Decision Making/ A&P Clinical Course as of 03/12/24 1605  Fri Mar 12, 2024  1434 No frank melena or blood seen on DRE. Consult for GI placed.  [RR]  1444 On the phone with Bridgette Campus, [place] with [facility].  [RR]  1448  Carafate QID  Possibly schedule Zofran  out.  Does not think emergent endoscopy is needed and can follow up with outpatient GI.  [RR]    Clinical Course User Index [RR] Spence Dux, PA-C   {   Click here for ABCD2, HEART and other calculatorsREFRESH Note before signing :1}                              Medical Decision Making Amount and/or Complexity of Data Reviewed Labs: ordered.  Risk Prescription drug management.   ***  {Document critical care time when appropriate:1} {Document review of labs and clinical decision tools ie heart score, Chads2Vasc2 etc:1}  {Document your independent review of radiology images, and any outside records:1} {Document your discussion with family members, caretakers, and with consultants:1} {Document social determinants of health affecting pt's care:1} {Document your decision making why or why not admission, treatments were needed:1} Final Clinical Impression(s) / ED Diagnoses Final diagnoses:  None    Rx / DC Orders ED Discharge Orders     None

## 2024-03-12 NOTE — ED Triage Notes (Signed)
 Pt from radiation. C/o periods of N/V with dark colored emesis.  No abd pain.  AOx4

## 2024-03-12 NOTE — Discharge Instructions (Addendum)
 You were seen in the ER today for evaluation of your vomiting.  I have spoken with your gastroenterologist who would like for you to start a new medication called Carafate.  You will take this 3 times a day with meals and once at night.  They would also like for you to start scheduling your Zofran  to help prevent your nausea.  I have sent in a refill of this as well.  Please make sure you are keeping a food diary as well as if she does vomit the consistency in time which this happens.  Someone with [facility] should be reaching out to you in the next few days to reschedule an appointment.  Please make sure you are still taking the pantoprazole  twice a day as well.  If you have any concerns, new or worsening symptoms, please return to your nearest emergency department for reevaluation.  Contact a doctor if: Your symptoms get worse. You have new symptoms. You have a fever. You cannot drink fluids without vomiting. You feel like you may vomit for more than 2 days. You feel light-headed or dizzy. You have a headache. You have muscle cramps. You have a rash. You have pain while peeing. Get help right away if: You have pain in your chest, neck, arm, or jaw. You feel very weak or you faint. You vomit again and again. You have vomit that is bright red or looks like black coffee grounds. You have bloody or black poop (stools) or poop that looks like tar. You have a very bad headache, a stiff neck, or both. You have very bad pain, cramping, or bloating in your belly (abdomen). You have trouble breathing. You are breathing very quickly. Your heart is beating very quickly. Your skin feels cold and clammy. You feel confused. You have signs of losing too much water  in your body, such as: Dark pee, very little pee, or no pee. Cracked lips. Dry mouth. Sunken eyes. Sleepiness. Weakness. These symptoms may be an emergency. Get help right away. Call 911. Do not wait to see if the symptoms will go  away. Do not drive yourself to the hospital.

## 2024-03-15 ENCOUNTER — Ambulatory Visit: Payer: Self-pay | Admitting: Gastroenterology

## 2024-03-16 NOTE — Telephone Encounter (Signed)
 Called and left message for pts daughter that we had discussed last week that she has issues with vomiting when she gets anxious and when she is up moving around. Suggested they continue the zofran  every 8 hours and push fluids. Let her know if pt is unable to keep her meds down or fluids down they would need to be seen in the ER. Left message for daughter to call back if they needed to discuss further.

## 2024-03-16 NOTE — Telephone Encounter (Signed)
 Patient daughter called and stated that she was expecting a call back from us  today regarding her mother and what was needed to be done since she was vomiting up bile during her CT scan. Patient daughter is requesting a call back at [phone]. Please advise.

## 2024-03-22 DIAGNOSIS — R112 Nausea with vomiting, unspecified: Secondary | ICD-10-CM | POA: Diagnosis not present

## 2024-03-22 NOTE — Telephone Encounter (Signed)
 Patient daughter called and stated that her mother was not able to continue taking the Zofran  and they went over to the emergency room. Patient daughter stated that her mother's PCP called and advised her that she would have to have an EGD done if patient is unable to do the x-ray. Patient daughter is requesting a call back to further discuss her mother's next steps. A good call back number is [phone]. Please advise.

## 2024-03-23 NOTE — Telephone Encounter (Signed)
 Pts daughter states mother went for barium swallow and could not do the test. Reports she vomited upthe black bile and xray got worried and sent her to the er. She was told to take zofran  every 6 hours and carafate  qid. Her PCP Dr Alcide Aly was notified that she had been in the er and called them and urged her to call the office to see if she could have an egd done, stated he didn't want her to be on all the zofran  long term. Please advise.

## 2024-03-24 NOTE — Telephone Encounter (Signed)
 Her failure to complete the barium swallow was consistent with an anxiety disorder. Okay to set up EGD to rule out other possibilities. Laverta Potters to set up EGD in [facility] with me. Thanks, Dr. Elvin Hammer

## 2024-03-24 NOTE — Telephone Encounter (Signed)
 Pt scheduled for telephone previsit 03/30/24@2pm . EGD scheduled in the [facility] 04/15/24@10 :30am. Pts daughter aware of appts.

## 2024-03-30 ENCOUNTER — Ambulatory Visit (AMBULATORY_SURGERY_CENTER)

## 2024-03-30 VITALS — Ht 61.5 in | Wt 138.0 lb

## 2024-03-30 DIAGNOSIS — K2100 Gastro-esophageal reflux disease with esophagitis, without bleeding: Secondary | ICD-10-CM

## 2024-03-30 DIAGNOSIS — R112 Nausea with vomiting, unspecified: Secondary | ICD-10-CM

## 2024-03-30 DIAGNOSIS — R1310 Dysphagia, unspecified: Secondary | ICD-10-CM

## 2024-03-30 NOTE — Progress Notes (Signed)
 No egg or soy allergy known to patient  No issues known to pt with past sedation with any surgeries or procedures Patient denies ever being told they had issues or difficulty with intubation  No FH of Malignant Hyperthermia Pt is not on diet pills Pt is not on  home 02  Pt is not on blood thinners  Pt has issues with constipation but takes stool softner and a fiber capsule  No A fib or A flutter Have any cardiac testing pending--no Pt can ambulate with walker Pt denies use of chewing tobacco Discussed diabetic I weight loss medication holds Discussed NSAID holds Checked BMI Pt instructed to use Single[employer] or GoodRx for a price reduction on prep  Patient's chart reviewed by Rogena Class CNRA prior to previsit and patient appropriate for the [facility].  Pre visit completed and red dot placed by patient's name on their procedure day (on provider's schedule).

## 2024-04-13 DIAGNOSIS — M7061 Trochanteric bursitis, right hip: Secondary | ICD-10-CM | POA: Diagnosis not present

## 2024-04-13 DIAGNOSIS — M5416 Radiculopathy, lumbar region: Secondary | ICD-10-CM | POA: Diagnosis not present

## 2024-04-15 ENCOUNTER — Ambulatory Visit: Admitting: Internal Medicine

## 2024-04-15 ENCOUNTER — Encounter: Payer: Self-pay | Admitting: Internal Medicine

## 2024-04-15 VITALS — BP 124/76 | HR 87 | Temp 97.3°F | Resp 17 | Ht 61.5 in | Wt 138.0 lb

## 2024-04-15 DIAGNOSIS — R112 Nausea with vomiting, unspecified: Secondary | ICD-10-CM | POA: Diagnosis not present

## 2024-04-15 DIAGNOSIS — K2100 Gastro-esophageal reflux disease with esophagitis, without bleeding: Secondary | ICD-10-CM

## 2024-04-15 DIAGNOSIS — Z980 Intestinal bypass and anastomosis status: Secondary | ICD-10-CM

## 2024-04-15 DIAGNOSIS — R1310 Dysphagia, unspecified: Secondary | ICD-10-CM | POA: Diagnosis not present

## 2024-04-15 MED ORDER — SODIUM CHLORIDE 0.9 % IV SOLN: 500.0000 mL | Freq: Once | INTRAVENOUS | Status: DC

## 2024-04-15 NOTE — Patient Instructions (Signed)
 Resume previous diet Continue present medications, stop taking CARAFATE , Keep taking Protonix  (Pantoprazole  40 mg twice daily) Await pathology results  Handouts/information given for GERD  YOU HAD AN ENDOSCOPIC PROCEDURE TODAY AT THE [facility]:   Refer to the procedure report that was given to you for any specific questions about what was found during the examination.  If the procedure report does not answer your questions, please call your gastroenterologist to clarify.  If you requested that your care partner not be given the details of your procedure findings, then the procedure report has been included in a sealed envelope for you to review at your convenience later.  YOU SHOULD EXPECT: Some feelings of bloating in the abdomen. Passage of more gas than usual.  Walking can help get rid of the air that was put into your GI tract during the procedure and reduce the bloating. If you had a lower endoscopy (such as a colonoscopy or flexible sigmoidoscopy) you may notice spotting of blood in your stool or on the toilet paper. If you underwent a bowel prep for your procedure, you may not have a normal bowel movement for a few days.  Please Note:  You might notice some irritation and congestion in your nose or some drainage.  This is from the oxygen used during your procedure.  There is no need for concern and it should clear up in a day or so.  SYMPTOMS TO REPORT IMMEDIATELY:  Following upper endoscopy (EGD)  Vomiting of blood or coffee ground material  New chest pain or pain under the shoulder blades  Painful or persistently difficult swallowing  New shortness of breath  Fever of 100F or higher  Black, tarry-looking stools For urgent or emergent issues, a gastroenterologist can be reached at any hour by calling [phone]. Do not use MyChart messaging for urgent concerns.   DIET:  We do recommend a small meal at first, but then you may proceed to your regular diet.  Drink  plenty of fluids but you should avoid alcoholic beverages for 24 hours.  ACTIVITY:  You should plan to take it easy for the rest of today and you should NOT DRIVE or use heavy machinery until tomorrow (because of the sedation medicines used during the test).    FOLLOW UP: Our staff will call the number listed on your records the next business day following your procedure.  We will call around 7:15- 8:00 am to check on you and address any questions or concerns that you may have regarding the information given to you following your procedure. If we do not reach you, we will leave a message.     If any biopsies were taken you will be contacted by phone or by letter within the next 1-3 weeks.  Please call us  at [phone] if you have not heard about the biopsies in 3 weeks.    SIGNATURES/CONFIDENTIALITY: You and/or your care partner have signed paperwork which will be entered into your electronic medical record.  These signatures attest to the fact that that the information above on your After Visit Summary has been reviewed and is understood.  Full responsibility of the confidentiality of this discharge information lies with you and/or your care-partner.

## 2024-04-15 NOTE — Progress Notes (Signed)
 Expand All Collapse All     Chief Complaint: Dysphagia and anxiety induced vomiting Primary GI MD: Dr. Abran   HPI: 83 year old female history of dementia, hypertension, cholelithiasis, constipation, presents for evaluation of dysphagia referred by her PCP   History of constipation last seen by Dr. Abran 2022   Discussed the use of AI scribe software for clinical note transcription with the patient, who gave verbal consent to proceed.   History of Present Illness She experiences episodes of vomiting, particularly when she becomes anxious about going out. This anxiety can occur in various situations, such as attending a doctor's appointment or social events. Her daughter notes that as her anxiety rises, she often vomits, and sometimes she has to cancel plans. On the past Wednesday, she vomited black bile, which was alarming as it was a new occurrence. She had been vomiting intermittently since early that morning.   She has a history of gastroesophageal reflux disease (GERD) and experiences frequent heartburn. She takes pantoprazole  once daily and uses Gas-X frequently, which she finds helpful. Despite this, she still experiences breakthrough symptoms. She also reports occasional difficulty swallowing, which can occur with both solids and liquids, and is sometimes worse when taking her medications. An endoscopy in 2016 showed esophagitis.   She has been taking Zofran , particularly since her father's passing, to help manage her nausea and anxiety. She uses it at night to help her sleep and sometimes when she is anxious about going out.    She has seen a cardiologist and had a full physical recently, both of which were reported as normal. She has a history of scoliosis, which causes back pain, especially when sitting for long periods.     PREVIOUS GI WORKUP    Colonoscopy 06/2020 - Diverticulosis and benign scattered erosions, biopsies negative - Otherwise normal   EGD 02/2015 1. GERD with  mild esophagitis  2. Prior gastric surgery, presumably from ulcer disease       Past Medical History:  Diagnosis Date   Anxiety      on meds   Arthritis      OA bil shoulders, hands, back   Back pain     BPPV (benign paroxysmal positional vertigo) 10/2019   Bursitis     Colon polyps      adenomatous   Colon polyps     Depression      on meds   Diverticulosis     Frequent falls 03/03/2021   GERD (gastroesophageal reflux disease)      on meds   Hemorrhoids     Hyperlipidemia      on meds   Mild hypertension      white coat syndrome- on meds   Myocardial infarction (HCC)     OAB (overactive bladder)     Osteopenia     Panic disorder 11/2012   Plantar fasciitis      hx of   PUD (peptic ulcer disease)     Shingles     Stroke (HCC)     Takotsubo cardiomyopathy     Tremor      mild action tremor suspected 2020   Vitamin D  deficiency      on meds               Past Surgical History:  Procedure Laterality Date   ABDOMINAL HYSTERECTOMY       APPENDECTOMY       BACK SURGERY       COLONOSCOPY   2016  [id number]   FOOT SURGERY        bilateral    HEMORRHOID SURGERY       JOINT REPLACEMENT Right 2014   laproscopic knee surgery Right      2018   LEFT HEART CATH AND CORONARY ANGIOGRAPHY N/A 09/28/2020    Procedure: LEFT HEART CATH AND CORONARY ANGIOGRAPHY;  Surgeon: Wonda Sharper, MD;  Location: [facility] INVASIVE CV LAB;  Service: Cardiovascular;  Laterality: N/A;   LUMBAR LAMINECTOMY/DECOMPRESSION MICRODISCECTOMY   05/07/2012    Procedure: LUMBAR LAMINECTOMY/DECOMPRESSION MICRODISCECTOMY 1 LEVEL;  Surgeon: Alm GORMAN Molt, MD;  Location: [facility] NEURO ORS;  Service: Neurosurgery;  Laterality: Right;  Lumbar Laminectomy Decompression Microdiscectomy Lumbar Three-Four   multiple foot surgeries       POLYPECTOMY   2016    TA   REVERSE SHOULDER ARTHROPLASTY Left 07/12/2020    Procedure: REVERSE SHOULDER ARTHROPLASTY;  Surgeon: Cristy Bonner DASEN, MD;  Location: [facility];  Service: Orthopedics;  Laterality: Left;   ROTATOR CUFF REPAIR        right side   TONSILLECTOMY       TOTAL HIP ARTHROPLASTY   11/16/2012    Procedure: TOTAL HIP ARTHROPLASTY;  Surgeon: Dempsey LULLA Moan, MD;  Location: [facility] ORS;  Service: Orthopedics;  Laterality: Right;  Right Total Hip Arthroplasty   TOTAL KNEE ARTHROPLASTY Right 03/20/2021    Procedure: TOTAL KNEE ARTHROPLASTY;  Surgeon: Beverley Evalene BIRCH, MD;  Location: [facility] ORS;  Service: Orthopedics;  Laterality: Right;   TUBAL LIGATION       VAGOTOMY        approx. 35 years ago, states has a clamp mid epigastric area                Current Outpatient Medications  Medication Sig Dispense Refill   acetaminophen  (TYLENOL ) 500 MG tablet Take 2 tablets (1,000 mg total) by mouth every 6 (six) hours as needed for mild pain or moderate pain. 60 tablet 0   buPROPion  (WELLBUTRIN  XL) 300 MG 24 hr tablet Take 300 mg by mouth daily before breakfast.        carvedilol  (COREG ) 12.5 MG tablet Take 1 tablet (12.5 mg total) by mouth 2 (two) times daily with a meal. 180 tablet 2   Cholecalciferol  (VITAMIN D -3) 125 MCG (5000 UT) TABS Take 5,000 Units by mouth daily after breakfast.       cyclobenzaprine  (FLEXERIL ) 5 MG tablet Take 5 mg by mouth 2 (two) times daily.       donepezil  (ARICEPT ) 10 MG tablet Take 10 mg by mouth at bedtime.       DULoxetine  (CYMBALTA ) 60 MG capsule Take 60 mg by mouth daily.       ezetimibe  (ZETIA ) 10 MG tablet Take 10 mg by mouth daily.       gabapentin  (NEURONTIN ) 300 MG capsule TAKE 1 CAPSULE(300 MG) BY MOUTH AT BEDTIME (Patient taking differently: Take 300 mg by mouth at bedtime.) 30 capsule 3   LORazepam  (ATIVAN ) 0.5 MG tablet Take 0.5 mg by mouth every 8 (eight) hours as needed for anxiety.       memantine (NAMENDA) 10 MG tablet Take 10 mg by mouth 2 (two) times daily.       MYRBETRIQ  50 MG TB24 tablet Take 50 mg by mouth daily.       ondansetron  (ZOFRAN -ODT) 8 MG disintegrating tablet Take 8 mg by mouth as  needed.       pantoprazole  (PROTONIX ) 40 MG tablet Take 1 tablet (  40 mg total) by mouth 2 (two) times daily. 60 tablet 2   raloxifene  (EVISTA ) 60 MG tablet Take 60 mg by mouth daily.       rosuvastatin  (CRESTOR ) 20 MG tablet Take 1 tablet by mouth daily.       vitamin B-12 (CYANOCOBALAMIN ) 1000 MCG tablet Take 1,000 mcg by mouth daily.          No current facility-administered medications for this visit.             Allergies as of 02/23/2024 - Review Complete 04/14/2023  Allergen Reaction Noted   Zocor  [simvastatin ]   02/23/2024   Zolpidem    01/31/2014           Family History  Problem Relation Age of Onset   Colon polyps Mother     Stroke Mother     Hypertension Mother     Congestive Heart Failure Mother     Stroke Other          several family members on mother's side   Lung cancer Father 44   Heart disease Father     Colon cancer Neg Hx     Rectal cancer Neg Hx     Stomach cancer Neg Hx     Esophageal cancer Neg Hx            Social History         Socioeconomic History   Marital status: Widowed      Spouse name: Not on file   Number of children: 3   Years of education: cosmetology degree   Highest education level: GED or equivalent  Occupational History   Occupation: Retired  Tobacco Use   Smoking status: Former      Current packs/day: 0.00      Average packs/day: 1 pack/day for 20.0 years (20.0 ttl pk-yrs)      Types: Cigarettes      Start date: 1963      Quit date: 1983      Years since quitting: 42.3   Smokeless tobacco: Never   Tobacco comments:      45 years ago  Vaping Use   Vaping status: Never Used  Substance and Sexual Activity   Alcohol  use: Yes      Alcohol /week: 2.0 - 4.0 standard drinks of alcohol       Types: 2 - 4 Glasses of wine per week      Comment: every 3-4 days   Drug use: No   Sexual activity: Not Currently      Partners: Male  Other Topics Concern   Not on file  Social History Narrative    Lives alone    Right handed     Social Drivers of Health    Financial Resource Strain: Not on file  Food Insecurity: Not on file  Transportation Needs: Not on file  Physical Activity: Not on file  Stress: Not on file  Social Connections: Not on file  Intimate Partner Violence: Not on file      Review of Systems:    Constitutional: No weight loss, fever, chills, weakness or fatigue HEENT: Eyes: No change in vision               Ears, Nose, Throat:  No change in hearing or congestion Skin: No rash or itching Cardiovascular: No chest pain, chest pressure or palpitations   Respiratory: No SOB or cough Gastrointestinal: See HPI and otherwise negative Genitourinary: No dysuria or change in urinary frequency Neurological:  No headache, dizziness or syncope Musculoskeletal: No new muscle or joint pain Hematologic: No bleeding or bruising Psychiatric: No history of depression or anxiety      Physical Exam:  Vital signs: BP 110/60 (BP Location: Left Arm, Patient Position: Sitting, Cuff Size: Normal)   Pulse 84   Ht 5' 1.5 (1.562 m) Comment: height measured without shoes  Wt 138 lb (62.6 kg)   BMI 25.65 kg/m    Constitutional: NAD, alert and cooperative Head:  Normocephalic and atraumatic. Eyes:   PEERL, EOMI. No icterus. Conjunctiva pink. Respiratory: Respirations even and unlabored. Lungs clear to auscultation bilaterally.   No wheezes, crackles, or rhonchi.  Cardiovascular:  Regular rate and rhythm. No peripheral edema, cyanosis or pallor.  Gastrointestinal:  Soft, nondistended, nontender. No rebound or guarding. Normal bowel sounds. No appreciable masses or hepatomegaly. Rectal:  Declines Msk:  Symmetrical without gross deformities. Without edema, no deformity or joint abnormality.  Neurologic:  Alert and  oriented x4;  grossly normal neurologically.  Skin:   Dry and intact without significant lesions or rashes. Psychiatric: Oriented to person, place and time. Demonstrates good judgement and reason  without abnormal affect or behaviors.   RELEVANT LABS AND IMAGING: CBC Labs (Brief)          Component Value Date/Time    WBC 5.0 03/08/2021 1429    RBC 3.94 03/08/2021 1429    HGB 12.1 03/08/2021 1429    HCT 39.3 03/08/2021 1429    PLT 209 03/08/2021 1429    MCV 99.7 03/08/2021 1429    MCH 30.7 03/08/2021 1429    MCHC 30.8 03/08/2021 1429    RDW 14.5 03/08/2021 1429    LYMPHSABS 1.5 09/28/2020 1831    MONOABS 0.6 09/28/2020 1831    EOSABS 0.0 09/28/2020 1831    BASOSABS 0.0 09/28/2020 1831        CMP     Labs (Brief)          Component Value Date/Time    NA 143 03/08/2021 1429    NA 143 10/25/2020 0000    K 4.3 03/08/2021 1429    CL 108 03/08/2021 1429    CO2 31 03/08/2021 1429    GLUCOSE 100 (H) 03/08/2021 1429    BUN 24 (H) 03/08/2021 1429    BUN 12 10/25/2020 0000    CREATININE 0.72 03/08/2021 1429    CALCIUM  9.0 03/08/2021 1429    PROT 5.9 (L) 10/01/2020 1004    ALBUMIN 2.9 (L) 10/01/2020 1004    AST 34 10/01/2020 1004    ALT 17 10/01/2020 1004    ALKPHOS 69 10/01/2020 1004    BILITOT 0.9 10/01/2020 1004    GFRNONAA >60 03/08/2021 1429    GFRAA 82 10/25/2020 0000          Assessment/Plan:    Dysphagia GERD with esophagitis Coffee ground emesis EGD 2016 for GERD showed reflux esophagitis and prior gastric surgery presumably related to ulcer disease.  Normal CBC/CMP recently.  Dysphagia to solids, liquids, pills.  Suspect dysmotility versus stricture versus esophagitis.  GERD not well-controlled on PPI once daily.  1 episode of coffee-ground emesis likely from esophagitis.  Diet high in acidic foods.  With multiple comorbidities and age patient is high risk for endoscopic procedures. - Increase PPI to twice daily - Educated patient on lifestyle modifications and provided patient education handouts - CBC to rule out anemia - Barium swallow tablet to evaluate for motility versus stricture - If profuse coffee-ground emesis, bleeding, shortness of breath,  chest pain, syncope please proceed to ED - Follow-up 8 to 12 weeks   Anxiety Nausea and vomiting Nausea and vomiting induced by anxiety especially when going out places.  PCP managing anxiety.  Suspect from recurrent vomiting she likely has esophagitis resulting in her worsening GERD and episode of coffee-ground emesis. - Continue Zofran  to help manage nausea and vomiting - Continue to follow with PCP regarding anxiety - Increase PPI to twice daily manage suspected esophagitis   Takotsubo cardiomyopathy Echocardiogram April 2023 with EF 70 to 75%   Nestor Mollie RIGGERS [facility] 02/23/2024, 11:49 AM   Cc: Shayne Anes, MD    Recent PE as above.  No interval clinical change.  Barium swallow with limitations as described.  Now for endoscopy

## 2024-04-15 NOTE — Progress Notes (Signed)
 Pt's states no medical or surgical changes since previsit or office visit.

## 2024-04-15 NOTE — Op Note (Signed)
 [facility] Patient Name: Colleen Glenn Procedure Date: 04/15/2024 12:33 PM MRN: [id number] Endoscopist: Norleen SAILOR. Abran , MD, [id number] Age: 83 Referring MD:  Date of Birth: [birth year] Gender: Female Account #: [id number] Procedure:                Upper GI endoscopy Indications:              Nausea with vomiting Medicines:                Monitored Anesthesia Care Procedure:                Pre-Anesthesia Assessment:                           - Prior to the procedure, a History and Physical                            was performed, and patient medications and                            allergies were reviewed. The patient's tolerance of                            previous anesthesia was also reviewed. The risks                            and benefits of the procedure and the sedation                            options and risks were discussed with the patient.                            All questions were answered, and informed consent                            was obtained. Prior Anticoagulants: The patient has                            taken no anticoagulant or antiplatelet agents. ASA                            Grade Assessment: II - A patient with mild systemic                            disease. After reviewing the risks and benefits,                            the patient was deemed in satisfactory condition to                            undergo the procedure.                           After obtaining informed consent, the endoscope was  passed under direct vision. Throughout the                            procedure, the patient's blood pressure, pulse, and                            oxygen saturations were monitored continuously. The                            Olympus Scope [id number] was introduced through the                            mouth, and advanced to the second part of duodenum.                            The upper GI endoscopy was  accomplished without                            difficulty. The patient tolerated the procedure                            well. Scope In: Scope Out: Findings:                 The esophagus was normal.                           The stomach revealed evidence of prior surgery                            consistent with gastrojejunostomy for ulcer                            disease. The anastomosis was widely patent. The                            mucosa was intact throughout.                           The examined duodenum was normal.                           The cardia and gastric fundus were normal on                            retroflexion. Complications:            No immediate complications. Estimated Blood Loss:     Estimated blood loss: none. Impression:               1. Normal postoperative stomach                           2. Problems with nausea and vomiting may be related                            to altered anatomy, GERD, or anxiety.  Recommendation:           - Patient has a contact number available for                            emergencies. The signs and symptoms of potential                            delayed complications were discussed with the                            patient. Return to normal activities tomorrow.                            Written discharge instructions were provided to the                            patient.                           - Resume previous diet. Recommend eating smaller                            portions more frequently to assist with digestion                            given the anatomic changes from prior surgery.                           - Continue present medications.                           - Confirm that the patient is taking pantoprazole                             40 mg twice daily                           - Okay to discontinue Carafate  (sucralfate ).                           - Return to the care of Dr. Perini Markell Sciascia N.  Abran, MD 04/15/2024 1:01:57 PM This report has been signed electronically.

## 2024-04-16 ENCOUNTER — Telehealth: Payer: Self-pay | Admitting: *Deleted

## 2024-04-16 NOTE — Telephone Encounter (Signed)
  Follow up Call-     04/15/2024   11:06 AM  Call back number  Post procedure Call Back phone  # [phone], Jerel, daughter  Permission to leave phone message Yes     Patient questions:  Do you have a fever, pain , or abdominal swelling? No. Pain Score  0 *  Have you tolerated food without any problems? Yes.    Have you been able to return to your normal activities? Yes.    Do you have any questions about your discharge instructions: Diet   No. Medications  No. Follow up visit  No.  Do you have questions or concerns about your Care? No.  Actions: * If pain score is 4 or above: No action needed, pain <4.

## 2024-04-21 ENCOUNTER — Ambulatory Visit: Admitting: Nurse Practitioner

## 2024-05-11 DIAGNOSIS — M5416 Radiculopathy, lumbar region: Secondary | ICD-10-CM | POA: Diagnosis not present

## 2024-08-02 ENCOUNTER — Other Ambulatory Visit: Payer: Self-pay | Admitting: Gastroenterology

## 2024-08-02 DIAGNOSIS — R1310 Dysphagia, unspecified: Secondary | ICD-10-CM

## 2024-08-10 DIAGNOSIS — Z23 Encounter for immunization: Secondary | ICD-10-CM | POA: Diagnosis not present

## 2024-08-10 DIAGNOSIS — I10 Essential (primary) hypertension: Secondary | ICD-10-CM | POA: Diagnosis not present

## 2024-08-10 DIAGNOSIS — R7301 Impaired fasting glucose: Secondary | ICD-10-CM | POA: Diagnosis not present

## 2024-08-12 DIAGNOSIS — M5416 Radiculopathy, lumbar region: Secondary | ICD-10-CM | POA: Diagnosis not present

## 2024-08-12 DIAGNOSIS — M25551 Pain in right hip: Secondary | ICD-10-CM | POA: Diagnosis not present

## 2024-08-19 DIAGNOSIS — M5416 Radiculopathy, lumbar region: Secondary | ICD-10-CM | POA: Diagnosis not present

## 2024-09-13 DIAGNOSIS — H25813 Combined forms of age-related cataract, bilateral: Secondary | ICD-10-CM | POA: Diagnosis not present

## 2024-09-13 DIAGNOSIS — H1013 Acute atopic conjunctivitis, bilateral: Secondary | ICD-10-CM | POA: Diagnosis not present

## 2024-09-13 DIAGNOSIS — H40033 Anatomical narrow angle, bilateral: Secondary | ICD-10-CM | POA: Diagnosis not present

## 2024-09-13 DIAGNOSIS — H35033 Hypertensive retinopathy, bilateral: Secondary | ICD-10-CM | POA: Diagnosis not present

## 2024-10-02 ENCOUNTER — Other Ambulatory Visit: Payer: Self-pay | Admitting: Cardiovascular Disease

## 2024-10-28 ENCOUNTER — Emergency Department (HOSPITAL_COMMUNITY)
Admission: EM | Admit: 2024-10-28 | Discharge: 2024-10-28 | Disposition: A | Discharge status: Home / Self Care | Attending: Emergency Medicine | Admitting: Emergency Medicine

## 2024-10-28 ENCOUNTER — Other Ambulatory Visit: Payer: Self-pay

## 2024-10-28 ENCOUNTER — Emergency Department (HOSPITAL_COMMUNITY)

## 2024-10-28 ENCOUNTER — Encounter (HOSPITAL_COMMUNITY): Payer: Self-pay

## 2024-10-28 DIAGNOSIS — S20219A Contusion of unspecified front wall of thorax, initial encounter: Secondary | ICD-10-CM | POA: Diagnosis not present

## 2024-10-28 DIAGNOSIS — S8001XA Contusion of right knee, initial encounter: Secondary | ICD-10-CM | POA: Insufficient documentation

## 2024-10-28 DIAGNOSIS — W0110XA Fall on same level from slipping, tripping and stumbling with subsequent striking against unspecified object, initial encounter: Secondary | ICD-10-CM | POA: Insufficient documentation

## 2024-10-28 DIAGNOSIS — S0083XA Contusion of other part of head, initial encounter: Secondary | ICD-10-CM | POA: Diagnosis not present

## 2024-10-28 DIAGNOSIS — M25561 Pain in right knee: Secondary | ICD-10-CM | POA: Diagnosis present

## 2024-10-28 DIAGNOSIS — Z23 Encounter for immunization: Secondary | ICD-10-CM | POA: Insufficient documentation

## 2024-10-28 DIAGNOSIS — W19XXXA Unspecified fall, initial encounter: Secondary | ICD-10-CM

## 2024-10-28 HISTORY — DX: Unspecified dementia, unspecified severity, without behavioral disturbance, psychotic disturbance, mood disturbance, and anxiety: F03.90

## 2024-10-28 LAB — COMPREHENSIVE METABOLIC PANEL WITH GFR
ALT: 14 U/L (ref 0–44)
AST: 34 U/L (ref 15–41)
Albumin: 3.6 g/dL (ref 3.5–5.0)
Alkaline Phosphatase: 65 U/L (ref 38–126)
Anion gap: 10 (ref 5–15)
BUN: 22 mg/dL (ref 8–23)
CO2: 24 mmol/L (ref 22–32)
Calcium: 9.1 mg/dL (ref 8.9–10.3)
Chloride: 107 mmol/L (ref 98–111)
Creatinine, Ser: 0.78 mg/dL (ref 0.44–1.00)
GFR, Estimated: >60 mL/min
Glucose, Bld: 96 mg/dL (ref 70–99)
Potassium: 5.1 mmol/L (ref 3.5–5.1)
Sodium: 141 mmol/L (ref 135–145)
Total Bilirubin: 0.2 mg/dL (ref 0.0–1.2)
Total Protein: 6.1 g/dL — ABNORMAL LOW (ref 6.5–8.1)

## 2024-10-28 LAB — CBC
HCT: 40.4 % (ref 36.0–46.0)
Hemoglobin: 12.4 g/dL (ref 12.0–15.0)
MCH: 31.7 pg (ref 26.0–34.0)
MCHC: 30.7 g/dL (ref 30.0–36.0)
MCV: 103.3 fL — ABNORMAL HIGH (ref 80.0–100.0)
Platelets: 187 K/uL (ref 150–400)
RBC: 3.91 MIL/uL (ref 3.87–5.11)
RDW: 14.8 % (ref 11.5–15.5)
WBC: 6.7 K/uL (ref 4.0–10.5)
nRBC: 0 % (ref 0.0–0.2)

## 2024-10-28 MED ORDER — TETANUS-DIPHTH-ACELL PERTUSSIS 5-2-15.5 LF-MCG/0.5 IM SUSP
0.5000 mL | Freq: Once | INTRAMUSCULAR | Status: AC
  Administered 2024-10-28: 0.5 mL via INTRAMUSCULAR
  Filled 2024-10-28: qty 0.5

## 2024-10-28 NOTE — Discharge Instructions (Signed)
Tylenol or Motrin for pain and follow-up with your doctor if any problems

## 2024-10-28 NOTE — ED Notes (Signed)
 Patient transported to X-ray via wheelchair in NAD.

## 2024-10-28 NOTE — ED Notes (Signed)
 Pt up to bathroom with assistance from her daughter. Utilized her walker. Pt. In NAD.

## 2024-10-28 NOTE — ED Triage Notes (Signed)
 Pt BIB daughter for fall 2 hours PTA. Pt is unsteady on feet and forgets to use her walker. Frequent falls. No thinners. Pt complains of Lac to R forehead (bleeding controlled) and R knee pain. No LOC.

## 2024-10-31 NOTE — ED Provider Notes (Signed)
 "  EMERGENCY DEPARTMENT AT [facility] Provider Note   CSN: [id number] Arrival date & time: 10/28/24  1513     Patient presents with: Colleen Glenn is a 84 y.o. female.   Patient had a fall and hit her head and complains of right knee pain.  She also has some chest discomfort that occurred from a previous fall  The history is provided by the patient and medical records. No language interpreter was used.  Fall This is a new problem. The current episode started 6 to 12 hours ago. The problem occurs constantly. The problem has been resolved. Associated symptoms include chest pain. Pertinent negatives include no abdominal pain and no headaches. Nothing aggravates the symptoms. Nothing relieves the symptoms.       Prior to Admission medications  Medication Sig Start Date End Date Taking? Authorizing Provider  acetaminophen  (TYLENOL ) 500 MG tablet Take 2 tablets (1,000 mg total) by mouth every 6 (six) hours as needed for mild pain or moderate pain. 03/21/21   Gawne, Meghan M, PA-C  buPROPion  (WELLBUTRIN  XL) 300 MG 24 hr tablet Take 300 mg by mouth daily before breakfast.     [provider]  carvedilol  (COREG ) 12.5 MG tablet Take 1 tablet (12.5 mg total) by mouth 2 (two) times daily with a meal. 10/05/24   Wonda Sharper, MD  Cholecalciferol  (VITAMIN D -3) 125 MCG (5000 UT) TABS Take 5,000 Units by mouth daily after breakfast.    [provider]  cyclobenzaprine  (FLEXERIL ) 5 MG tablet Take 5 mg by mouth 2 (two) times daily. 04/10/22   [provider]  donepezil  (ARICEPT ) 10 MG tablet Take 10 mg by mouth at bedtime.    [provider]  DULoxetine  (CYMBALTA ) 60 MG capsule Take 60 mg by mouth daily. 10/25/19   [provider]  ezetimibe  (ZETIA ) 10 MG tablet Take 10 mg by mouth daily. 11/18/19   [provider]  LORazepam  (ATIVAN ) 0.5 MG tablet Take 0.5 mg by mouth every 8 (eight) hours as needed for anxiety.     [provider]  memantine (NAMENDA) 10 MG tablet Take 10 mg by mouth 2 (two) times daily. 03/01/22   [provider]  MYRBETRIQ  50 MG TB24 tablet Take 50 mg by mouth daily. 03/06/20   [provider]  ondansetron  (ZOFRAN -ODT) 8 MG disintegrating tablet Take 8 mg by mouth as needed. 01/27/23   [provider]  pantoprazole  (PROTONIX ) 40 MG tablet Take 1 tablet (40 mg total) by mouth 2 (two) times daily. 08/02/24   McMichael, Nestor HERO, PA-C  raloxifene  (EVISTA ) 60 MG tablet Take 60 mg by mouth daily.    [provider]  rosuvastatin  (CRESTOR ) 20 MG tablet Take 1 tablet by mouth daily.    [provider]  vitamin B-12 (CYANOCOBALAMIN ) 1000 MCG tablet Take 1,000 mcg by mouth daily.    [provider]    Allergies: Zolpidem  and Zocor  [simvastatin ]    Review of Systems  Constitutional:  Negative for appetite change and fatigue.  HENT:  Negative for congestion, ear discharge and sinus pressure.        Headache  Eyes:  Negative for discharge.  Respiratory:  Negative for cough.   Cardiovascular:  Positive for chest pain.  Gastrointestinal:  Negative for abdominal pain and diarrhea.  Genitourinary:  Negative for frequency and hematuria.  Musculoskeletal:  Negative for back pain.       Right knee pain  Skin:  Negative for  rash.  Neurological:  Negative for seizures and headaches.  Psychiatric/Behavioral:  Negative for hallucinations.     Updated Vital Signs BP (!) 118/58 (BP Location: Right Arm)   Pulse 68   Temp 97.8 F (36.6 C) (Oral)   Resp 18   Ht 5' 1.5 (1.562 m)   Wt 62.6 kg   SpO2 97%   BMI 25.65 kg/m   Physical Exam Vitals and nursing note reviewed.  Constitutional:      Appearance: She is well-developed.  HENT:     Head: Normocephalic.     Comments: Tender forehead    Nose: Nose normal.  Eyes:     General: No scleral icterus.    Conjunctiva/sclera: Conjunctivae normal.  Neck:     Thyroid : No thyromegaly.   Cardiovascular:     Rate and Rhythm: Normal rate and regular rhythm.     Heart sounds: No murmur heard.    No friction rub. No gallop.  Pulmonary:     Breath sounds: No stridor. No wheezing or rales.  Chest:     Chest wall: Tenderness present.  Abdominal:     General: There is no distension.     Tenderness: There is no abdominal tenderness. There is no rebound.  Musculoskeletal:     Cervical back: Neck supple.     Comments: Tender right knee  Lymphadenopathy:     Cervical: No cervical adenopathy.  Skin:    Findings: No erythema or rash.  Neurological:     Mental Status: She is alert and oriented to person, place, and time.     Motor: No abnormal muscle tone.     Coordination: Coordination normal.  Psychiatric:        Behavior: Behavior normal.     (all labs ordered are listed, but only abnormal results are displayed) Labs Reviewed  COMPREHENSIVE METABOLIC PANEL WITH GFR - Abnormal; Notable for the following components:      Result Value   Total Protein 6.1 (*)    All other components within normal limits  CBC - Abnormal; Notable for the following components:   MCV 103.3 (*)    All other components within normal limits    EKG: None  Radiology: No results found.   Procedures   Medications Ordered in the ED  Tdap (ADACEL ) injection 0.5 mL (0.5 mLs Intramuscular Given 10/28/24 2010)                                    Medical Decision Making Amount and/or Complexity of Data Reviewed Labs: ordered. Radiology: ordered.  Risk Prescription drug management.   Fall with contusion to right knee forehead and chest.  She will take Tylenol  follow-up PCP     Final diagnoses:  Fall, initial encounter  Contusion of face, initial encounter    ED Discharge Orders     None          Suzette Pac, MD 10/31/24 1121  "

## 2024-11-02 ENCOUNTER — Other Ambulatory Visit: Payer: Self-pay | Admitting: Cardiovascular Disease

## 2024-11-02 NOTE — Telephone Encounter (Signed)
 Please call our office to schedule an overdue appointment with Dr. Wonda before anymore refills. [phone]. Thank you 2nd attempt

## 2024-11-10 ENCOUNTER — Other Ambulatory Visit: Payer: Self-pay | Admitting: Cardiovascular Disease

## 2024-11-10 ENCOUNTER — Other Ambulatory Visit: Payer: Self-pay | Admitting: Gastroenterology

## 2024-11-10 DIAGNOSIS — R1310 Dysphagia, unspecified: Secondary | ICD-10-CM

## 2024-11-16 ENCOUNTER — Encounter: Payer: Self-pay | Admitting: Emergency Medicine

## 2024-11-18 ENCOUNTER — Telehealth: Payer: Self-pay | Admitting: Cardiovascular Disease

## 2024-11-18 MED ORDER — CARVEDILOL 12.5 MG PO TABS: 12.5000 mg | ORAL_TABLET | Freq: Two times a day (BID) | ORAL | 0 refills | Status: DC

## 2024-11-18 NOTE — Telephone Encounter (Signed)
" °*  STAT* If patient is at the pharmacy, call can be transferred to refill team.   1. Which medications need to be refilled? (please list name of each medication and dose if known) carvedilol  (COREG ) 12.5 MG tablet    2. Would you like to learn more about the convenience, safety, & potential cost savings by using the [facility]?    3. Are you open to using the [facility] (Type [facility].  4. Which pharmacy/location (including street and city if local pharmacy) is medication to be sent to?  [facility] [place], [place] - [address]     5. Do they need a 30 day or 90 day supply? Refill until scheduled  appt with Kenzie Campbell 01/04/25 "

## 2024-11-18 NOTE — Telephone Encounter (Signed)
 Refills has been sent to the pharmacy.

## 2024-11-20 ENCOUNTER — Other Ambulatory Visit (HOSPITAL_COMMUNITY): Payer: Self-pay

## 2024-11-20 MED ORDER — VALACYCLOVIR HCL 1 G PO TABS
ORAL_TABLET | ORAL | 0 refills | Status: AC
  Filled 2024-11-20: qty 21, 7d supply, fill #0

## 2024-11-25 ENCOUNTER — Encounter: Payer: Self-pay | Admitting: Cardiovascular Disease

## 2024-11-25 ENCOUNTER — Ambulatory Visit: Admitting: Cardiovascular Disease

## 2024-11-25 VITALS — BP 102/46 | HR 95 | Ht 63.0 in | Wt 131.4 lb

## 2024-11-25 DIAGNOSIS — I5181 Takotsubo syndrome: Secondary | ICD-10-CM

## 2024-11-25 DIAGNOSIS — E782 Mixed hyperlipidemia: Secondary | ICD-10-CM

## 2024-11-25 DIAGNOSIS — I10 Essential (primary) hypertension: Secondary | ICD-10-CM | POA: Diagnosis not present

## 2024-11-25 DIAGNOSIS — I2510 Atherosclerotic heart disease of native coronary artery without angina pectoris: Secondary | ICD-10-CM | POA: Diagnosis not present

## 2024-11-25 MED ORDER — CARVEDILOL 6.25 MG PO TABS: 6.2500 mg | ORAL_TABLET | Freq: Two times a day (BID) | ORAL | 3 refills | Status: AC

## 2024-11-25 NOTE — Patient Instructions (Signed)
 Medication Instructions:  DECREASE Carvedilol  (Coreg ) to 6.25 mg twice daily   *If you need a refill on your cardiac medications before your next appointment, please call your pharmacy*  Lab Work: None ordered today. If you have labs (blood work) drawn today and your tests are completely normal, you will receive your results only by: MyChart Message (if you have MyChart) OR A paper copy in the mail If you have any lab test that is abnormal or we need to change your treatment, we will call you to review the results.  Testing/Procedures: None ordered today.  Follow-Up: At [facility], you and your health needs are our priority.  As part of our continuing mission to provide you with exceptional heart care, our providers are all part of one team.  This team includes your primary Cardiologist (physician) and Advanced Practice Providers or APPs (Physician Assistants and Nurse Practitioners) who all work together to provide you with the care you need, when you need it.  Your next appointment:   As needed

## 2024-11-25 NOTE — Assessment & Plan Note (Signed)
 No recurrence since her initial event in 2021.  Continue beta-blocker.  See below for dosing.

## 2024-11-25 NOTE — Progress Notes (Signed)
 " Cardiology Office Note:    Date:  11/25/2024   ID:  Colleen, Glenn 22-Oct-1940, MRN [id number]  PCP:  Shayne Anes, MD   [facility] Providers Cardiologist:  Ozell Fell, MD     Referring MD: Shayne Anes, MD   Chief Complaint  Patient presents with   Follow-up    Takotsubo Syndrome    History of Present Illness:    Colleen Glenn is a 84 y.o. female with a hx of Takotsubo syndrome, presenting for follow-up evaluation. The patient was hospitalized with acute Takotsubo syndrome in 2021. She underwent emergency cardiac catheterization because it appeared she was having a STEMI, but cardiac cath showed nonobstructive CAD and ventriculography showed typical findings of acute Takotsubo syndrome with periapical ballooning. Follow-up echo showed normalization of LV function with normal LVEF and no residual wall motion abnormalities.   Patient is here with her daughter today.  She has no interval complaints of chest pain, chest pressure, or shortness of breath.  She provides limited history and her daughter supplements her history.  No cardiac-related complaints today.   Current Medications: Active Medications[1]   Allergies:   Zolpidem  and Zocor  [simvastatin ]   ROS:   Please see the history of present illness.    All other systems reviewed and are negative.  EKGs/Labs/Other Studies Reviewed:    The following studies were reviewed today: Cardiac Studies & Procedures   ______________________________________________________________________________________________ CARDIAC CATHETERIZATION  CARDIAC CATHETERIZATION 09/28/2020  Conclusion 1.  Severe segmental LV dysfunction, with classic pattern of acute Takotsubo syndrome (LV apical ballooning) 2.  Nonobstructive coronary artery disease with moderate mid LAD stenosis, mild proximal RCA stenosis, and no evidence of high-grade obstruction 3.  Mildly elevated LVEDP  Recommendations: Medical therapy, supportive  care.  Anticipate 48-hour hospitalization as long as no early complications arise.  Repeat echocardiogram in about 3 months to assess for LV recovery.  Treat with a beta-blocker as tolerated.  Findings Coronary Findings Diagnostic  Dominance: Right  Left Main The vessel exhibits minimal luminal irregularities.  Left Anterior Descending There is mild diffuse disease throughout the vessel. Mid LAD lesion is 50% stenosed. The proximal LAD has mild ectasia.  There is a moderate stenosis in the mid LAD without high-grade obstruction.  The LAD reaches the LV apex.  Left Circumflex The vessel exhibits minimal luminal irregularities.  Right Coronary Artery There is mild diffuse disease throughout the vessel. The vessel is moderately ectatic. Large, dominant vessel with no obstructive disease.  There is ectasia in the proximal and mid vessel. Prox RCA lesion is 40% stenosed.  Right Posterior Descending Artery The vessel exhibits minimal luminal irregularities.  First Right Posterolateral Branch The vessel exhibits minimal luminal irregularities.  Intervention  No interventions have been documented.     ECHOCARDIOGRAM  ECHOCARDIOGRAM COMPLETE 01/23/2021  Narrative ECHOCARDIOGRAM REPORT    Patient Name:   Colleen Glenn Date of Exam: 01/23/2021 Medical Rec #:  [id number]           Height:       66.0 in Accession #:    [id number]          Weight:       148.0 lb Date of Birth:  [birth year]           BSA:          1.760 m Patient Age:    79 years            BP:  128/68 mmHg Patient Gender: F                   HR:           82 bpm. Exam Location:  [address]  Procedure: 2D Echo, Cardiac Doppler and Color Doppler  Indications:    I51.81 Takotsubo syndrome  History:        Patient has prior history of Echocardiogram examinations, most recent 09/29/2020. Takotsubo syndrome; Risk Factors:Dyslipidemia and Hypertension.  Sonographer:    Colleen Glenn RDCS Referring  Phys: [id number]   Sonographer Comments: Technically difficult study due to poor echo windows. IMPRESSIONS   1. Left ventricular ejection fraction, by estimation, is 70 to 75%. The left ventricle has hyperdynamic function. The left ventricle has no regional wall motion abnormalities. There is moderate left ventricular hypertrophy. Left ventricular diastolic function could not be evaluated. 2. Right ventricular systolic function is normal. The right ventricular size is normal. There is mildly elevated pulmonary artery systolic pressure. The estimated right ventricular systolic pressure is 40.5 mmHg. 3. Left atrial size was mildly dilated. 4. The mitral valve is normal in structure. No evidence of mitral valve regurgitation. No evidence of mitral stenosis. 5. The aortic valve is tricuspid. There is mild calcification of the aortic valve. There is mild thickening of the aortic valve. Aortic valve regurgitation is trivial. Mild aortic valve stenosis. Aortic valve area, by VTI measures 1.98 cm. Aortic valve mean gradient measures 9.5 mmHg. Aortic valve Vmax measures 2.30 m/s. 6. The inferior vena cava is normal in size with greater than 50% respiratory variability, suggesting right atrial pressure of 3 mmHg.  Comparison(s): The left ventricular function has improved.  FINDINGS Left Ventricle: Left ventricular ejection fraction, by estimation, is 70 to 75%. The left ventricle has hyperdynamic function. The left ventricle has no regional wall motion abnormalities. The left ventricular internal cavity size was normal in size. There is moderate left ventricular hypertrophy. Left ventricular diastolic function could not be evaluated.  Right Ventricle: The right ventricular size is normal. No increase in right ventricular wall thickness. Right ventricular systolic function is normal. There is mildly elevated pulmonary artery systolic pressure. The tricuspid regurgitant velocity is 3.06 m/s,  and with an assumed right atrial pressure of 3 mmHg, the estimated right ventricular systolic pressure is 40.5 mmHg.  Left Atrium: Left atrial size was mildly dilated.  Right Atrium: Right atrial size was normal in size.  Pericardium: There is no evidence of pericardial effusion.  Mitral Valve: The mitral valve is normal in structure. Mild mitral annular calcification. No evidence of mitral valve regurgitation. No evidence of mitral valve stenosis.  Tricuspid Valve: The tricuspid valve is normal in structure. Tricuspid valve regurgitation is mild . No evidence of tricuspid stenosis.  Aortic Valve: The aortic valve is tricuspid. There is mild calcification of the aortic valve. There is mild thickening of the aortic valve. Aortic valve regurgitation is trivial. Mild aortic stenosis is present. Aortic valve mean gradient measures 9.5 mmHg. Aortic valve peak gradient measures 21.1 mmHg. Aortic valve area, by VTI measures 1.98 cm.  Pulmonic Valve: The pulmonic valve was normal in structure. Pulmonic valve regurgitation is not visualized. No evidence of pulmonic stenosis.  Aorta: The aortic root is normal in size and structure.  Venous: The inferior vena cava is normal in size with greater than 50% respiratory variability, suggesting right atrial pressure of 3 mmHg.  IAS/Shunts: No atrial level shunt detected by color flow Doppler.  LEFT VENTRICLE PLAX 2D LVIDd:         4.00 cm  Diastology LVIDs:         2.80 cm  LV e' medial:    6.20 cm/s LV PW:         1.20 cm  LV E/e' medial:  20.0 LV IVS:        1.50 cm  LV e' lateral:   8.27 cm/s LVOT diam:     2.00 cm  LV E/e' lateral: 15.0 LV SV:         88 LV SV Index:   50 LVOT Area:     3.14 cm   RIGHT VENTRICLE             IVC RV S prime:     16.50 cm/s  IVC diam: 1.50 cm TAPSE (M-mode): 2.6 cm RVSP:           40.5 mmHg  LEFT ATRIUM             Index       RIGHT ATRIUM           Index LA diam:        4.10 cm 2.33 cm/m  RA Pressure:  3.00 mmHg LA Vol (A2C):   68.4 ml 38.87 ml/m RA Area:     14.10 cm LA Vol (A4C):   63.8 ml 36.26 ml/m RA Volume:   36.10 ml  20.52 ml/m LA Biplane Vol: 68.3 ml 38.82 ml/m AORTIC VALVE AV Area (Vmax):    1.77 cm AV Area (Vmean):   2.03 cm AV Area (VTI):     1.98 cm AV Vmax:           229.50 cm/s AV Vmean:          144.000 cm/s AV VTI:            0.445 m AV Peak Grad:      21.1 mmHg AV Mean Grad:      9.5 mmHg LVOT Vmax:         129.00 cm/s LVOT Vmean:        92.900 cm/s LVOT VTI:          0.280 m LVOT/AV VTI ratio: 0.63  AORTA Ao Root diam: 3.00 cm Ao Asc diam:  3.60 cm  MV E velocity: 124.00 cm/s  TRICUSPID VALVE MV A velocity: 126.00 cm/s  TR Peak grad:   37.5 mmHg MV E/A ratio:  0.98         TR Vmax:        306.00 cm/s Estimated RAP:  3.00 mmHg RVSP:           40.5 mmHg  SHUNTS Systemic VTI:  0.28 m Systemic Diam: 2.00 cm  Oneil Parchment MD Electronically signed by Oneil Parchment MD Signature Date/Time: 01/23/2021/5:03:21 PM    Final          ______________________________________________________________________________________________      EKG:        Recent Labs: 10/28/2024: ALT 14; BUN 22; Creatinine, Ser 0.78; Hemoglobin 12.4; Platelets 187; Potassium 5.1; Sodium 141  Recent Lipid Panel    Component Value Date/Time   CHOL 188 09/28/2020 1831   TRIG 128 09/28/2020 1831   HDL 44 09/28/2020 1831   CHOLHDL 4.3 09/28/2020 1831   VLDL 26 09/28/2020 1831   LDLCALC 118 (H) 09/28/2020 1831     Risk Assessment/Calculations:                Physical  Exam:    VS:  BP (!) 102/46 (BP Location: Left Arm, Patient Position: Sitting, Cuff Size: Normal)   Pulse 95   Ht 5' 3 (1.6 m)   Wt 131 lb 6.4 oz (59.6 kg)   BMI 23.28 kg/m     Wt Readings from Last 3 Encounters:  11/25/24 131 lb 6.4 oz (59.6 kg)  10/28/24 138 lb 0.1 oz (62.6 kg)  04/15/24 138 lb (62.6 kg)     GEN:  Well nourished, well developed in no acute distress HEENT: Normal NECK: No  JVD; No carotid bruits LYMPHATICS: No lymphadenopathy CARDIAC: RRR, no murmurs, rubs, gallops RESPIRATORY:  Clear to auscultation without rales, wheezing or rhonchi  ABDOMEN: Soft, non-tender, non-distended MUSCULOSKELETAL:  No edema; No deformity  SKIN: Warm and dry NEUROLOGIC:  Alert and oriented x 3 PSYCHIATRIC:  Normal affect   Assessment & Plan Takotsubo syndrome No recurrence since her initial event in 2021.  Continue beta-blocker.  See below for dosing. Coronary artery disease involving native coronary artery of native heart without angina pectoris Patient clinically stable with no chest pain or shortness of breath.  Treated with carvedilol  and rosuvastatin . Essential hypertension Blood pressure has been running lower.  She has lost a lot of weight in the last 2 to 3 years.  I have recommended that she reduce carvedilol  to 6.25 mg twice daily. Mixed hyperlipidemia Treated with rosuvastatin  20 mg.  Labs followed by Dr. Shayne.  The patient is stable from a cardiovascular perspective.  She has had no clinical events since her initial presentation with Takotsubo syndrome in 2021.  I offered to see her back as needed since she has regular follow-up with her primary physician.            Medication Adjustments/Labs and Tests Ordered: Current medicines are reviewed at length with the patient today.  Concerns regarding medicines are outlined above.  No orders of the defined types were placed in this encounter.  Meds ordered this encounter  Medications   carvedilol  (COREG ) 6.25 MG tablet    Sig: Take 1 tablet (6.25 mg total) by mouth 2 (two) times daily with a meal.    Dispense:  90 tablet    Refill:  3    Decreasing to 6.25 mg    Patient Instructions  Medication Instructions:  DECREASE Carvedilol  (Coreg ) to 6.25 mg twice daily   *If you need a refill on your cardiac medications before your next appointment, please call your pharmacy*  Lab Work: None ordered today. If  you have labs (blood work) drawn today and your tests are completely normal, you will receive your results only by: MyChart Message (if you have MyChart) OR A paper copy in the mail If you have any lab test that is abnormal or we need to change your treatment, we will call you to review the results.  Testing/Procedures: None ordered today.  Follow-Up: At [facility], you and your health needs are our priority.  As part of our continuing mission to provide you with exceptional heart care, our providers are all part of one team.  This team includes your primary Cardiologist (physician) and Advanced Practice Providers or APPs (Physician Assistants and Nurse Practitioners) who all work together to provide you with the care you need, when you need it.  Your next appointment:   As needed    Signed, Ozell Fell, MD  11/25/2024 5:10 PM    [facility]     [1]  Current Meds  Medication  Sig   acetaminophen  (TYLENOL ) 500 MG tablet Take 2 tablets (1,000 mg total) by mouth every 6 (six) hours as needed for mild pain or moderate pain.   buPROPion  (WELLBUTRIN  XL) 300 MG 24 hr tablet Take 300 mg by mouth daily before breakfast.    Cholecalciferol  (VITAMIN D -3) 125 MCG (5000 UT) TABS Take 5,000 Units by mouth daily after breakfast.   divalproex (DEPAKOTE SPRINKLE) 125 MG capsule Take 125 mg by mouth 2 (two) times daily.   DULoxetine  (CYMBALTA ) 60 MG capsule Take 60 mg by mouth daily.   LORazepam  (ATIVAN ) 0.5 MG tablet Take 0.5 mg by mouth every 8 (eight) hours as needed for anxiety.   memantine (NAMENDA) 10 MG tablet Take 10 mg by mouth 2 (two) times daily.   MYRBETRIQ  50 MG TB24 tablet Take 50 mg by mouth daily.   ondansetron  (ZOFRAN -ODT) 8 MG disintegrating tablet Take 8 mg by mouth as needed.   pantoprazole  (PROTONIX ) 40 MG tablet Take 1 tablet (40 mg total) by mouth 2 (two) times daily.   QUEtiapine (SEROQUEL) 50 MG tablet Take 50 mg by mouth at bedtime.   raloxifene   (EVISTA ) 60 MG tablet Take 60 mg by mouth daily.   rosuvastatin  (CRESTOR ) 20 MG tablet Take 1 tablet by mouth daily.   valACYclovir  (VALTREX ) 1000 MG tablet Take 1 tablet by mouth 3 times a day   vitamin B-12 (CYANOCOBALAMIN ) 1000 MCG tablet Take 1,000 mcg by mouth daily.   [DISCONTINUED] carvedilol  (COREG ) 12.5 MG tablet Take 1 tablet (12.5 mg total) by mouth 2 (two) times daily with a meal. KEEP OV.   "

## 2025-01-04 ENCOUNTER — Ambulatory Visit: Admitting: Emergency Medicine
